# Patient Record
Sex: Female | Born: 1990 | Race: White | Hispanic: No | Marital: Single | Smoking: Never smoker
Health system: Southern US, Community
[De-identification: ages and names within clinical notes are randomized; demographics above are authoritative.]

## PROBLEM LIST (undated history)

## (undated) ENCOUNTER — Inpatient Hospital Stay: Payer: Self-pay

## (undated) DIAGNOSIS — F319 Bipolar disorder, unspecified: Secondary | ICD-10-CM

## (undated) DIAGNOSIS — F419 Anxiety disorder, unspecified: Secondary | ICD-10-CM

## (undated) DIAGNOSIS — O09299 Supervision of pregnancy with other poor reproductive or obstetric history, unspecified trimester: Secondary | ICD-10-CM

## (undated) DIAGNOSIS — R87612 Low grade squamous intraepithelial lesion on cytologic smear of cervix (LGSIL): Secondary | ICD-10-CM

## (undated) DIAGNOSIS — M1990 Unspecified osteoarthritis, unspecified site: Secondary | ICD-10-CM

## (undated) DIAGNOSIS — F32A Depression, unspecified: Secondary | ICD-10-CM

## (undated) DIAGNOSIS — G709 Myoneural disorder, unspecified: Secondary | ICD-10-CM

## (undated) DIAGNOSIS — M419 Scoliosis, unspecified: Secondary | ICD-10-CM

## (undated) DIAGNOSIS — K219 Gastro-esophageal reflux disease without esophagitis: Secondary | ICD-10-CM

## (undated) DIAGNOSIS — O24419 Gestational diabetes mellitus in pregnancy, unspecified control: Secondary | ICD-10-CM

## (undated) HISTORY — DX: Unspecified osteoarthritis, unspecified site: M19.90

## (undated) HISTORY — DX: Supervision of pregnancy with other poor reproductive or obstetric history, unspecified trimester: O09.299

## (undated) HISTORY — DX: Myoneural disorder, unspecified: G70.9

## (undated) HISTORY — DX: Gestational diabetes mellitus in pregnancy, unspecified control: O24.419

## (undated) HISTORY — DX: Depression, unspecified: F32.A

## (undated) HISTORY — PX: ADENOIDECTOMY: SUR15

## (undated) HISTORY — DX: Bipolar disorder, unspecified: F31.9

## (undated) HISTORY — PX: TONSILLECTOMY: SUR1361

## (undated) HISTORY — DX: Anxiety disorder, unspecified: F41.9

## (undated) HISTORY — PX: WISDOM TOOTH EXTRACTION: SHX21

## (undated) HISTORY — PX: SPINE SURGERY: SHX786

---
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
## 2011-05-10 ENCOUNTER — Emergency Department: Payer: Self-pay | Admitting: Internal Medicine

## 2011-05-21 ENCOUNTER — Emergency Department: Payer: Self-pay | Admitting: Internal Medicine

## 2011-06-10 ENCOUNTER — Emergency Department: Payer: Self-pay | Admitting: Unknown Physician Specialty

## 2011-11-16 ENCOUNTER — Observation Stay: Payer: Self-pay | Admitting: Obstetrics and Gynecology

## 2011-11-16 LAB — PIH PROFILE
Anion Gap: 10 (ref 7–16)
Calcium, Total: 8.3 mg/dL — ABNORMAL LOW (ref 9.0–10.7)
Co2: 23 mmol/L (ref 21–32)
EGFR (African American): >60
Glucose: 110 mg/dL — ABNORMAL HIGH (ref 65–99)
HCT: 31.1 % — ABNORMAL LOW (ref 35.0–47.0)
HGB: 11 g/dL — ABNORMAL LOW (ref 12.0–16.0)
MCHC: 35.3 g/dL (ref 32.0–36.0)
Platelet: 184 10*3/uL (ref 150–440)
Potassium: 3.6 mmol/L (ref 3.5–5.1)
RBC: 3.54 10*6/uL — ABNORMAL LOW (ref 3.80–5.20)
SGOT(AST): 19 U/L (ref 0–26)
Sodium: 143 mmol/L (ref 136–145)

## 2011-11-16 LAB — TSH: Thyroid Stimulating Horm: 0.801 u[IU]/mL

## 2011-11-16 LAB — PROTEIN / CREATININE RATIO, URINE
Protein, Random Urine: 7 mg/dL (ref 0–12)
Protein/Creat. Ratio: 402 mg/gCREAT — ABNORMAL HIGH (ref 0–200)

## 2011-11-16 LAB — T4, FREE: Free Thyroxine: 0.84 ng/dL (ref 0.76–1.46)

## 2011-12-24 ENCOUNTER — Observation Stay: Payer: Self-pay

## 2012-01-04 ENCOUNTER — Observation Stay: Payer: Self-pay

## 2012-01-04 LAB — PIH PROFILE
Anion Gap: 9 (ref 7–16)
BUN: 14 mg/dL (ref 7–18)
Chloride: 109 mmol/L — ABNORMAL HIGH (ref 98–107)
Co2: 23 mmol/L (ref 21–32)
MCH: 31.4 pg (ref 26.0–34.0)
MCHC: 35.3 g/dL (ref 32.0–36.0)
MCV: 89 fL (ref 80–100)
Osmolality: 283 (ref 275–301)
Platelet: 178 10*3/uL (ref 150–440)
RBC: 3.88 10*6/uL (ref 3.80–5.20)
SGOT(AST): 23 U/L (ref 0–26)
Uric Acid: 5.2 mg/dL (ref 3.0–5.8)

## 2012-01-04 LAB — PROTEIN / CREATININE RATIO, URINE
Creatinine, Urine: 84.2 mg/dL (ref 30.0–125.0)
Protein, Random Urine: 15 mg/dL — ABNORMAL HIGH (ref 0–12)

## 2012-01-08 ENCOUNTER — Inpatient Hospital Stay: Payer: Self-pay

## 2012-01-09 LAB — CBC WITH DIFFERENTIAL/PLATELET
Basophil %: 0.6 %
Eosinophil #: 0.2 10*3/uL (ref 0.0–0.7)
HCT: 35.5 % (ref 35.0–47.0)
HGB: 12.6 g/dL (ref 12.0–16.0)
Lymphocyte %: 15.2 %
MCH: 31.5 pg (ref 26.0–34.0)
MCHC: 35.6 g/dL (ref 32.0–36.0)
Monocyte #: 1.5 x10 3/mm — ABNORMAL HIGH (ref 0.2–0.9)
Neutrophil #: 10.6 10*3/uL — ABNORMAL HIGH (ref 1.4–6.5)
Platelet: 190 10*3/uL (ref 150–440)
RDW: 14.1 % (ref 11.5–14.5)
WBC: 14.6 10*3/uL — ABNORMAL HIGH (ref 3.6–11.0)

## 2012-02-13 ENCOUNTER — Ambulatory Visit: Payer: Self-pay

## 2012-02-13 LAB — RAPID STREP-A WITH REFLX: Micro Text Report: NEGATIVE

## 2012-02-15 LAB — BETA STREP CULTURE(ARMC)

## 2012-06-06 ENCOUNTER — Emergency Department: Payer: Self-pay | Admitting: Emergency Medicine

## 2012-06-06 LAB — CBC
HCT: 37.9 % (ref 35.0–47.0)
HGB: 13.3 g/dL (ref 12.0–16.0)
MCH: 29.6 pg (ref 26.0–34.0)
MCHC: 35 g/dL (ref 32.0–36.0)
MCV: 85 fL (ref 80–100)
Platelet: 248 10*3/uL (ref 150–440)
RDW: 13.9 % (ref 11.5–14.5)

## 2012-06-06 LAB — COMPREHENSIVE METABOLIC PANEL
Albumin: 3.9 g/dL (ref 3.4–5.0)
Alkaline Phosphatase: 114 U/L (ref 50–136)
Anion Gap: 7 (ref 7–16)
BUN: 14 mg/dL (ref 7–18)
Bilirubin,Total: 0.4 mg/dL (ref 0.2–1.0)
Co2: 25 mmol/L (ref 21–32)
Creatinine: 0.62 mg/dL (ref 0.60–1.30)
EGFR (Non-African Amer.): >60
Osmolality: 280 (ref 275–301)
Potassium: 3.8 mmol/L (ref 3.5–5.1)
Sodium: 140 mmol/L (ref 136–145)

## 2012-06-06 LAB — RAPID INFLUENZA A&B ANTIGENS

## 2012-06-06 LAB — URINALYSIS, COMPLETE
Nitrite: NEGATIVE
Ph: 6 (ref 4.5–8.0)
Protein: 30
Specific Gravity: 1.024 (ref 1.003–1.030)

## 2012-06-06 LAB — LIPASE, BLOOD: Lipase: 105 U/L (ref 73–393)

## 2012-07-04 ENCOUNTER — Emergency Department: Payer: Self-pay | Admitting: Emergency Medicine

## 2012-07-04 LAB — COMPREHENSIVE METABOLIC PANEL
Albumin: 4.2 g/dL (ref 3.4–5.0)
Alkaline Phosphatase: 131 U/L (ref 50–136)
Anion Gap: 8 (ref 7–16)
BUN: 12 mg/dL (ref 7–18)
Bilirubin,Total: 0.3 mg/dL (ref 0.2–1.0)
Co2: 24 mmol/L (ref 21–32)
Creatinine: 0.83 mg/dL (ref 0.60–1.30)
EGFR (African American): >60
EGFR (Non-African Amer.): >60
Osmolality: 281 (ref 275–301)
SGPT (ALT): 30 U/L (ref 12–78)

## 2012-07-04 LAB — CBC
HCT: 38.6 % (ref 35.0–47.0)
HGB: 13.1 g/dL (ref 12.0–16.0)
MCH: 29 pg (ref 26.0–34.0)
MCHC: 33.9 g/dL (ref 32.0–36.0)
Platelet: 216 10*3/uL (ref 150–440)
RBC: 4.51 10*6/uL (ref 3.80–5.20)
WBC: 8.8 10*3/uL (ref 3.6–11.0)

## 2012-12-22 ENCOUNTER — Emergency Department: Payer: Self-pay | Admitting: Emergency Medicine

## 2012-12-22 LAB — URINALYSIS, COMPLETE
Bilirubin,UR: NEGATIVE
Glucose,UR: NEGATIVE mg/dL (ref 0–75)
Ketone: NEGATIVE
Protein: 30
RBC,UR: 10 /HPF (ref 0–5)
Specific Gravity: 1.03 (ref 1.003–1.030)
Squamous Epithelial: 15
WBC UR: 3 /HPF (ref 0–5)

## 2012-12-22 LAB — CBC WITH DIFFERENTIAL/PLATELET
Basophil #: 0.1 10*3/uL (ref 0.0–0.1)
Basophil %: 1 %
Eosinophil #: 0.2 10*3/uL (ref 0.0–0.7)
Eosinophil %: 1.5 %
HCT: 36.1 % (ref 35.0–47.0)
HGB: 12.4 g/dL (ref 12.0–16.0)
Lymphocyte #: 2.3 10*3/uL (ref 1.0–3.6)
Lymphocyte %: 18.3 %
MCH: 29.3 pg (ref 26.0–34.0)
MCHC: 34.5 g/dL (ref 32.0–36.0)
MCV: 85 fL (ref 80–100)
Monocyte #: 1.9 x10 3/mm — ABNORMAL HIGH (ref 0.2–0.9)
Neutrophil #: 8.1 10*3/uL — ABNORMAL HIGH (ref 1.4–6.5)
Neutrophil %: 64.2 %
Platelet: 225 10*3/uL (ref 150–440)
RBC: 4.24 10*6/uL (ref 3.80–5.20)
RDW: 13.4 % (ref 11.5–14.5)

## 2012-12-22 LAB — GC/CHLAMYDIA PROBE AMP

## 2012-12-22 LAB — COMPREHENSIVE METABOLIC PANEL
BUN: 10 mg/dL (ref 7–18)
Calcium, Total: 8.8 mg/dL (ref 8.5–10.1)
Co2: 25 mmol/L (ref 21–32)
Creatinine: 0.77 mg/dL (ref 0.60–1.30)
Potassium: 3.5 mmol/L (ref 3.5–5.1)
SGOT(AST): 17 U/L (ref 15–37)
Sodium: 143 mmol/L (ref 136–145)

## 2012-12-22 LAB — CK TOTAL AND CKMB (NOT AT ARMC): CK-MB: <0.5 ng/mL — ABNORMAL LOW (ref 0.5–3.6)

## 2012-12-24 LAB — BETA STREP CULTURE(ARMC)

## 2013-02-21 DIAGNOSIS — Z87898 Personal history of other specified conditions: Secondary | ICD-10-CM | POA: Insufficient documentation

## 2013-05-01 ENCOUNTER — Ambulatory Visit: Payer: Self-pay | Admitting: Physician Assistant

## 2013-10-12 ENCOUNTER — Observation Stay: Payer: Self-pay | Admitting: Obstetrics & Gynecology

## 2013-10-17 ENCOUNTER — Observation Stay: Payer: Self-pay | Admitting: Obstetrics and Gynecology

## 2013-10-17 LAB — URINALYSIS, COMPLETE
BACTERIA: NONE SEEN
Bilirubin,UR: NEGATIVE
Blood: NEGATIVE
Glucose,UR: NEGATIVE mg/dL (ref 0–75)
Ketone: NEGATIVE
LEUKOCYTE ESTERASE: NEGATIVE
Nitrite: NEGATIVE
PH: 7 (ref 4.5–8.0)
Protein: NEGATIVE
RBC,UR: <1 /HPF (ref 0–5)
Specific Gravity: 1.008 (ref 1.003–1.030)
Squamous Epithelial: <1

## 2013-10-30 ENCOUNTER — Inpatient Hospital Stay: Payer: Self-pay

## 2013-10-30 LAB — CBC WITH DIFFERENTIAL/PLATELET
BASOS PCT: 0.1 %
Basophil #: 0 10*3/uL (ref 0.0–0.1)
EOS ABS: 0 10*3/uL (ref 0.0–0.7)
Eosinophil %: 0.2 %
HCT: 37 % (ref 35.0–47.0)
HGB: 12.4 g/dL (ref 12.0–16.0)
Lymphocyte #: 2.1 10*3/uL (ref 1.0–3.6)
Lymphocyte %: 8.9 %
MCH: 28.6 pg (ref 26.0–34.0)
MCHC: 33.7 g/dL (ref 32.0–36.0)
MCV: 85 fL (ref 80–100)
MONOS PCT: 7.5 %
Monocyte #: 1.8 x10 3/mm — ABNORMAL HIGH (ref 0.2–0.9)
Neutrophil #: 19.6 10*3/uL — ABNORMAL HIGH (ref 1.4–6.5)
Neutrophil %: 83.3 %
Platelet: 214 10*3/uL (ref 150–440)
RBC: 4.35 10*6/uL (ref 3.80–5.20)
RDW: 13.9 % (ref 11.5–14.5)
WBC: 23.6 10*3/uL — AB (ref 3.6–11.0)

## 2013-10-30 LAB — URINALYSIS, COMPLETE
Bacteria: NONE SEEN
Bilirubin,UR: NEGATIVE
GLUCOSE, UR: NEGATIVE mg/dL (ref 0–75)
LEUKOCYTE ESTERASE: NEGATIVE
Nitrite: NEGATIVE
Ph: 5 (ref 4.5–8.0)
RBC,UR: 40 /HPF (ref 0–5)
SPECIFIC GRAVITY: 1.024 (ref 1.003–1.030)
Squamous Epithelial: 3

## 2013-10-30 LAB — GC/CHLAMYDIA PROBE AMP

## 2013-10-31 LAB — URINALYSIS, COMPLETE
Bilirubin,UR: NEGATIVE
Glucose,UR: NEGATIVE mg/dL (ref 0–75)
Leukocyte Esterase: NEGATIVE
Nitrite: NEGATIVE
PH: 6 (ref 4.5–8.0)
Protein: 30
RBC,UR: 19 /HPF (ref 0–5)
SPECIFIC GRAVITY: 1.017 (ref 1.003–1.030)

## 2013-10-31 LAB — CBC WITH DIFFERENTIAL/PLATELET
Basophil #: 0 10*3/uL (ref 0.0–0.1)
Basophil %: 0.1 %
Eosinophil #: 0 10*3/uL (ref 0.0–0.7)
Eosinophil %: 0 %
HCT: 31.2 % — ABNORMAL LOW (ref 35.0–47.0)
HGB: 10.9 g/dL — ABNORMAL LOW (ref 12.0–16.0)
LYMPHS ABS: 2 10*3/uL (ref 1.0–3.6)
Lymphocyte %: 7.5 %
MCH: 29.7 pg (ref 26.0–34.0)
MCHC: 34.8 g/dL (ref 32.0–36.0)
MCV: 86 fL (ref 80–100)
MONO ABS: 2 x10 3/mm — AB (ref 0.2–0.9)
MONOS PCT: 7.4 %
Neutrophil #: 22.8 10*3/uL — ABNORMAL HIGH (ref 1.4–6.5)
Neutrophil %: 85 %
PLATELETS: 191 10*3/uL (ref 150–440)
RBC: 3.65 10*6/uL — AB (ref 3.80–5.20)
RDW: 13.9 % (ref 11.5–14.5)
WBC: 26.9 10*3/uL — ABNORMAL HIGH (ref 3.6–11.0)

## 2013-11-01 LAB — URINE CULTURE

## 2013-11-06 ENCOUNTER — Emergency Department: Payer: Self-pay | Admitting: Emergency Medicine

## 2014-06-27 ENCOUNTER — Ambulatory Visit: Payer: Self-pay | Admitting: Physician Assistant

## 2014-06-27 LAB — RAPID INFLUENZA A&B ANTIGENS

## 2014-07-06 ENCOUNTER — Ambulatory Visit: Payer: Self-pay | Admitting: Physician Assistant

## 2014-07-06 LAB — MONONUCLEOSIS SCREEN: MONO TEST: POSITIVE

## 2014-10-30 NOTE — H&P (Signed)
L&D Evaluation:  History:  HPI 24 year old G2 P1001 with EDC=11/15/2013 by a 6wk 6 day ultrasound presents at 637 5/7 weeks with c/o onset abdominal pain/ctxs/pelvic pain at 2300 last night, followed shortly after that with some lite bleeding and spotting. Her cx on arrival was 1.5 cm and fern test x 2 was negative. Bleeding has subsided and appears to be more of a brown old blood on her pad. Baby active. [facility] for unsure dates as she was/is still breastfeeding her toddler. She received Rhogam (blood type A neg) for an early spotting episode and again at 27 weeks. Treated for sinusits during pregnancy and fro monilia x 2. Was given a RX for Azithromycin last week for a possibleskin infection after getting cut by a broken plate-but did not take. Possibly has a shard under her skin. TDAP 4/13. A neg/RI/VI/GBS negative.   Presents with contractions, vaginal bleeding   Patient's Medical History No Chronic Illness   Patient's Surgical History T&A   Medications Pre Natal Vitamins  Tylenol (Acetaminophen)  folic acid 1 mgm, biotin 1000mcg tid, vitamin D 1000IU daily   Allergies Augmentin (tachycardia), Bactrim DS, cephalexin (rash), cipro, doxycycline (angioedema), PCN.   Social History none   Family History Non-Contributory   ROS:  ROS see HPI   Exam:  Vital Signs stable  123/63   Urine Protein not completed   General complains of pain, but texting   Mental Status clear   Chest clear   Heart normal sinus rhythm, no murmur/gallop/rubs   Abdomen gravid, tender with contractions   Estimated Fetal Weight Average for gestational age, 907 1/2 #-8#   Reflexes 1+   Pelvic no external lesions, 3/75%/-1 deviated to left with BBOW at os at 2:45 PM   Mebranes Intact, fern test neg x 2. AFI=11.9cm   FHT 125-130 with accels to 150, occ variable x 20 sec to 90   FHT Description reactive. Cat 1   Ucx initially q4-13 min,, now q4-5 minutes   Skin dry   Impression:   Impression IUP at 37 5/7 weeks in early labor. Bleeding probably from  cx dilation/ctxs.   Plan:  Plan EFM/NST, monitor contractions and for cervical change, Admit and expectant management for now.   Electronic Signatures: Trinna BalloonGutierrez, Lamont Glasscock L (CNM)  (Signed 11-May-15 18:10)  Authored: L&D Evaluation   Last Updated: 11-May-15 18:10 by Trinna BalloonGutierrez, Carlyn Mullenbach L (CNM)

## 2014-10-30 NOTE — H&P (Signed)
L&D Evaluation:  History:   HPI 24 year old G1 P0 with EDC=01/07/2012 by LMP=04/02/2011 [facility] to office with increased edema in hands/legs and headaches. [facility], spotting in the first trimester, a UTI, an abnormal pap (ASCUS with +HRHPV), A negative blood type (received Rhogam at 28 weeks)    Presents with edema and headaches    Patient's Medical History abnormal Pap, esophageal reflux    Patient's Surgical History T&A    Medications Pre Natal Vitamins  Zantac    Allergies PCN    Social History none    Family History Non-Contributory   ROS:   ROS positive for headaches and edema, negative for RUQ pain, regular contractions, VB, LOF, visual changes   Exam:   Vital Signs stable  116/72, 110/69, 103/55,  93/54, 98/59, 104/55    Urine Protein negative dipstick, pro/cr= 178    General no apparent distress    Mental Status clear    Fetal Position cephalic per Dr Tiburcio PeaHarris    Edema 2+  LE/pedal    Reflexes 2+    Pelvic 1/20/-3 per Dr Sandy SalaamHarris    Mebranes Intact    FHT 125-130 with accels to 150s-160    FHT Description moderate variability    Ucx occasional, mild    Skin dry    Other PIH labs: H&H=12.2/34.5, plt=178K, uric acid=5.2, SGOT=23, BUN=14, cr=0.73,   Impression:   Impression IUP at 39 4/7 weeks with no evidence of preeclampsia. Gestational edema. Reactive NST   Plan:   Plan discharge, home with labor and preeclampsia precautions. FU appt on 7/18 for NST and BP check.   Electronic Signatures: Trinna BalloonGutierrez, Taim Wurm L (CNM)  (Signed 15-Jul-13 15:25)  Authored: L&D Evaluation   Last Updated: 15-Jul-13 15:25 by Trinna BalloonGutierrez, Tanina Barb L (CNM)

## 2014-10-30 NOTE — H&P (Signed)
L&D Evaluation:  History Expanded:   HPI 24 yo G1 whose EDC = 7/15.  [facility].  [facility] of 2 out of 8.    Blood Type (Maternal) A negative    Group B Strep Results Maternal (Result >5wks must be treated as unknown) negative    Maternal HIV Negative    Maternal Syphilis Ab Nonreactive    Maternal Varicella Immune    Rubella Results (Maternal) immune    Presents with low BPP    Patient's Medical History acid reflux    Patient's Surgical History none    Medications Pre Natal Vitamins  ac id refulx meds    Allergies Amox SOB, Tachy, Cipro SOB, rach    Social History none    Family History Non-Contributory   Exam:   General no apparent distress    Chest clear    Heart normal sinus rhythm    Abdomen gravid, non-tender    Estimated Fetal Weight Average for gestational age    Pelvic Cx [osterior, 1 cm, 50%, Vtx   Impression:   Impression Induciton for decreased BPP   Plan:   Comments will observe for now, Cervidil tonight and inductgion tomorrow.  Pt has been fully informed of the pros and cons, risk/benefits continued close observation versus the risks of induction. She understands that there are uncommon risks to induction, which include but are not limited to:  frequent and/or prolonged uterine contractions, fetal distress, uterine rupture and lack of success of induction.  She also has been informed that if the induction is not successful a Cesarean Section may be necessary.  All questions have been answered and she is in agreement with induction.   Electronic Signatures: Towana Badgerosenow, Philip J (MD)  (Signed 19-Jul-13 14:27)  Authored: L&D Evaluation   Last Updated: 19-Jul-13 14:27 by Towana Badgerosenow, Philip J (MD)

## 2014-12-29 ENCOUNTER — Ambulatory Visit: Admission: EM | Admit: 2014-12-29 | Discharge: 2014-12-29 | Discharge status: Absent without leave | Payer: Self-pay

## 2014-12-31 ENCOUNTER — Ambulatory Visit
Admission: EM | Admit: 2014-12-31 | Discharge: 2014-12-31 | Disposition: A | Discharge status: Home / Self Care | Payer: Medicaid Other | Attending: Internal Medicine | Admitting: Internal Medicine

## 2014-12-31 ENCOUNTER — Encounter: Payer: Self-pay | Admitting: Emergency Medicine

## 2014-12-31 DIAGNOSIS — W57XXXA Bitten or stung by nonvenomous insect and other nonvenomous arthropods, initial encounter: Secondary | ICD-10-CM | POA: Insufficient documentation

## 2014-12-31 DIAGNOSIS — M549 Dorsalgia, unspecified: Secondary | ICD-10-CM | POA: Diagnosis present

## 2014-12-31 DIAGNOSIS — M5430 Sciatica, unspecified side: Secondary | ICD-10-CM | POA: Diagnosis not present

## 2014-12-31 DIAGNOSIS — T148 Other injury of unspecified body region: Secondary | ICD-10-CM | POA: Diagnosis present

## 2014-12-31 DIAGNOSIS — R51 Headache: Secondary | ICD-10-CM | POA: Insufficient documentation

## 2014-12-31 DIAGNOSIS — M545 Low back pain: Secondary | ICD-10-CM | POA: Diagnosis not present

## 2014-12-31 DIAGNOSIS — S30861A Insect bite (nonvenomous) of abdominal wall, initial encounter: Secondary | ICD-10-CM | POA: Diagnosis not present

## 2014-12-31 DIAGNOSIS — R519 Headache, unspecified: Secondary | ICD-10-CM

## 2014-12-31 LAB — PREGNANCY, URINE: Preg Test, Ur: NEGATIVE

## 2014-12-31 LAB — URINALYSIS COMPLETE WITH MICROSCOPIC (ARMC ONLY)
Bilirubin Urine: NEGATIVE
GLUCOSE, UA: NEGATIVE mg/dL
KETONES UR: NEGATIVE mg/dL
NITRITE: NEGATIVE
PH: 6 (ref 5.0–8.0)
Protein, ur: NEGATIVE mg/dL
SPECIFIC GRAVITY, URINE: 1.015 (ref 1.005–1.030)

## 2014-12-31 MED ORDER — PROPRANOLOL HCL ER 60 MG PO CP24: 60.0000 mg | ORAL_CAPSULE | Freq: Every day | ORAL | Status: DC

## 2014-12-31 MED ORDER — DICLOFENAC SODIUM 1 % TD GEL: 2.0000 g | Freq: Four times a day (QID) | TRANSDERMAL | Status: AC

## 2014-12-31 MED ORDER — HYDROCORTISONE VALERATE 0.2 % EX OINT: 1.0000 "application " | TOPICAL_OINTMENT | Freq: Two times a day (BID) | CUTANEOUS | Status: DC

## 2014-12-31 NOTE — Discharge Instructions (Signed)
Prescriptions for hydrocortisone ointment (to help with insect bite reaction on R abdomen), propranolol (to help with headaches) and diclofenac gel (for back pain) were sent to the [facility]. Followup with PCP/[facility] in a couple weeks to discuss further evaluation/symptom management.

## 2014-12-31 NOTE — ED Notes (Signed)
Patient c/o insect bite to abdomen since Thursday.  Patient also c/o lower back pain for 2 weeks.

## 2014-12-31 NOTE — ED Provider Notes (Signed)
CSN: [id number]     Arrival date & time 12/31/14  1018 History   First MD Initiated Contact with Patient 12/31/14 1114     Chief Complaint  Patient presents with  . Insect Bite  . Back Pain  HPI  Patient is a 24 year old lady with 2 small children. She presents today with an itchy and somewhat sore insect bite on her right abdomen for the last 4 days. She also reports a couple weeks history of bilateral lower lumbar back discomfort, and daily headaches for the last 6 or 7 months. She is getting minimal relief of the headaches and back pain from Tylenol. No weakness or clumsiness in the legs, or arms. Change in bowel or bladder function. No reports of urinary frequency or dysuria. Afebrile. Her PCP will be [facility].  History reviewed. No pertinent past medical history. Past Surgical History  Procedure Laterality Date  . Tonsillectomy    . Adenoidectomy     History reviewed. No pertinent family history. History  Substance Use Topics  . Smoking status: Never Smoker   . Smokeless tobacco: Never Used  . Alcohol Use: No    Review of Systems  All other systems reviewed and are negative.   Allergies  Sulfa antibiotics and Penicillins  Home Medications  Prenatal vitamin with iron; zyrtec; prn tylenol    BP 121/78 mmHg  Pulse 93  Temp(Src) 97.8 F (36.6 C) (Tympanic)  Resp 16  Ht 5\' 3"  (1.6 m)  Wt 150 lb (68.04 kg)  BMI 26.58 kg/m2  SpO2 100% Physical Exam  Constitutional: She is oriented to person, place, and time. No distress.  Alert, nicely groomed Very active in the exam room, corraling her small children. Her children appear to be about ages 1 and 4; she lifts the 24-year-old frequently, easily.  HENT:  Head: Atraumatic.  Eyes:  Conjugate gaze, no eye redness/drainage  Neck: Neck supple.  Cardiovascular: Normal rate.   Pulmonary/Chest: No respiratory distress.  Abdominal: She exhibits no distension.  Musculoskeletal: Normal range of motion.       Arms: No leg  swelling Strength in the bilateral lower extremities, proximal and distal, is 5/5 and symmetric. She lifts the infant at least 3 times from the floor level to her hip and from the floor level to chest level in front of her, using both arms and without favoring either one. She has mild discomfort to palpation in the left paralumbar muscles, and the bilateral paralumbar muscles have mild spasm palpable. No rash, no focal erythema/swelling, no bruise.  Neurological: She is alert and oriented to person, place, and time.  Gait is steady, no staggering, no balance issues. Face is symmetric, speech is clear and coherent.  Skin: Skin is warm and dry.  No cyanosis There is a smooth minimally indurated red/blanchable patch in the right abdomen, 1.5 inches across, within eccentric punctum. Not tender. Not fluctuant.  Patient felt the bite when it occurred, but did not witness what bit her. No history of tick bite.  Nursing note and vitals reviewed.   ED Course  Procedures  Results for orders placed or performed during the hospital encounter of 12/31/14  Urinalysis complete, with microscopic  Result Value Ref Range   Color, Urine YELLOW YELLOW   APPearance HAZY (A) CLEAR   Glucose, UA NEGATIVE NEGATIVE mg/dL   Bilirubin Urine NEGATIVE NEGATIVE   Ketones, ur NEGATIVE NEGATIVE mg/dL   Specific Gravity, Urine 1.015 1.005 - 1.030   Hgb urine dipstick 2+ (A) NEGATIVE  pH 6.0 5.0 - 8.0   Protein, ur NEGATIVE NEGATIVE mg/dL   Nitrite NEGATIVE NEGATIVE   Leukocytes, UA 1+ (A) NEGATIVE   RBC / HPF 0-5 <3 RBC/hpf   WBC, UA 0-5 <3 WBC/hpf   Bacteria, UA RARE RARE   Squamous Epithelial / LPF 6-30 (A) RARE   Budding Yeast PRESENT   Pregnancy, urine  Result Value Ref Range   Preg Test, Ur NEGATIVE NEGATIVE   Urinalysis at the urgent care demonstrates some yeast and some squamous cells; does not appear to be an explanation for the back pain.  MDM   1. Chronic daily headache   2. Insect bite of  abdomen with local reaction, initial encounter   3. Bilateral low back pain, with sciatica presence unspecified    Prescription for propranolol 60 mg daily to try for chronic daily headache; further evaluation/med titration pending an appointment with her PCP [facility].  Prescription for topical Westcort ointment for the insect bite local reaction on the abdomen. Prescription for diclofenac gel topically for the back pain, which I believe to be musculoskeletal. Recheck as needed    Eustace Moore, MD 12/31/14 1200

## 2015-03-30 ENCOUNTER — Emergency Department
Admission: EM | Admit: 2015-03-30 | Discharge: 2015-03-30 | Disposition: A | Discharge status: Home / Self Care | Payer: Medicaid Other | Attending: Emergency Medicine | Admitting: Emergency Medicine

## 2015-03-30 ENCOUNTER — Encounter: Payer: Self-pay | Admitting: Emergency Medicine

## 2015-03-30 ENCOUNTER — Emergency Department: Payer: Medicaid Other

## 2015-03-30 DIAGNOSIS — Z7952 Long term (current) use of systemic steroids: Secondary | ICD-10-CM | POA: Insufficient documentation

## 2015-03-30 DIAGNOSIS — Z79899 Other long term (current) drug therapy: Secondary | ICD-10-CM | POA: Diagnosis not present

## 2015-03-30 DIAGNOSIS — Z3202 Encounter for pregnancy test, result negative: Secondary | ICD-10-CM | POA: Insufficient documentation

## 2015-03-30 DIAGNOSIS — R1011 Right upper quadrant pain: Secondary | ICD-10-CM | POA: Diagnosis not present

## 2015-03-30 DIAGNOSIS — M545 Low back pain: Secondary | ICD-10-CM | POA: Diagnosis not present

## 2015-03-30 DIAGNOSIS — Z880 Allergy status to penicillin: Secondary | ICD-10-CM | POA: Diagnosis not present

## 2015-03-30 DIAGNOSIS — G8929 Other chronic pain: Secondary | ICD-10-CM | POA: Insufficient documentation

## 2015-03-30 DIAGNOSIS — R109 Unspecified abdominal pain: Secondary | ICD-10-CM

## 2015-03-30 LAB — URINALYSIS COMPLETE WITH MICROSCOPIC (ARMC ONLY)
Bilirubin Urine: NEGATIVE
Glucose, UA: NEGATIVE mg/dL
KETONES UR: NEGATIVE mg/dL
Nitrite: NEGATIVE
PH: 6 (ref 5.0–8.0)
Protein, ur: NEGATIVE mg/dL
Specific Gravity, Urine: 1.025 (ref 1.005–1.030)

## 2015-03-30 LAB — CBC
HCT: 39.3 % (ref 35.0–47.0)
Hemoglobin: 13.7 g/dL (ref 12.0–16.0)
MCH: 29.6 pg (ref 26.0–34.0)
MCHC: 34.8 g/dL (ref 32.0–36.0)
MCV: 85.1 fL (ref 80.0–100.0)
Platelets: 287 10*3/uL (ref 150–440)
RBC: 4.61 MIL/uL (ref 3.80–5.20)
RDW: 13.2 % (ref 11.5–14.5)
WBC: 7.6 10*3/uL (ref 3.6–11.0)

## 2015-03-30 LAB — COMPREHENSIVE METABOLIC PANEL
ALT: 19 U/L (ref 14–54)
AST: 24 U/L (ref 15–41)
Albumin: 4.4 g/dL (ref 3.5–5.0)
Alkaline Phosphatase: 63 U/L (ref 38–126)
Anion gap: 6 (ref 5–15)
BILIRUBIN TOTAL: 0.8 mg/dL (ref 0.3–1.2)
BUN: 17 mg/dL (ref 6–20)
CALCIUM: 9 mg/dL (ref 8.9–10.3)
CO2: 26 mmol/L (ref 22–32)
CREATININE: 0.74 mg/dL (ref 0.44–1.00)
Chloride: 108 mmol/L (ref 101–111)
Glucose, Bld: 102 mg/dL — ABNORMAL HIGH (ref 65–99)
Potassium: 4.3 mmol/L (ref 3.5–5.1)
SODIUM: 140 mmol/L (ref 135–145)
Total Protein: 7.3 g/dL (ref 6.5–8.1)

## 2015-03-30 LAB — POCT PREGNANCY, URINE: Preg Test, Ur: NEGATIVE

## 2015-03-30 LAB — LIPASE, BLOOD: Lipase: 29 U/L (ref 22–51)

## 2015-03-30 NOTE — Discharge Instructions (Signed)
Please continue antibiotic as prescribed. Follow-up with your doctor early this week. Return to the emergency room right away if you develop severe pain, a fever, vomiting, any blood in stool, feel dehydrated, or other new concerns arise.  Abdominal Pain, Adult Many things can cause abdominal pain. Usually, abdominal pain is not caused by a disease and will improve without treatment. It can often be observed and treated at home. Your health care provider will do a physical exam and possibly order blood tests and X-rays to help determine the seriousness of your pain. However, in many cases, more time must pass before a clear cause of the pain can be found. Before that point, your health care provider may not know if you need more testing or further treatment. HOME CARE INSTRUCTIONS Monitor your abdominal pain for any changes. The following actions may help to alleviate any discomfort you are experiencing:  Only take over-the-counter or prescription medicines as directed by your health care provider.  Do not take laxatives unless directed to do so by your health care provider.  Try a clear liquid diet (broth, tea, or water) as directed by your health care provider. Slowly move to a bland diet as tolerated. SEEK MEDICAL CARE IF:  You have unexplained abdominal pain.  You have abdominal pain associated with nausea or diarrhea.  You have pain when you urinate or have a bowel movement.  You experience abdominal pain that wakes you in the night.  You have abdominal pain that is worsened or improved by eating food.  You have abdominal pain that is worsened with eating fatty foods.  You have a fever. SEEK IMMEDIATE MEDICAL CARE IF:  Your pain does not go away within 2 hours.  You keep throwing up (vomiting).  Your pain is felt only in portions of the abdomen, such as the right side or the left lower portion of the abdomen.  You pass bloody or black tarry stools. MAKE SURE  YOU:  Understand these instructions.  Will watch your condition.  Will get help right away if you are not doing well or get worse.   This information is not intended to replace advice given to you by your health care provider. Make sure you discuss any questions you have with your health care provider.   Document Released: 03/18/2005 Document Revised: 02/27/2015 Document Reviewed: 02/15/2013 Elsevier Interactive Patient Education [employer]

## 2015-03-30 NOTE — ED Provider Notes (Addendum)
[facility] Emergency Department Provider Note REMINDER - THIS NOTE IS NOT A FINAL MEDICAL RECORD UNTIL IT IS SIGNED. UNTIL THEN, THE CONTENT BELOW MAY REFLECT INFORMATION FROM A DOCUMENTATION TEMPLATE, NOT THE ACTUAL PATIENT VISIT. ____________________________________________  Time seen: Approximately 12:18 PM  I have reviewed the triage vital signs and the nursing notes.   HISTORY  Chief Complaint Abdominal Pain    HPI Alyssa Kemp is a 24 y.o. female ports no significant medical history aside from 2 previous births. Also history gastric reflux.  The patient states for about the last 2 weeks she has noticed discomfort in the right upper abdomen, noticed more when she is out on her typical daily walks. Not associated with any chest pain or trouble breathing. No shortness of breath.  She reports a aching discomfort that is worse with walking primarily in the right upper abdomen, and occasionally moves to the right flank. She saw her primary care doctor who advised obtaining ultrasound, but did not wish for her to wait until Wednesday to have this done to evaluate for gallbladder.  No lower abdominal pain, no right lower quadrant pain, no pelvic pain, no vaginal bleeding or discharge.  Pain is relieved with Tylenol or ibuprofen at times. No fevers or chills.  Primary care doctor was suspicious for possible urinary tract infection and placed her on Cipro which she started yesterday for 3 day course. Patient denies urinary symptoms, though she was told there is a slight amount of blood in her urine at her doctor's office.  History reviewed. No pertinent past medical history.  There are no active problems to display for this patient.   Past Surgical History  Procedure Laterality Date  . Tonsillectomy    . Adenoidectomy    . Wisdom tooth extraction      Current Outpatient Rx  Name  Route  Sig  Dispense  Refill  . cetirizine (ZYRTEC) 10 MG tablet  Oral   Take 10 mg by mouth daily.         . hydrocortisone valerate ointment (WESTCORT) 0.2 %   Topical   Apply 1 application topically 2 (two) times daily.   45 g   0   . Prenatal Vit-Fe Fumarate-FA (PRENATAL VITAMIN PO)   Oral   Take 1 tablet by mouth daily.         . propranolol ER (INDERAL LA) 60 MG 24 hr capsule   Oral   Take 1 capsule (60 mg total) by mouth at bedtime. To decrease frequency/severity of headaches.   30 capsule   0     Allergies Sulfa antibiotics and Penicillins  History reviewed. No pertinent family history.  Social History Social History  Substance Use Topics  . Smoking status: Never Smoker   . Smokeless tobacco: Never Used  . Alcohol Use: No    Review of Systems Constitutional: No fever/chills Eyes: No visual changes. ENT: No sore throat. Cardiovascular: Denies chest pain. Respiratory: Denies shortness of breath. Gastrointestinal: No nausea, no vomiting.  No diarrhea.  No constipation. Genitourinary: Negative for dysuria. Musculoskeletal: Negative for back pain, except for chronic lower back stiffness which she reports having had since an epidural in the past about 2 years ago. Skin: Negative for rash. Neurological: Negative for headaches, focal weakness or numbness.  10-point ROS otherwise negative.  No chest pain, no leg swelling, no recent surgeries or hospitalizations. Nonsmoker.   ____________________________________________   PHYSICAL EXAM:  VITAL SIGNS: ED Triage Vitals  Enc Vitals Group  BP 03/30/15 0934 110/61 mmHg     Pulse Rate 03/30/15 0934 73     Resp 03/30/15 0934 16     Temp 03/30/15 0934 98 F (36.7 C)     Temp Source 03/30/15 0934 Oral     SpO2 03/30/15 0934 98 %     Weight 03/30/15 0934 160 lb (72.576 kg)     Height 03/30/15 0934  (1.6 m)     Head Cir --      Peak Flow --      Pain Score 03/30/15 0940 6     Pain Loc --      Pain Edu? --      Excl. in GC? --    Constitutional: Alert and  oriented. Well appearing and in no acute distress. Patient is very amicable. She is speaking on her cell phone on entry to the room. Eyes: Conjunctivae are normal. PERRL. EOMI. Head: Atraumatic. Nose: No congestion/rhinnorhea. Mouth/Throat: Mucous membranes are moist.  Oropharynx non-erythematous. Neck: No stridor.   Cardiovascular: Normal rate, regular rhythm. Grossly normal heart sounds.  Good peripheral circulation. Respiratory: Normal respiratory effort.  No retractions. Lungs CTAB. Gastrointestinal: Soft and nontender to for some mild discomfort to palpation in the deep right flank and right upper quadrant. She does express some slight increase in pain with Eulah Pont. No pain at McBurney's point. No rosving. No rebound or guarding. No distention. No abdominal bruits. No CVA tenderness. Musculoskeletal: No lower extremity tenderness nor edema.  No joint effusions. No lower extremity edema. Neurologic:  Normal speech and language. No gross focal neurologic deficits are appreciated. Skin:  Skin is warm, dry and intact. No rash noted. Psychiatric: Mood and affect are normal. Speech and behavior are normal.  ____________________________________________   LABS (all labs ordered are listed, but only abnormal results are displayed)  Labs Reviewed  COMPREHENSIVE METABOLIC PANEL - Abnormal; Notable for the following:    Glucose, Bld 102 (*)    All other components within normal limits  URINALYSIS COMPLETEWITH MICROSCOPIC ([facility] ONLY) - Abnormal; Notable for the following:    Color, Urine YELLOW (*)    APPearance CLEAR (*)    Hgb urine dipstick 2+ (*)    Leukocytes, UA TRACE (*)    Bacteria, UA RARE (*)    Squamous Epithelial / LPF 0-5 (*)    All other components within normal limits  LIPASE, BLOOD  CBC  POC URINE PREG, ED  POCT PREGNANCY, URINE   ____________________________________________  EKG   ____________________________________________  RADIOLOGY  US Abdomen Limited RUQ  (Final result) Result time: 03/30/15 13:54:10   Final result by Rad Results In Interface (03/30/15 13:54:10)   Narrative:   CLINICAL DATA: Right upper quadrant abdominal pain for 2 weeks.  EXAM: US ABDOMEN LIMITED - RIGHT UPPER QUADRANT  COMPARISON: None.  FINDINGS: Gallbladder:  No gallstones or wall thickening visualized. No sonographic Murphy sign noted.  Common bile duct:  Diameter: 2.4 mm which is within normal limits.  Liver:  No focal lesion identified. Within normal limits in parenchymal echogenicity.  IMPRESSION: No abnormality seen in the right upper quadrant of the abdomen.   Electronically Signed By: Lupita Raider, M.D. On: 03/30/2015 13:54          US Renal (Final result) Result time: 03/30/15 13:50:51   Final result by Rad Results In Interface (03/30/15 13:50:51)   Narrative:   CLINICAL DATA: Right flank pain and discomfort, 2 weeks duration.  EXAM: RENAL / URINARY TRACT ULTRASOUND COMPLETE  COMPARISON: 03/25/2010  FINDINGS: Right Kidney:  Length: 11.7 cm. Echogenicity within normal limits. No mass or hydronephrosis visualized.  Left Kidney:  Length: 11.2 cm. Echogenicity within normal limits. No mass or hydronephrosis visualized.  Bladder:  Appears normal for degree of bladder distention.  IMPRESSION: Normal examination.   Electronically Signed By: Paulina Fusi M.D. On: 03/30/2015 13:50    ____________________________________________   PROCEDURES  Procedure(s) performed: None  Critical Care performed: No  ____________________________________________   INITIAL IMPRESSION / ASSESSMENT AND PLAN / ED COURSE  Pertinent labs & imaging results that were available during my care of the patient were reviewed by me and considered in my medical decision making (see chart for details).  Patient presents with 2 weeks of discomfort in the right upper abdomen. She does have some mild focality of tenderness  located on the right flank.   Patient is a some slight amount of blood in her urine, and based on her right flank discomfort I will obtain ultrasound right upper quadrant and kidneys. She has no evidence of acute abdomen. I do not believe that CT imaging is warranted at this time.  ----------------------------------------- 2:07 PM on 03/30/2015 -----------------------------------------  The patient reports that she feels quite well right now, she states that she is just very hungry. She has not eaten since about 2 in the morning as she was waiting to have an ultrasound and was told she did wait 8 hours before having it. I will discharge her home and reviewed careful return precautions. She has a primary care doctor whom she can follow up closely with. ____________________________________________   FINAL CLINICAL IMPRESSION(S) / ED DIAGNOSES  Final diagnoses:  Right flank discomfort      Sharyn Creamer, MD 03/30/15 1407  Sharyn Creamer, MD 03/30/15 1410

## 2015-03-30 NOTE — ED Notes (Signed)
Pt C/O tenderness and soreness to the RUQ of her abdomen. Pt states she was seen and had bloodwork and urine done on Wednesday and they told her she needed an [place] of her gallbladder. Pt presents in NAD. Denies N/V/D. Pain recreated with palpation. Pt states pain with exercise at this time.

## 2015-04-10 ENCOUNTER — Ambulatory Visit: Payer: Medicaid Other | Attending: Nurse Practitioner | Admitting: Physical Therapy

## 2015-04-10 DIAGNOSIS — G8929 Other chronic pain: Secondary | ICD-10-CM

## 2015-04-10 DIAGNOSIS — M5441 Lumbago with sciatica, right side: Secondary | ICD-10-CM | POA: Diagnosis not present

## 2015-04-10 DIAGNOSIS — R262 Difficulty in walking, not elsewhere classified: Secondary | ICD-10-CM

## 2015-04-11 ENCOUNTER — Ambulatory Visit: Payer: Medicaid Other | Admitting: Physical Therapy

## 2015-04-11 ENCOUNTER — Encounter: Payer: Self-pay | Admitting: Physical Therapy

## 2015-04-11 NOTE — Therapy (Signed)
[facility] [address] [place], [place], [address] Phone: [phone]   Fax:  [phone]  Physical Therapy Evaluation  Patient Details  Name: Alyssa Kemp MRN: [id number] Date of Birth: 1990/07/07 Referring Provider: Imelda Pillow, PA-C  Encounter Date: 04/10/2015      PT End of Session - 04/11/15 0719    Visit Number 1   Number of Visits 1   Authorization - Visit Number 1   Authorization - Number of Visits 1   PT Start Time 1112   PT Stop Time 1145   PT Time Calculation (min) 33 min   Activity Tolerance Patient tolerated treatment well   Behavior During Therapy [facility] for tasks assessed/performed      No past medical history on file.  Past Surgical History  Procedure Laterality Date  . Tonsillectomy    . Adenoidectomy    . Wisdom tooth extraction      There were no vitals filed for this visit.  Visit Diagnosis:  Chronic right-sided low back pain with right-sided sciatica  Difficulty walking      Subjective Assessment - 04/11/15 0711    Subjective Pt reports low back pain with R sided radiating numbness. Pt states this has been ongoing since her epidural with her first child (2013). Pt reports being told she had scolosis when she received her epidural. Pt has tried ice and heat with no relief but reports her pain is better with movement. Pt reports pain at rest at a 5/10. Pt stays active by walking 3/3.5 miles a day.    Limitations Sitting   How long can you sit comfortably? < 15 mins   How long can you stand comfortably? >30 min   How long can you walk comfortably? > 30 mins   Patient Stated Goals walk without pain/play with her kids without pain   Currently in Pain? Yes   Pain Score 5    Pain Location Back   Pain Orientation Lower   Pain Descriptors / Indicators Aching   Pain Type Chronic pain   Pain Onset More than a month ago   Pain Frequency Constant            [facility] PT Assessment - 04/11/15  0001    Assessment   Medical Diagnosis lumbago with sciatic nerve flare up   Referring Provider Imelda Pillow, PA-C   Onset Date/Surgical Date 06/23/11       OBJECTIVE: ODI: 18%, self-perceived minimal disability Lumbar AROM in standing: flexion 39 deg, extension 24 deg both pain limited There ex: performed beginning of TrA core stability packet (pt demonstrates good technique and is able to perform straight leg raise and bicycle, increased pain with marching). Prone press ups x 10. Extension in standing x 10. Pt educated on proper stretching techniques for R piriformis to decrease numbness and tingling. Pt educated on proper lifting mechanics to ensure safety and decrease risk of injury while picking her children up from the ground.         PT Education - 04/11/15 0717    Education provided Yes   Education Details Pt given stretching for piriformis and ITB in order to increase flexibility where sciatic nerve is. Pt given neural glides and core stability program to progress and preent a flare up in the future.    Person(s) Educated Patient   Methods Explanation;Demonstration;Handout   Comprehension Verbalized understanding;Need further instruction           Plan - 04/11/15  [id number]    Clinical Impression Statement Pt is a 24 y.o F who is referred for lumbago and R sided sciatia. Pt reports pain at rest at a 5/10 that is aggrevated with sitting and increases to a 8/10. Pt reports relief with walking and currently walks 3 miles a day. Pt is unable to be MMT due to guarding against movement and fearful of pain. Pt lumbar AROM is limited by pain, flexion is 39 deg in standing and extension is 24 deg in standing.  Pt is hypersenstive to palpation in her R piriformis and R paraspinals. Pt has R ITB tightness. Pt is limited overall by guarding and fearful of pain with movement.    Pt will benefit from skilled therapeutic intervention in order to improve on the following deficits Decreased  activity tolerance;Improper body mechanics;Postural dysfunction;Pain;Impaired perceived functional ability;Difficulty walking   Rehab Potential Good   PT Frequency 1x / week   PT Treatment/Interventions Therapeutic exercise;Manual techniques;Functional mobility training;Therapeutic activities;Stair training;Gait training;ADLs/Self Care Home Management;Patient/family education   PT Home Exercise Plan see handout. pt encouraged to stay active. given core stability to keep pt active   Recommended Other Services continue current walking program.   Consulted and Agree with Plan of Care Patient         Problem List There are no active problems to display for this patient.   Alyssa Kemp, SPT 04/11/2015, 9:25 AM  [facility] [address] [place], [place], [address] Phone: [phone]   Fax:  [phone]  Name: Alyssa Kemp MRN: [id number] Date of Birth: 08/06/1990

## 2015-09-12 ENCOUNTER — Emergency Department
Admission: EM | Admit: 2015-09-12 | Discharge: 2015-09-12 | Disposition: A | Discharge status: Home / Self Care | Payer: Medicaid Other | Attending: Emergency Medicine | Admitting: Emergency Medicine

## 2015-09-12 ENCOUNTER — Encounter: Payer: Self-pay | Admitting: Emergency Medicine

## 2015-09-12 DIAGNOSIS — Z79899 Other long term (current) drug therapy: Secondary | ICD-10-CM | POA: Diagnosis not present

## 2015-09-12 DIAGNOSIS — Z880 Allergy status to penicillin: Secondary | ICD-10-CM | POA: Insufficient documentation

## 2015-09-12 DIAGNOSIS — M5416 Radiculopathy, lumbar region: Secondary | ICD-10-CM

## 2015-09-12 DIAGNOSIS — G8929 Other chronic pain: Secondary | ICD-10-CM | POA: Insufficient documentation

## 2015-09-12 DIAGNOSIS — M545 Low back pain: Secondary | ICD-10-CM | POA: Diagnosis present

## 2015-09-12 HISTORY — DX: Scoliosis, unspecified: M41.9

## 2015-09-12 MED ORDER — OXYCODONE-ACETAMINOPHEN 7.5-325 MG PO TABS: 1.0000 | ORAL_TABLET | Freq: Four times a day (QID) | ORAL | Status: DC | PRN

## 2015-09-12 MED ORDER — KETOROLAC TROMETHAMINE 60 MG/2ML IM SOLN
30.0000 mg | Freq: Once | INTRAMUSCULAR | Status: AC
  Administered 2015-09-12: 30 mg via INTRAMUSCULAR
  Filled 2015-09-12: qty 2

## 2015-09-12 NOTE — ED Notes (Signed)
Pt to ed with c/o right leg pain and lower back pain x several years.  Pt states worse recently.  Denies new injury.

## 2015-09-12 NOTE — ED Notes (Signed)
See triage  Hx of back pain for several years..states increased pain in back   Pt is pacing and tearful  Denies any recent injury

## 2015-09-12 NOTE — ED Provider Notes (Signed)
[facility] Emergency Department Provider Note  ____________________________________________  Time seen: Approximately 11:31 AM  I have reviewed the triage vital signs and the nursing notes.   HISTORY  Chief Complaint Back Pain    HPI Alyssa Kemp is a 25 y.o. female  is complaining of chronic back pain status post epidural she had the first pregnancy. Patient states she is now scheduled for MRI on April 28. Patient states her treating [facility] in [facility] lidocaine patches and when she went to pick up she was told she needed preauthorization. Patient went back to her family doctor and was told preauthorization would not be authorized because the lidocaine patches were a narcotic medication. Patient stated after talked to the pharmacy she will be changing family doctors. Patient stated this radicular component to this back pain to the right leg. Patient denies any bladder or bowel dysfunction. Patient is currently taking Flexeril. Patient rates her pain as a 9/10. Past Medical History  Diagnosis Date  . Scoliosis     There are no active problems to display for this patient.   Past Surgical History  Procedure Laterality Date  . Tonsillectomy    . Adenoidectomy    . Wisdom tooth extraction      Current Outpatient Rx  Name  Route  Sig  Dispense  Refill  . Biotin (PA BIOTIN) 1000 MCG tablet   Oral   Take 1,000 mcg by mouth daily.         . hydrocortisone valerate ointment (WESTCORT) 0.2 %   Topical   Apply 1 application topically 2 (two) times daily.   45 g   0   . magnesium oxide (MAG-OX) 400 MG tablet   Oral   Take 400 mg by mouth daily.         Marland Kitchen. oxyCODONE-acetaminophen (PERCOCET) 7.5-325 MG tablet   Oral   Take 1 tablet by mouth every 6 (six) hours as needed for severe pain.   12 tablet   0   . Prenatal Vit-Fe Fumarate-FA (PRENATAL VITAMIN PO)   Oral   Take 1 tablet by mouth daily.         . propranolol  ER (INDERAL LA) 60 MG 24 hr capsule   Oral   Take 1 capsule (60 mg total) by mouth at bedtime. To decrease frequency/severity of headaches.   30 capsule   0     Allergies Sulfa antibiotics and Penicillins  History reviewed. No pertinent family history.  Social History Social History  Substance Use Topics  . Smoking status: Never Smoker   . Smokeless tobacco: Never Used  . Alcohol Use: No    Review of Systems Constitutional: No fever/chills Eyes: No visual changes. ENT: No sore throat. Cardiovascular: Denies chest pain. Respiratory: Denies shortness of breath. Gastrointestinal: No abdominal pain.  No nausea, no vomiting.  No diarrhea.  No constipation. Genitourinary: Negative for dysuria. Musculoskeletal: Chronic back pain  Skin: Negative for rash. Neurological: Negative for headaches, focal weakness or numbness.    ____________________________________________   PHYSICAL EXAM:  VITAL SIGNS: ED Triage Vitals  Enc Vitals Group     BP 09/12/15 1035 128/76 mmHg     Pulse Rate 09/12/15 1035 105     Resp 09/12/15 1035 18     Temp 09/12/15 1035 97.9 F (36.6 C)     Temp Source 09/12/15 1035 Oral     SpO2 09/12/15 1035 100 %     Weight 09/12/15 1035  160 lb (72.576 kg)     Height --      Head Cir --      Peak Flow --      Pain Score 09/12/15 1036 9     Pain Loc --      Pain Edu? --      Excl. in GC? --     Constitutional: Alert and oriented. Well appearing and in no acute distress. Eyes: Conjunctivae are normal. PERRL. EOMI. Head: Atraumatic. Nose: No congestion/rhinnorhea. Mouth/Throat: Mucous membranes are moist.  Oropharynx non-erythematous. Neck: No stridor.  No cervical spine tenderness to palpation. Hematological/Lymphatic/Immunilogical: No cervical lymphadenopathy. Cardiovascular: Normal rate, regular rhythm. Grossly normal heart sounds.  Good peripheral circulation. Respiratory: Normal respiratory effort.  No retractions. Lungs  CTAB. Gastrointestinal: Soft and nontender. No distention. No abdominal bruits. No CVA tenderness. Musculoskeletal: No lower extremity tenderness nor edema.  No joint effusions. Neurologic:  Normal speech and language. No gross focal neurologic deficits are appreciated. No gait instability. Skin:  Skin is warm, dry and intact. No rash noted. Psychiatric: Mood and affect are normal. Speech and behavior are normal.  ____________________________________________   LABS (all labs ordered are listed, but only abnormal results are displayed)  Labs Reviewed - No data to display ____________________________________________  EKG   ____________________________________________  RADIOLOGY   ____________________________________________   PROCEDURES  Procedure(s) performed: None  Critical Care performed: No  ____________________________________________   INITIAL IMPRESSION / ASSESSMENT AND PLAN / ED COURSE  Pertinent labs & imaging results that were available during my care of the patient were reviewed by me and considered in my medical decision making (see chart for details).  Chronic radicular back pain. Patient given discharge care instructions. Patient given 30 mg of Toradol IM in the ER. Given a prescription for Percocet and advised to discuss with her family doctor authorization for lidocaine chills. ____________________________________________   FINAL CLINICAL IMPRESSION(S) / ED DIAGNOSES  Final diagnoses:  Acute radicular low back pain      Joni Reining, PA-C 09/12/15 1155

## 2015-10-04 ENCOUNTER — Emergency Department
Admission: EM | Admit: 2015-10-04 | Discharge: 2015-10-04 | Disposition: A | Discharge status: Home / Self Care | Payer: Medicaid Other | Attending: Emergency Medicine | Admitting: Emergency Medicine

## 2015-10-04 ENCOUNTER — Encounter: Payer: Self-pay | Admitting: Emergency Medicine

## 2015-10-04 DIAGNOSIS — M5431 Sciatica, right side: Secondary | ICD-10-CM | POA: Insufficient documentation

## 2015-10-04 DIAGNOSIS — N939 Abnormal uterine and vaginal bleeding, unspecified: Secondary | ICD-10-CM | POA: Diagnosis not present

## 2015-10-04 LAB — COMPREHENSIVE METABOLIC PANEL
ALBUMIN: 4.5 g/dL (ref 3.5–5.0)
ALT: 21 U/L (ref 14–54)
ANION GAP: 3 — AB (ref 5–15)
AST: 24 U/L (ref 15–41)
Alkaline Phosphatase: 45 U/L (ref 38–126)
BILIRUBIN TOTAL: 0.9 mg/dL (ref 0.3–1.2)
BUN: 22 mg/dL — AB (ref 6–20)
CHLORIDE: 108 mmol/L (ref 101–111)
CO2: 25 mmol/L (ref 22–32)
Calcium: 9.1 mg/dL (ref 8.9–10.3)
Creatinine, Ser: 0.64 mg/dL (ref 0.44–1.00)
GFR calc Af Amer: >60 mL/min (ref >60)
GFR calc non Af Amer: >60 mL/min (ref >60)
GLUCOSE: 89 mg/dL (ref 65–99)
POTASSIUM: 3.8 mmol/L (ref 3.5–5.1)
SODIUM: 136 mmol/L (ref 135–145)
TOTAL PROTEIN: 7.1 g/dL (ref 6.5–8.1)

## 2015-10-04 LAB — CBC
HCT: 38.9 % (ref 35.0–47.0)
Hemoglobin: 13.8 g/dL (ref 12.0–16.0)
MCH: 30.8 pg (ref 26.0–34.0)
MCHC: 35.4 g/dL (ref 32.0–36.0)
MCV: 86.9 fL (ref 80.0–100.0)
Platelets: 258 10*3/uL (ref 150–440)
RBC: 4.48 MIL/uL (ref 3.80–5.20)
RDW: 14.5 % (ref 11.5–14.5)
WBC: 8.3 10*3/uL (ref 3.6–11.0)

## 2015-10-04 LAB — URINALYSIS COMPLETE WITH MICROSCOPIC (ARMC ONLY)
Bilirubin Urine: NEGATIVE
Glucose, UA: NEGATIVE mg/dL
Ketones, ur: NEGATIVE mg/dL
LEUKOCYTES UA: NEGATIVE
Nitrite: NEGATIVE
PH: 7 (ref 5.0–8.0)
PROTEIN: NEGATIVE mg/dL
SPECIFIC GRAVITY, URINE: 1.014 (ref 1.005–1.030)

## 2015-10-04 LAB — POCT PREGNANCY, URINE: PREG TEST UR: NEGATIVE

## 2015-10-04 MED ORDER — KETOROLAC TROMETHAMINE 30 MG/ML IJ SOLN
30.0000 mg | Freq: Once | INTRAMUSCULAR | Status: AC
  Administered 2015-10-04: 30 mg via INTRAMUSCULAR
  Filled 2015-10-04: qty 1

## 2015-10-04 NOTE — ED Notes (Addendum)
Pt reports abnormal vaginal bleeding; reports LMP was 3/17; took plan B on 3/26. Pt reports she started bleeding again on 4/2, tested positive for chlamydia on 4/3 and bled until 4/5. Pt reports she started bleeding again on 4/10 and is still bleeding. Pt also asking for "shot" for scoliosis and pinched nerve in back.

## 2015-10-04 NOTE — ED Provider Notes (Signed)
----------------------------------------- 12:01 PM on 10/04/2015 -----------------------------------------   [facility] Emergency Department Provider Note  ____________________________________________  Time seen: Approximately 12:01 PM  I have reviewed the triage vital signs and the nursing notes.   HISTORY  Chief Complaint Vaginal Bleeding    HPI Alyssa Kemp is a 25 y.o. female and presents for evaluation of vaginal bleeding. The patient tells me that she's had intermittent vaginal bleeding, 2-3 pads per day off and on for about the last 2 months. Also over the last 2 months she's had used Plan B twice due to unprotected intercourse.  She also was diagnosed with chlamydia recently, she got treated for that. She has not had any fevers, abdominal or pelvic pain, white or pussy discharge, and denies pregnancy. She reports she is having bleeding consistent with about a. 2-3 pads per day for the last 4-5 days, and previous to that she had similar bleeding a few weeks prior.  She also reports that she has chronic pain in her right lower back which seemed by MRI in a week. She continues to have this pain that shoots down the back of her right leg and spine diagnosis possible sciatica due to her scoliosis. She is requesting a shot of Toradol for this as this did provide her some relief. She has prescriptions for lidocaine, was recently treated with Percocet for the same. No new weakness numbness or tingling. No bowel or bladder problems. Denies any abdominal pain.     Past Medical History  Diagnosis Date  . Scoliosis     There are no active problems to display for this patient.   Past Surgical History  Procedure Laterality Date  . Tonsillectomy    . Adenoidectomy    . Wisdom tooth extraction      Current Outpatient Rx  Name  Route  Sig  Dispense  Refill  . Biotin (PA BIOTIN) 1000 MCG tablet   Oral   Take 1,000 mcg by mouth daily.         .  hydrocortisone valerate ointment (WESTCORT) 0.2 %   Topical   Apply 1 application topically 2 (two) times daily.   45 g   0   . magnesium oxide (MAG-OX) 400 MG tablet   Oral   Take 400 mg by mouth daily.         Marland Kitchen oxyCODONE-acetaminophen (PERCOCET) 7.5-325 MG tablet   Oral   Take 1 tablet by mouth every 6 (six) hours as needed for severe pain.   12 tablet   0   . Prenatal Vit-Fe Fumarate-FA (PRENATAL VITAMIN PO)   Oral   Take 1 tablet by mouth daily.         . propranolol ER (INDERAL LA) 60 MG 24 hr capsule   Oral   Take 1 capsule (60 mg total) by mouth at bedtime. To decrease frequency/severity of headaches.   30 capsule   0     Allergies Sulfa antibiotics and Penicillins  No family history on file.  Social History Social History  Substance Use Topics  . Smoking status: Never Smoker   . Smokeless tobacco: Never Used  . Alcohol Use: No    Review of Systems Constitutional: No fever/chills Eyes: No visual changes. ENT: No sore throat. Cardiovascular: Denies chest pain. Respiratory: Denies shortness of breath. Gastrointestinal: No abdominal pain.  No nausea, no vomiting.  No diarrhea.  No constipation. Genitourinary: Negative for dysuria. Musculoskeletal: See history of present illness, Skin: Negative for rash. Neurological: Negative  for headaches, focal weakness or numbness.  10-point ROS otherwise negative.  ____________________________________________   PHYSICAL EXAM:  VITAL SIGNS: ED Triage Vitals  Enc Vitals Group     BP 10/04/15 0936 137/69 mmHg     Pulse Rate 10/04/15 0936 83     Resp 10/04/15 0936 16     Temp 10/04/15 0936 97.9 F (36.6 C)     Temp Source 10/04/15 0936 Oral     SpO2 10/04/15 0936 98 %     Weight 10/04/15 0936 153 lb (69.4 kg)     Height 10/04/15 0936  (1.626 m)     Head Cir --      Peak Flow --      Pain Score 10/04/15 0937 8     Pain Loc --      Pain Edu? --      Excl. in GC? --    Constitutional: Alert and  oriented. Well appearing and in no acute distress. Eyes: Conjunctivae are normal. PERRL. EOMI. Head: Atraumatic. Nose: No congestion/rhinnorhea. Mouth/Throat: Mucous membranes are moist.  Oropharynx non-erythematous. Neck: No stridor.   Cardiovascular: Normal rate, regular rhythm. Grossly normal heart sounds.  Good peripheral circulation. Respiratory: Normal respiratory effort.  No retractions. Lungs CTAB. Gastrointestinal: Soft and nontender. No distention. No tenderness to deep palpation in the pelvis. No abdominal bruits. No CVA tenderness. Musculoskeletal: No lower extremity tenderness nor edema. Patient does have tenderness over the sciatic notch on the right and also some paravertebral tenderness over the right lower back. No lesion.   Lower Extremities  No edema. Normal DP/PT pulses bilateral with good cap refill.  Normal neuro-motor function lower extremities bilateral.  RIGHT Right lower extremity demonstrates normal strength, good use of all muscles. No edema bruising or contusions of the right hip, right knee, right ankle. Full range of motion of the right lower extremity without pain. No pain on axial loading. No evidence of trauma.  LEFT Left lower extremity demonstrates normal strength, good use of all muscles. No edema bruising or contusions of the hip,  knee, ankle. Full range of motion of the left lower extremity without pain. No pain on axial loading. No evidence of trauma.   Neurologic:  Normal speech and language. No gross focal neurologic deficits are appreciated. No gait instability. Normal dorsalis pedis pulses bilateral. Skin:  Skin is warm, dry and intact. No rash noted. Psychiatric: Mood and affect are normal. Speech and behavior are normal.  ____________________________________________   LABS (all labs ordered are listed, but only abnormal results are displayed)  Labs Reviewed  URINALYSIS COMPLETEWITH MICROSCOPIC ([facility] ONLY) - Abnormal; Notable for the  following:    Color, Urine YELLOW (*)    APPearance CLEAR (*)    Hgb urine dipstick 2+ (*)    Bacteria, UA RARE (*)    Squamous Epithelial / LPF 0-5 (*)    All other components within normal limits  COMPREHENSIVE METABOLIC PANEL - Abnormal; Notable for the following:    BUN 22 (*)    Anion gap 3 (*)    All other components within normal limits  CBC  POC URINE PREG, ED  POCT PREGNANCY, URINE   ____________________________________________  EKG   ____________________________________________  RADIOLOGY   ____________________________________________   PROCEDURES  Procedure(s) performed: None  Critical Care performed: No  ____________________________________________   INITIAL IMPRESSION / ASSESSMENT AND PLAN / ED COURSE  Pertinent labs & imaging results that were available during my care of the patient were reviewed by me and  considered in my medical decision making (see chart for details).  Patient presents for evaluation of intermittent vaginal bleeding. She does tell me that she cannot take estrogens because they cause her some side effects, she has been using Plan B a couple times the last months. She is not pregnant today, her CBC is normal with normal hemoglobin. She does not complain of any severe acute bleeding. She denies any abdominal or pelvic pain but does have her chronic right lower back pain. No evidence or symptoms suggest cauda equina, or acute need for emergent back imaging. No back pain and red flags. No fever, no IV drug abuse, no history of cancer. She has an MRI scheduled for about a week and a half from now for same.  Discussed pain control options with the patient, we will give her shot Toradol here which is helped in the past. She reports she is taken steroid tapers twice recently and not really received any relief. She did report the Percocet helped her and she is run out, however I discussed her pain policy with her I cannot provide her Percocet  prescription at this time. I did refer her and recommend she go to gynecology for further follow-up or vaginal discharge. No signs or symptoms of infection. Did offer evaluation with ultrasound to evaluate for cause of bleeding, but she reports she's had this before mental she has endometriosis. After discussing benefits including evaluation for ovarian cyst, etc., I highly doubt any acute intra-abdominal process, she has no pain, no evidence of torsion, the symptoms of the intermittent bleeding for months.  Hip feel she is stable and appropriate for outpatient follow-up with gynecology. Patient agreeable.  Return precautions and treatment recommendations and follow-up discussed with the patient who is agreeable with the plan.  ____________________________________________   FINAL CLINICAL IMPRESSION(S) / ED DIAGNOSES  Final diagnoses:  Vagina bleeding  Sciatica, right side      Sharyn CreamerMark Avaleigh Decuir, MD 10/04/15 1210

## 2015-10-04 NOTE — ED Notes (Addendum)
Patient states that she has had some abnormal vaginal bleeding. She has taken the Plan B pill twice in the past month and has had bleeding 3 times in the last month. Patient states that she feels weak.  Patient is also c/o LBP with radiation into the right leg. Patient states that she was seen here in our ED recently and given a shot for this and would like to have a shot of the same medication today.   Patient recently diagnosed with Chlamydia and treated with azithromycin

## 2015-10-04 NOTE — Discharge Instructions (Signed)
You have been seen in the Emergency Department (ED)  today for back pain.  Your workup and exam have not shown any acute abnormalities and you are likely suffering from muscle strain or possible problems with your discs. Please take Motrin (ibuprofen) as needed for your pain according to the instructions written on the box.  Alternatively, for the next five days you can take 600mg  three times daily with meals (it may upset your stomach).  Please follow up with your doctor as soon as possible regarding today's ED visit and your back pain.  Return to the ED for worsening back pain, fever, weakness or numbness of either leg, or if you develop either (1) an inability to urinate or have bowel movements, or (2) loss of your ability to control your bathroom functions (if you start having "accidents"), or if you develop other new symptoms that concern you.   Dysfunctional Uterine Bleeding Dysfunctional uterine bleeding is abnormal bleeding from the uterus. Dysfunctional uterine bleeding includes:  A period that comes earlier or later than usual.  A period that is lighter, heavier, or has blood clots.  Bleeding between periods.  Skipping one or more periods.  Bleeding after sexual intercourse.  Bleeding after menopause. HOME CARE INSTRUCTIONS  Pay attention to any changes in your symptoms. Follow these instructions to help with your condition: Eating  Eat well-balanced meals. Include foods that are high in iron, such as liver, meat, shellfish, green leafy vegetables, and eggs.  If you become constipated:  Drink plenty of water.  Eat fruits and vegetables that are high in water and fiber, such as spinach, carrots, raspberries, apples, and mango. Medicines  Take over-the-counter and prescription medicines only as told by your health care provider.  Do not change medicines without talking with your health care provider.  Aspirin or medicines that contain aspirin may make the bleeding worse.  Do not take those medicines:  During the week before your period.  During your period.  If you were prescribed iron pills, take them as told by your health care provider. Iron pills help to replace iron that your body loses because of this condition. Activity  If you need to change your sanitary pad or tampon more than one time every 2 hours:  Lie in bed with your feet raised (elevated).  Place a cold pack on your lower abdomen.  Rest as much as possible until the bleeding stops or slows down.  Do not try to lose weight until the bleeding has stopped and your blood iron level is back to normal. Other Instructions  For two months, write down:  When your period starts.  When your period ends.  When any abnormal bleeding occurs.  What problems you notice.  Keep all follow up visits as told by your health care provider. This is important. SEEK MEDICAL CARE IF:  You get light-headed or weak.  You have nausea and vomiting.  You cannot eat or drink without vomiting.  You feel dizzy or have diarrhea while you are taking medicines.  You are taking birth control pills or hormones, and you want to change them or stop taking them. SEEK IMMEDIATE MEDICAL CARE IF:  You develop a fever or chills.  You need to change your sanitary pad or tampon more than one time per hour.  Your bleeding becomes heavier, or your flow contains clots more often.  You develop pain in your abdomen.  You lose consciousness.  You develop a rash.   This information is  not intended to replace advice given to you by your health care provider. Make sure you discuss any questions you have with your health care provider.   Document Released: 06/05/2000 Document Revised: 02/27/2015 Document Reviewed: 09/03/2014 Elsevier Interactive Patient Education 2016 [employer]  Sciatica Sciatica is pain, weakness, numbness, or tingling along the path of the sciatic nerve. The nerve starts in the lower back  and runs down the back of each leg. The nerve controls the muscles in the lower leg and in the back of the knee, while also providing sensation to the back of the thigh, lower leg, and the sole of your foot. Sciatica is a symptom of another medical condition. For instance, nerve damage or certain conditions, such as a herniated disk or bone spur on the spine, pinch or put pressure on the sciatic nerve. This causes the pain, weakness, or other sensations normally associated with sciatica. Generally, sciatica only affects one side of the body. CAUSES   Herniated or slipped disc.  Degenerative disk disease.  A pain disorder involving the narrow muscle in the buttocks (piriformis syndrome).  Pelvic injury or fracture.  Pregnancy.  Tumor (rare). SYMPTOMS  Symptoms can vary from mild to very severe. The symptoms usually travel from the low back to the buttocks and down the back of the leg. Symptoms can include:  Mild tingling or dull aches in the lower back, leg, or hip.  Numbness in the back of the calf or sole of the foot.  Burning sensations in the lower back, leg, or hip.  Sharp pains in the lower back, leg, or hip.  Leg weakness.  Severe back pain inhibiting movement. These symptoms may get worse with coughing, sneezing, laughing, or prolonged sitting or standing. Also, being overweight may worsen symptoms. DIAGNOSIS  Your caregiver will perform a physical exam to look for common symptoms of sciatica. He or she may ask you to do certain movements or activities that would trigger sciatic nerve pain. Other tests may be performed to find the cause of the sciatica. These may include:  Blood tests.  X-rays.  Imaging tests, such as an MRI or CT scan. TREATMENT  Treatment is directed at the cause of the sciatic pain. Sometimes, treatment is not necessary and the pain and discomfort goes away on its own. If treatment is needed, your caregiver may suggest:  Over-the-counter medicines to  relieve pain.  Prescription medicines, such as anti-inflammatory medicine, muscle relaxants, or narcotics.  Applying heat or ice to the painful area.  Steroid injections to lessen pain, irritation, and inflammation around the nerve.  Reducing activity during periods of pain.  Exercising and stretching to strengthen your abdomen and improve flexibility of your spine. Your caregiver may suggest losing weight if the extra weight makes the back pain worse.  Physical therapy.  Surgery to eliminate what is pressing or pinching the nerve, such as a bone spur or part of a herniated disk. HOME CARE INSTRUCTIONS   Only take over-the-counter or prescription medicines for pain or discomfort as directed by your caregiver.  Apply ice to the affected area for 20 minutes, 3-4 times a day for the first 48-72 hours. Then try heat in the same way.  Exercise, stretch, or perform your usual activities if these do not aggravate your pain.  Attend physical therapy sessions as directed by your caregiver.  Keep all follow-up appointments as directed by your caregiver.  Do not wear high heels or shoes that do not provide proper support.  Check your mattress to see if it is too soft. A firm mattress may lessen your pain and discomfort. SEEK IMMEDIATE MEDICAL CARE IF:   You lose control of your bowel or bladder (incontinence).  You have increasing weakness in the lower back, pelvis, buttocks, or legs.  You have redness or swelling of your back.  You have a burning sensation when you urinate.  You have pain that gets worse when you lie down or awakens you at night.  Your pain is worse than you have experienced in the past.  Your pain is lasting longer than 4 weeks.  You are suddenly losing weight without reason. MAKE SURE YOU:  Understand these instructions.  Will watch your condition.  Will get help right away if you are not doing well or get worse.   This information is not intended to  replace advice given to you by your health care provider. Make sure you discuss any questions you have with your health care provider.   Document Released: 06/02/2001 Document Revised: 02/27/2015 Document Reviewed: 10/18/2011 Elsevier Interactive Patient Education [employer]

## 2015-10-17 ENCOUNTER — Encounter: Payer: Self-pay | Admitting: Emergency Medicine

## 2015-10-17 ENCOUNTER — Emergency Department
Admission: EM | Admit: 2015-10-17 | Discharge: 2015-10-17 | Disposition: A | Discharge status: Home / Self Care | Payer: Medicaid Other | Attending: Emergency Medicine | Admitting: Emergency Medicine

## 2015-10-17 DIAGNOSIS — M419 Scoliosis, unspecified: Secondary | ICD-10-CM | POA: Diagnosis not present

## 2015-10-17 DIAGNOSIS — J020 Streptococcal pharyngitis: Secondary | ICD-10-CM | POA: Diagnosis not present

## 2015-10-17 DIAGNOSIS — Z79899 Other long term (current) drug therapy: Secondary | ICD-10-CM | POA: Diagnosis not present

## 2015-10-17 DIAGNOSIS — J029 Acute pharyngitis, unspecified: Secondary | ICD-10-CM | POA: Diagnosis present

## 2015-10-17 DIAGNOSIS — J069 Acute upper respiratory infection, unspecified: Secondary | ICD-10-CM

## 2015-10-17 LAB — POCT RAPID STREP A: Streptococcus, Group A Screen (Direct): POSITIVE — AB

## 2015-10-17 MED ORDER — AZITHROMYCIN 250 MG PO TABS: ORAL_TABLET | ORAL | Status: DC

## 2015-10-17 MED ORDER — LORATADINE 10 MG PO TABS: 10.0000 mg | ORAL_TABLET | Freq: Every day | ORAL | Status: DC

## 2015-10-17 MED ORDER — MAGIC MOUTHWASH W/LIDOCAINE: 5.0000 mL | Freq: Four times a day (QID) | ORAL | Status: DC | PRN

## 2015-10-17 MED ORDER — PROMETHAZINE-DM 6.25-15 MG/5ML PO SYRP: 5.0000 mL | ORAL_SOLUTION | Freq: Four times a day (QID) | ORAL | Status: DC | PRN

## 2015-10-17 NOTE — Discharge Instructions (Signed)
Cool Mist Vaporizers Vaporizers may help relieve the symptoms of a cough and cold. They add moisture to the air, which helps mucus to become thinner and less sticky. This makes it easier to breathe and cough up secretions. Cool mist vaporizers do not cause serious burns like hot mist vaporizers, which may also be called steamers or humidifiers. Vaporizers have not been proven to help with colds. You should not use a vaporizer if you are allergic to mold. HOME CARE INSTRUCTIONS  Follow the package instructions for the vaporizer.  Do not use anything other than distilled water in the vaporizer.  Do not run the vaporizer all of the time. This can cause mold or bacteria to grow in the vaporizer.  Clean the vaporizer after each time it is used.  Clean and dry the vaporizer well before storing it.  Stop using the vaporizer if worsening respiratory symptoms develop.   This information is not intended to replace advice given to you by your health care provider. Make sure you discuss any questions you have with your health care provider.   Document Released: 03/05/2004 Document Revised: 06/13/2013 Document Reviewed: 10/26/2012 Elsevier Interactive Patient Education 2016 Else[employer] Inc.   Viral Infections A virus is a type of germ. Viruses can cause:  Minor sore throats.  Aches and pains.  Headaches.  Runny nose.  Rashes.  Watery eyes.  Tiredness.  Coughs.  Loss of appetite.  Feeling sick to your stomach (nausea).  Throwing up (vomiting).  Watery poop (diarrhea). HOME CARE   Only take medicines as told by your doctor.  Drink enough water and fluids to keep your pee (urine) clear or pale yellow. Sports drinks are a good choice.  Get plenty of rest and eat healthy. Soups and broths with crackers or rice are fine. GET HELP RIGHT AWAY IF:   You have a very bad headache.  You have shortness of breath.  You have chest pain or neck pain.  You have an unusual rash.  You  cannot stop throwing up.  You have watery poop that does not stop.  You cannot keep fluids down.  You or your child has a temperature by mouth above 102 F (38.9 C), not controlled by medicine.  Your baby is older than 3 months with a rectal temperature of 102 F (38.9 C) or higher.  Your baby is 62 months old or younger with a rectal temperature of 100.4 F (38 C) or higher. MAKE SURE YOU:   Understand these instructions.  Will watch this condition.  Will get help right away if you are not doing well or get worse.   This information is not intended to replace advice given to you by your health care provider. Make sure you discuss any questions you have with your health care provider.   Document Released: 05/21/2008 Document Revised: 08/31/2011 Document Reviewed: 11/14/2014 Elsevier Interactive Patient Education 2016 Elsevier Inc.  Strep Throat Strep throat is a bacterial infection of the throat. Your health care provider may call the infection tonsillitis or pharyngitis, depending on whether there is swelling in the tonsils or at the back of the throat. Strep throat is most common during the cold months of the year in children who are 101-63 years of age, but it can happen during any season in people of any age. This infection is spread from person to person (contagious) through coughing, sneezing, or close contact. CAUSES Strep throat is caused by the bacteria called Streptococcus pyogenes. RISK FACTORS This condition is  more likely to develop in:  People who spend time in crowded places where the infection can spread easily.  People who have close contact with someone who has strep throat. SYMPTOMS Symptoms of this condition include:  Fever or chills.   Redness, swelling, or pain in the tonsils or throat.  Pain or difficulty when swallowing.  White or yellow spots on the tonsils or throat.  Swollen, tender glands in the neck or under the jaw.  Red rash all over the  body (rare). DIAGNOSIS This condition is diagnosed by performing a rapid strep test or by taking a swab of your throat (throat culture test). Results from a rapid strep test are usually ready in a few minutes, but throat culture test results are available after one or two days. TREATMENT This condition is treated with antibiotic medicine. HOME CARE INSTRUCTIONS Medicines  Take over-the-counter and prescription medicines only as told by your health care provider.  Take your antibiotic as told by your health care provider. Do not stop taking the antibiotic even if you start to feel better.  Have family members who also have a sore throat or fever tested for strep throat. They may need antibiotics if they have the strep infection. Eating and Drinking  Do not share food, drinking cups, or personal items that could cause the infection to spread to other people.  If swallowing is difficult, try eating soft foods until your sore throat feels better.  Drink enough fluid to keep your urine clear or pale yellow. General Instructions  Gargle with a salt-water mixture 3-4 times per day or as needed. To make a salt-water mixture, completely dissolve -1 tsp of salt in 1 cup of warm water.  Make sure that all household members wash their hands well.  Get plenty of rest.  Stay home from school or work until you have been taking antibiotics for 24 hours.  Keep all follow-up visits as told by your health care provider. This is important. SEEK MEDICAL CARE IF:  The glands in your neck continue to get bigger.  You develop a rash, cough, or earache.  You cough up a thick liquid that is green, yellow-brown, or bloody.  You have pain or discomfort that does not get better with medicine.  Your problems seem to be getting worse rather than better.  You have a fever. SEEK IMMEDIATE MEDICAL CARE IF:  You have new symptoms, such as vomiting, severe headache, stiff or painful neck, chest pain, or  shortness of breath.  You have severe throat pain, drooling, or changes in your voice.  You have swelling of the neck, or the skin on the neck becomes red and tender.  You have signs of dehydration, such as fatigue, dry mouth, and decreased urination.  You become increasingly sleepy, or you cannot wake up completely.  Your joints become red or painful.   This information is not intended to replace advice given to you by your health care provider. Make sure you discuss any questions you have with your health care provider.   Document Released: 06/05/2000 Document Revised: 02/27/2015 Document Reviewed: 10/01/2014 Elsevier Interactive Patient Education [employer] Inc.

## 2015-10-17 NOTE — ED Provider Notes (Signed)
[facility] Emergency Department Provider Note  ____________________________________________  Time seen: Approximately 5:14 PM  I have reviewed the triage vital signs and the nursing notes.   HISTORY  Chief Complaint URI    HPI Alyssa Kemp is a 25 y.o. female , NAD, presents to the emergency department with four-day history of cold symptoms. States she has had nasal congestion, runny nose, ear pressure, sore throat for 4 days. Has not taken anything over-the-counter for current symptoms. Denies any fever, chills, body aches. Has not any abdominal pain, nausea, vomiting. Denies headache or neck pain. No known sick contacts. States she did look in her throat today and saw some redness.   Past Medical History  Diagnosis Date  . Scoliosis     There are no active problems to display for this patient.   Past Surgical History  Procedure Laterality Date  . Tonsillectomy    . Adenoidectomy    . Wisdom tooth extraction      Current Outpatient Rx  Name  Route  Sig  Dispense  Refill  . FLUoxetine (PROZAC) 20 MG capsule   Oral   Take 20 mg by mouth daily.         Marland Kitchen. azithromycin (ZITHROMAX Z-PAK) 250 MG tablet      Take 2 tablets (500 mg) on  Day 1,  followed by 1 tablet (250 mg) once daily on Days 2 through 5.   6 each   0   . Biotin (PA BIOTIN) 1000 MCG tablet   Oral   Take 1,000 mcg by mouth daily.         Marland Kitchen. loratadine (CLARITIN) 10 MG tablet   Oral   Take 1 tablet (10 mg total) by mouth daily.   30 tablet   0   . magic mouthwash w/lidocaine SOLN   Oral   Take 5 mLs by mouth 4 (four) times daily as needed for mouth pain.   240 mL   0     Please mix 80mL diphenhydramine, 80mL nystatin, 80 ...   . magnesium oxide (MAG-OX) 400 MG tablet   Oral   Take 400 mg by mouth daily.         . promethazine-dextromethorphan (PROMETHAZINE-DM) 6.25-15 MG/5ML syrup   Oral   Take 5 mLs by mouth 4 (four) times daily as needed for cough.   118  mL   0     Allergies Sulfa antibiotics and Penicillins  No family history on file.  Social History Social History  Substance Use Topics  . Smoking status: Never Smoker   . Smokeless tobacco: Never Used  . Alcohol Use: No     Review of Systems  Constitutional: No fever/chills, fatigue Eyes: No visual changes. No discharge, redness, swelling ENT: Positive nasal congestion, runny nose, ear pressure, sore throat. Cardiovascular: No chest pain. Respiratory: No cough, chest congestion. No shortness of breath. No wheezing.  Gastrointestinal: No abdominal pain.  No nausea, vomiting.   Musculoskeletal: Negative for neck pain.  Skin: Negative for rash. Neurological: Negative for headaches, focal weakness or numbness. 10-point ROS otherwise negative.  ____________________________________________   PHYSICAL EXAM:  VITAL SIGNS: ED Triage Vitals  Enc Vitals Group     BP 10/17/15 1709 111/65 mmHg     Pulse Rate 10/17/15 1709 94     Resp 10/17/15 1709 18     Temp 10/17/15 1709 98.1 F (36.7 C)     Temp Source 10/17/15 1709 Oral     SpO2 10/17/15  1709 99 %     Weight 10/17/15 1709 155 lb (70.308 kg)     Height 10/17/15 1709  (1.6 m)     Head Cir --      Peak Flow --      Pain Score --      Pain Loc --      Pain Edu? --      Excl. in GC? --      Constitutional: Alert and oriented. Well appearing and in no acute distress. Eyes: Conjunctivae are normal. PERRL. EOMI without pain.  Head: Atraumatic. ENT:      Ears: TMs visualized bilaterally with mild serous effusion but no bulging, perforation, erythema.      Nose: Moderate congestion with trace clear rhinnorhea.      Mouth/Throat: Mucous membranes are moist. Pharynx with mild injection but no overt erythema. No swelling or exudate about the pharynx. Uvula is midline. Neck: No stridor. Supple with full range of motion. No meningismus. Hematological/Lymphatic/Immunilogical: No cervical lymphadenopathy. Cardiovascular:  Normal rate, regular rhythm. Normal S1 and S2.   Respiratory: Normal respiratory effort without tachypnea or retractions. Lungs CTAB with breath sounds noted in all lung fields. Neurologic:  Normal speech and language. No gross focal neurologic deficits are appreciated.  Skin:  Skin is warm, dry and intact. No rash noted. Psychiatric: Mood and affect are normal. Speech and behavior are normal. Patient exhibits appropriate insight and judgement.   ____________________________________________   LABS (all labs ordered are listed, but only abnormal results are displayed)  Labs Reviewed  POCT RAPID STREP A - Abnormal; Notable for the following:    Streptococcus, Group A Screen (Direct) POSITIVE (*)    All other components within normal limits   ____________________________________________  EKG  None ____________________________________________  RADIOLOGY  None ____________________________________________    PROCEDURES  Procedure(s) performed: None    Medications - No data to display   ____________________________________________   INITIAL IMPRESSION / ASSESSMENT AND PLAN / ED COURSE  Pertinent lab results that were available during my care of the patient were reviewed by me and considered in my medical decision making (see chart for details).  Patient's diagnosis is consistent with strep pharyngitis and URI. Patient will be discharged home with prescriptions for azithromycin, Magic mouthwash, loratadine, promethazine DM syrup to take as directed. Patient is to follow up with [facility] before the [facility] community clinic if symptoms persist past this treatment course. Patient is given ED precautions to return to the ED for any worsening or new symptoms.      ____________________________________________  FINAL CLINICAL IMPRESSION(S) / ED DIAGNOSES  Final diagnoses:  Strep pharyngitis  Upper respiratory infection      NEW MEDICATIONS STARTED  DURING THIS VISIT:  New Prescriptions   AZITHROMYCIN (ZITHROMAX Z-PAK) 250 MG TABLET    Take 2 tablets (500 mg) on  Day 1,  followed by 1 tablet (250 mg) once daily on Days 2 through 5.   LORATADINE (CLARITIN) 10 MG TABLET    Take 1 tablet (10 mg total) by mouth daily.   MAGIC MOUTHWASH W/LIDOCAINE SOLN    Take 5 mLs by mouth 4 (four) times daily as needed for mouth pain.   PROMETHAZINE-DEXTROMETHORPHAN (PROMETHAZINE-DM) 6.25-15 MG/5ML SYRUP    Take 5 mLs by mouth 4 (four) times daily as needed for cough.         Hope Pigeon, PA-C 10/17/15 1738  Jeanmarie Plant, MD 10/17/15 2007

## 2015-10-17 NOTE — ED Notes (Signed)
Sore throat for 1-2 days   Increased pain with swallowing

## 2015-11-05 DIAGNOSIS — M5441 Lumbago with sciatica, right side: Secondary | ICD-10-CM

## 2015-11-05 DIAGNOSIS — G8929 Other chronic pain: Secondary | ICD-10-CM | POA: Insufficient documentation

## 2015-11-05 DIAGNOSIS — Z8619 Personal history of other infectious and parasitic diseases: Secondary | ICD-10-CM | POA: Insufficient documentation

## 2015-11-05 DIAGNOSIS — F411 Generalized anxiety disorder: Secondary | ICD-10-CM | POA: Insufficient documentation

## 2015-11-18 ENCOUNTER — Ambulatory Visit
Admission: EM | Admit: 2015-11-18 | Discharge: 2015-11-18 | Disposition: A | Discharge status: Home / Self Care | Payer: Medicaid Other | Attending: Family Medicine | Admitting: Family Medicine

## 2015-11-18 ENCOUNTER — Encounter: Payer: Self-pay | Admitting: Emergency Medicine

## 2015-11-18 DIAGNOSIS — N390 Urinary tract infection, site not specified: Secondary | ICD-10-CM | POA: Diagnosis not present

## 2015-11-18 DIAGNOSIS — R350 Frequency of micturition: Secondary | ICD-10-CM | POA: Diagnosis present

## 2015-11-18 LAB — URINALYSIS COMPLETE WITH MICROSCOPIC (ARMC ONLY)
BILIRUBIN URINE: NEGATIVE
Glucose, UA: NEGATIVE mg/dL
Ketones, ur: NEGATIVE mg/dL
NITRITE: NEGATIVE
PROTEIN: NEGATIVE mg/dL
Specific Gravity, Urine: 1.015 (ref 1.005–1.030)
pH: 5.5 (ref 5.0–8.0)

## 2015-11-18 LAB — PREGNANCY, URINE: PREG TEST UR: NEGATIVE

## 2015-11-18 MED ORDER — NITROFURANTOIN MONOHYD MACRO 100 MG PO CAPS: 100.0000 mg | ORAL_CAPSULE | Freq: Two times a day (BID) | ORAL | Status: DC

## 2015-11-18 MED ORDER — PHENAZOPYRIDINE HCL 100 MG PO TABS: 200.0000 mg | ORAL_TABLET | Freq: Three times a day (TID) | ORAL | Status: DC | PRN

## 2015-11-18 NOTE — ED Notes (Signed)
Pt reports she took a Plan B pill about 2 months ago and unknown date of last period.

## 2015-11-18 NOTE — ED Notes (Signed)
Pt asked how much longer , informed lab results are up and doctor should be in soon. Pt states she needs to go pick up kids soon.

## 2015-11-18 NOTE — ED Notes (Signed)
Pt reports since Friday, frequent urinary, pain, abdominal cramps. Pt denies fever or chills. Reports chronic back pain bilateral lower back.  Last had yeast and bacterial vaginosis and treated for both.

## 2015-11-18 NOTE — ED Provider Notes (Signed)
[facility]  ____________________________________________  Time seen: Approximately 1:37 PM  I have reviewed the triage vital signs and the nursing notes.   HISTORY  Chief Complaint Urinary Frequency   HPI Alyssa Kemp is a 25 y.o. female presents for the complaints of urinary frequency, urinary urgency, some lower back pain for a few days. Also, reports some burning with urination. States concerned she may have a urinary tract infection. Denies known triggers. Reports continues to eat and drink well. Denies nausea, vomiting, diarrhea or fevers. Reports occasionally has some lower abdominal cramping but denies abdominal pain and denies current cramping. Denies vaginal complaints, vaginal discharge, vaginal odor or concerns of STDs. States has some chronic low back pain similar to what she is currently experiencing, denies fall or trauma.   Denies recent sickness. Denies chest pain, shortness of breath, abdominal pain, dizziness, extremity pain, extremity swelling, rash or recent sickness. Reports has remained active. Reports last menstrual was about 3 months ago, states unsure last exact date of menstrual but states her menstruals are often irregular, and states she did take a Plan B pill about 2 months ago. States she is not concerned about pregnancy.     Past Medical History  Diagnosis Date  . Scoliosis     There are no active problems to display for this patient.   Past Surgical History  Procedure Laterality Date  . Tonsillectomy    . Adenoidectomy    . Wisdom tooth extraction      Current Outpatient Rx  Name  Route  Sig  Dispense  Refill  . cetirizine (ZYRTEC) 10 MG tablet   Oral   Take 10 mg by mouth daily.         .            . sertraline (ZOLOFT) 50 MG tablet   Oral   Take 50 mg by mouth daily.         .            . Biotin (PA BIOTIN) 1000 MCG tablet   Oral   Take 1,000 mcg by mouth daily.         .            . FLUoxetine (PROZAC)  20 MG capsule   Oral   Take 20 mg by mouth daily.         Marland Kitchen loratadine (CLARITIN) 10 MG tablet   Oral   Take 1 tablet (10 mg total) by mouth daily.   30 tablet   0   . =            Please mix 80mL diphenhydramine, 80mL nystatin, 80 ...   . magnesium oxide (MAG-OX) 400 MG tablet   Oral   Take 400 mg by mouth daily.         .           .           .             Allergies Sulfa antibiotics and Penicillins  History reviewed. No pertinent family history.  Social History Social History  Substance Use Topics  . Smoking status: Never Smoker   . Smokeless tobacco: Never Used  . Alcohol Use: No    Review of Systems Constitutional: No fever/chills Eyes: No visual changes. ENT: No sore throat. Cardiovascular: Denies chest pain. Respiratory: Denies shortness of breath. Gastrointestinal: No abdominal pain.  No nausea, no vomiting.  No diarrhea.  No constipation. Genitourinary: Positive for dysuria. Musculoskeletal: Negative for back pain. Skin: Negative for rash. Neurological: Negative for headaches, focal weakness or numbness.  10-point ROS otherwise negative.  ____________________________________________   PHYSICAL EXAM:  VITAL SIGNS: ED Triage Vitals  Enc Vitals Group     BP 11/18/15 1234 116/72 mmHg     Pulse Rate 11/18/15 1234 81     Resp 11/18/15 1234 18     Temp 11/18/15 1234 98 F (36.7 C)     Temp Source 11/18/15 1234 Oral     SpO2 11/18/15 1234 100 %     Weight 11/18/15 1234 163 lb (73.936 kg)     Height 11/18/15 1234 5\' 4"  (1.626 m)     Head Cir --      Peak Flow --      Pain Score 11/18/15 1242 7     Pain Loc --      Pain Edu? --      Excl. in GC? --     Constitutional: Alert and oriented. Well appearing and in no acute distress. Eyes: Conjunctivae are normal. PERRL. EOMI. Head: Atraumatic.  Ears: normal external appearance bilaterally.   Nose: No congestion/rhinnorhea.  Mouth/Throat: Mucous membranes are moist.  Oropharynx  non-erythematous. Neck: No stridor.  No cervical spine tenderness to palpation. Cardiovascular: Normal rate, regular rhythm. Grossly normal heart sounds.  Good peripheral circulation. Respiratory: Normal respiratory effort.  No retractions. Lungs CTAB. No wheezes, rales or rhonchi.  Gastrointestinal: Soft and nontender. No distention. Normal Bowel sounds. No CVA tenderness. Musculoskeletal: No lower or upper extremity tenderness nor edema. No midline cervical, thoracic or lumbar tenderness to palpation.   Neurologic:  Normal speech and language. No gross focal neurologic deficits are appreciated. No gait instability. Skin:  Skin is warm, dry and intact. No rash noted. Psychiatric: Mood and affect are normal. Speech and behavior are normal.  ____________________________________________   LABS (all labs ordered are listed, but only abnormal results are displayed)  Labs Reviewed               All other components within normal limits  URINALYSIS COMPLETEWITH MICROSCOPIC ([facility] ONLY) - Abnormal; Notable for the following:    Hgb urine dipstick 2+ (*)    Leukocytes, UA 1+ (*)    Bacteria, UA FEW (*)    Squamous Epithelial / LPF 0-5 (*)    All other components within normal limits  PREGNANCY, URINE    RADIOLOGY  No results found.  INITIAL IMPRESSION / ASSESSMENT AND PLAN / ED COURSE  Pertinent labs & imaging results that were available during my care of the patient were reviewed by me and considered in my medical decision making (see chart for details).  Very well appearing patient.  No acute distress. Presents with dysuria x few days and states similar to past when she had a urinary tract infection. Denies vaginal complaints. Denies pregnancy. Urinalysis positive for few bacteria, 1+ leukocyte, and 2+ hemoglobin, and 6-10 wbcs. Urine pregnancy negative. Suspect uti. Will treat with oral macrobid and prn pyridium. Encourage rest, fluids, and PCP follow up in one week. Discussed  indication, risks and benefits of medications with patient.  Discussed follow up with Primary care physician this week. Discussed follow up and return parameters including no resolution or any worsening concerns. Patient verbalized understanding and agreed to plan.   ____________________________________________   FINAL CLINICAL IMPRESSION(S) / ED DIAGNOSES  Final diagnoses:  UTI (lower urinary tract infection)     Discharge Medication List as  of 11/18/2015  1:37 PM    START taking these medications   Details  nitrofurantoin, macrocrystal-monohydrate, (MACROBID) 100 MG capsule Take 1 capsule (100 mg total) by mouth 2 (two) times daily., Starting 11/18/2015, Until Discontinued, Normal    phenazopyridine (PYRIDIUM) 100 MG tablet Take 2 tablets (200 mg total) by mouth 3 (three) times daily as needed for pain., Starting 11/18/2015, Until Discontinued, Normal        Note: This dictation was prepared with Dragon dictation along with smaller phrase technology. Any transcriptional errors that result from this process are unintentional.       Renford DillsLindsey Garland Smouse, NP 12/09/15 [phone]

## 2015-11-18 NOTE — Discharge Instructions (Signed)
Take medication as prescribed. Rest. Drink plenty of fluids.   Follow up with your primary care physician this week. Return to Urgent care or ER for new or worsening concerns.    Urinary Tract Infection Urinary tract infections (UTIs) can develop anywhere along your urinary tract. Your urinary tract is your body's drainage system for removing wastes and extra water. Your urinary tract includes two kidneys, two ureters, a bladder, and a urethra. Your kidneys are a pair of bean-shaped organs. Each kidney is about the size of your fist. They are located below your ribs, one on each side of your spine. CAUSES Infections are caused by microbes, which are microscopic organisms, including fungi, viruses, and bacteria. These organisms are so small that they can only be seen through a microscope. Bacteria are the microbes that most commonly cause UTIs. SYMPTOMS  Symptoms of UTIs may vary by age and gender of the patient and by the location of the infection. Symptoms in young women typically include a frequent and intense urge to urinate and a painful, burning feeling in the bladder or urethra during urination. Older women and men are more likely to be tired, shaky, and weak and have muscle aches and abdominal pain. A fever may mean the infection is in your kidneys. Other symptoms of a kidney infection include pain in your back or sides below the ribs, nausea, and vomiting. DIAGNOSIS To diagnose a UTI, your caregiver will ask you about your symptoms. Your caregiver will also ask you to provide a urine sample. The urine sample will be tested for bacteria and white blood cells. White blood cells are made by your body to help fight infection. TREATMENT  Typically, UTIs can be treated with medication. Because most UTIs are caused by a bacterial infection, they usually can be treated with the use of antibiotics. The choice of antibiotic and length of treatment depend on your symptoms and the type of bacteria causing  your infection. HOME CARE INSTRUCTIONS  If you were prescribed antibiotics, take them exactly as your caregiver instructs you. Finish the medication even if you feel better after you have only taken some of the medication.  Drink enough water and fluids to keep your urine clear or pale yellow.  Avoid caffeine, tea, and carbonated beverages. They tend to irritate your bladder.  Empty your bladder often. Avoid holding urine for long periods of time.  Empty your bladder before and after sexual intercourse.  After a bowel movement, women should cleanse from front to back. Use each tissue only once. SEEK MEDICAL CARE IF:   You have back pain.  You develop a fever.  Your symptoms do not begin to resolve within 3 days. SEEK IMMEDIATE MEDICAL CARE IF:   You have severe back pain or lower abdominal pain.  You develop chills.  You have nausea or vomiting.  You have continued burning or discomfort with urination. MAKE SURE YOU:   Understand these instructions.  Will watch your condition.  Will get help right away if you are not doing well or get worse.   This information is not intended to replace advice given to you by your health care provider. Make sure you discuss any questions you have with your health care provider.   Document Released: 03/18/2005 Document Revised: 02/27/2015 Document Reviewed: 07/17/2011 Elsevier Interactive Patient Education [employer]2016 [employer]vier Inc.

## 2015-11-21 LAB — URINE CULTURE: Culture: 50000 — AB

## 2015-11-22 ENCOUNTER — Telehealth: Payer: Self-pay

## 2015-11-22 MED ORDER — CIPROFLOXACIN HCL 500 MG PO TABS: 500.0000 mg | ORAL_TABLET | Freq: Two times a day (BID) | ORAL | Status: DC

## 2015-11-22 NOTE — Telephone Encounter (Signed)
-----   Message from Hassan RowanEugene Wade, MD sent at 11/21/2015  7:43 PM EDT ----- Please notify patient that she did go out a bacteria that may be resistant to the antibiotic that she was placed on Macrobid. Because of that I recommend Cipro 500 mg #14 one tablet twice a day for week. Please call this into the pharmacy of her choice as well. And have her stop the Macrobid

## 2015-11-22 NOTE — ED Notes (Signed)
Spoke with pt. Pt. Advised that she is on the incorrect antibiotic. Patient advised [facility] [place]

## 2015-12-16 DIAGNOSIS — R87612 Low grade squamous intraepithelial lesion on cytologic smear of cervix (LGSIL): Secondary | ICD-10-CM

## 2015-12-16 HISTORY — DX: Low grade squamous intraepithelial lesion on cytologic smear of cervix (LGSIL): R87.612

## 2015-12-16 LAB — HM PAP SMEAR

## 2015-12-18 ENCOUNTER — Encounter (HOSPITAL_COMMUNITY): Payer: Self-pay

## 2015-12-18 ENCOUNTER — Emergency Department (HOSPITAL_COMMUNITY)
Admission: EM | Admit: 2015-12-18 | Discharge: 2015-12-19 | Disposition: A | Discharge status: Home / Self Care | Payer: Medicaid Other | Attending: Emergency Medicine | Admitting: Emergency Medicine

## 2015-12-18 ENCOUNTER — Emergency Department (HOSPITAL_COMMUNITY): Payer: Medicaid Other

## 2015-12-18 DIAGNOSIS — S060X1A Concussion with loss of consciousness of 30 minutes or less, initial encounter: Secondary | ICD-10-CM | POA: Diagnosis not present

## 2015-12-18 DIAGNOSIS — Y939 Activity, unspecified: Secondary | ICD-10-CM | POA: Diagnosis not present

## 2015-12-18 DIAGNOSIS — Y92411 Interstate highway as the place of occurrence of the external cause: Secondary | ICD-10-CM | POA: Insufficient documentation

## 2015-12-18 DIAGNOSIS — Y999 Unspecified external cause status: Secondary | ICD-10-CM | POA: Diagnosis not present

## 2015-12-18 DIAGNOSIS — S0990XA Unspecified injury of head, initial encounter: Secondary | ICD-10-CM | POA: Diagnosis present

## 2015-12-18 DIAGNOSIS — S161XXA Strain of muscle, fascia and tendon at neck level, initial encounter: Secondary | ICD-10-CM | POA: Diagnosis not present

## 2015-12-18 DIAGNOSIS — S0191XA Laceration without foreign body of unspecified part of head, initial encounter: Secondary | ICD-10-CM | POA: Insufficient documentation

## 2015-12-18 DIAGNOSIS — S0003XA Contusion of scalp, initial encounter: Secondary | ICD-10-CM

## 2015-12-18 MED ORDER — LIDOCAINE-EPINEPHRINE (PF) 2 %-1:200000 IJ SOLN
20.0000 mL | Freq: Once | INTRAMUSCULAR | Status: AC
  Administered 2015-12-18: 20 mL
  Filled 2015-12-18: qty 20

## 2015-12-18 MED ORDER — SODIUM CHLORIDE 0.9 % IV BOLUS (SEPSIS)
1000.0000 mL | Freq: Once | INTRAVENOUS | Status: AC
  Administered 2015-12-18: 1000 mL via INTRAVENOUS

## 2015-12-18 MED ORDER — CYCLOBENZAPRINE HCL 10 MG PO TABS: 10.0000 mg | ORAL_TABLET | Freq: Two times a day (BID) | ORAL | Status: DC | PRN

## 2015-12-18 MED ORDER — HYDROMORPHONE HCL 1 MG/ML IJ SOLN
1.0000 mg | Freq: Once | INTRAMUSCULAR | Status: AC
  Administered 2015-12-18: 1 mg via INTRAVENOUS
  Filled 2015-12-18: qty 1

## 2015-12-18 MED ORDER — OXYCODONE-ACETAMINOPHEN 5-325 MG PO TABS: 1.0000 | ORAL_TABLET | Freq: Four times a day (QID) | ORAL | Status: DC | PRN

## 2015-12-18 MED ORDER — HYDROMORPHONE HCL 1 MG/ML IJ SOLN
1.0000 mg | Freq: Once | INTRAMUSCULAR | Status: AC
  Administered 2015-12-18: 1 mg via INTRAVENOUS

## 2015-12-18 MED ORDER — HYDROMORPHONE HCL 1 MG/ML IJ SOLN
2.0000 mg | Freq: Once | INTRAMUSCULAR | Status: DC
  Filled 2015-12-18: qty 2

## 2015-12-18 MED ORDER — TETANUS-DIPHTH-ACELL PERTUSSIS 5-2.5-18.5 LF-MCG/0.5 IM SUSP
0.5000 mL | Freq: Once | INTRAMUSCULAR | Status: AC
  Administered 2015-12-18: 0.5 mL via INTRAMUSCULAR
  Filled 2015-12-18: qty 0.5
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
## 2015-12-18 NOTE — Discharge Instructions (Signed)
Cervical Sprain A cervical sprain is when the tissues (ligaments) that hold the neck bones in place stretch or tear. HOME CARE   Put ice on the injured area.  Put ice in a plastic bag.  Place a towel between your skin and the bag.  Leave the ice on for 15-20 minutes, 3-4 times a day.  You may have been given a collar to wear. This collar keeps your neck from moving while you heal.  Do not take the collar off unless told by your doctor.  If you have long hair, keep it outside of the collar.  Ask your doctor before changing the position of your collar. You may need to change its position over time to make it more comfortable.  If you are allowed to take off the collar for cleaning or bathing, follow your doctor's instructions on how to do it safely.  Keep your collar clean by wiping it with mild soap and water. Dry it completely. If the collar has removable pads, remove them every 1-2 days to hand wash them with soap and water. Allow them to air dry. They should be dry before you wear them in the collar.  Do not drive while wearing the collar.  Only take medicine as told by your doctor.  Keep all doctor visits as told.  Keep all physical therapy visits as told.  Adjust your work station so that you have good posture while you work.  Avoid positions and activities that make your problems worse.  Warm up and stretch before being active. GET HELP IF:  Your pain is not controlled with medicine.  You cannot take less pain medicine over time as planned.  Your activity level does not improve as expected. GET HELP RIGHT AWAY IF:   You are bleeding.  Your stomach is upset.  You have an allergic reaction to your medicine.  You develop new problems that you cannot explain.  You lose feeling (become numb) or you cannot move any part of your body (paralysis).  You have tingling or weakness in any part of your body.  Your symptoms get worse. Symptoms include:  Pain,  soreness, stiffness, puffiness (swelling), or a burning feeling in your neck.  Pain when your neck is touched.  Shoulder or upper back pain.  Limited ability to move your neck.  Headache.  Dizziness.  Your hands or arms feel week, lose feeling, or tingle.  Muscle spasms.  Difficulty swallowing or chewing. MAKE SURE YOU:   Understand these instructions.  Will watch your condition.  Will get help right away if you are not doing well or get worse.   This information is not intended to replace advice given to you by your health care provider. Make sure you discuss any questions you have with your health care provider.   Document Released: 11/25/2007 Document Revised: 02/08/2013 Document Reviewed: 12/14/2012 Elsevier Interactive Patient Education 2016 [employer].  Concussion, Adult A concussion is a brain injury. It is caused by:  A hit to the head.  A quick and sudden movement (jolt) of the head or neck. A concussion is usually not life threatening. Even so, it can cause serious problems. If you had a concussion before, you may have concussion-like problems after a hit to your head. HOME CARE General Instructions  Follow your doctor's directions carefully.  Take medicines only as told by your doctor.  Only take medicines your doctor says are safe.  Do not drink alcohol until your doctor says it  is okay. Alcohol and some drugs can slow down healing. They can also put you at risk for further injury.  If you are having trouble remembering things, write them down.  Try to do one thing at a time if you get distracted easily. For example, do not watch TV while making dinner.  Talk to your family members or close friends when making important decisions.  Follow up with your doctor as told.  Watch your symptoms. Tell others to do the same. Serious problems can sometimes happen after a concussion. Older adults are more likely to have these problems.  Tell your  teachers, school nurse, school counselor, coach, Event organiserathletic trainer, or work Production designer, theatre/television/filmmanager about your concussion. Tell them about what you can or cannot do. They should watch to see if:  It gets even harder for you to pay attention or concentrate.  It gets even harder for you to remember things or learn new things.  You need more time than normal to finish things.  You become annoyed (irritable) more than before.  You are not able to deal with stress as well.  You have more problems than before.  Rest. Make sure you:  Get plenty of sleep at night.  Go to sleep early.  Go to bed at the same time every day. Try to wake up at the same time.  Rest during the day.  Take naps when you feel tired.  Limit activities where you have to think a lot or concentrate. These include:  Doing homework.  Doing work related to a job.  Watching TV.  Using the computer. Returning To Your Regular Activities Return to your normal activities slowly, not all at once. You must give your body and brain enough time to heal.   Do not play sports or do other athletic activities until your doctor says it is okay.  Ask your doctor when you can drive, ride a bicycle, or work other vehicles or machines. Never do these things if you feel dizzy.  Ask your doctor about when you can return to work or school. Preventing Another Concussion It is very important to avoid another brain injury, especially before you have healed. In rare cases, another injury can lead to permanent brain damage, brain swelling, or death. The risk of this is greatest during the first 7-10 days after your injury. Avoid injuries by:   Wearing a seat belt when riding in a car.  Not drinking too much alcohol.  Avoiding activities that could lead to a second concussion (such as contact sports).  Wearing a helmet when doing activities like:  Biking.  Skiing.  Skateboarding.  Skating.  Making your home safer by:  Removing things  from the floor or stairways that could make you trip.  Using grab bars in bathrooms and handrails by stairs.  Placing non-slip mats on floors and in bathtubs.  Improve lighting in dark areas. GET HELP IF:  It gets even harder for you to pay attention or concentrate.  It gets even harder for you to remember things or learn new things.  You need more time than normal to finish things.  You become annoyed (irritable) more than before.  You are not able to deal with stress as well.  You have more problems than before.  You have problems keeping your balance.  You are not able to react quickly when you should. Get help if you have any of these problems for more than 2 weeks:   Lasting (chronic) headaches.  Dizziness or trouble balancing.  Feeling sick to your stomach (nausea).  Seeing (vision) problems.  Being affected by noises or light more than normal.  Feeling sad, low, down in the dumps, blue, gloomy, or empty (depressed).  Mood changes (mood swings).  Feeling of fear or nervousness about what may happen (anxiety).  Feeling annoyed.  Memory problems.  Problems concentrating or paying attention.  Sleep problems.  Feeling tired all the time. GET HELP RIGHT AWAY IF:   You have bad headaches or your headaches get worse.  You have weakness (even if it is in one hand, leg, or part of the face).  You have loss of feeling (numbness).  You feel off balance.  You keep throwing up (vomiting).  You feel tired.  One black center of your eye (pupil) is larger than the other.  You twitch or shake violently (convulse).  Your speech is not clear (slurred).  You are more confused, easily angered (agitated), or annoyed than before.  You have more trouble resting than before.  You are unable to recognize people or places.  You have neck pain.  It is difficult to wake you up.  You have unusual behavior changes.  You pass out (lose consciousness). MAKE  SURE YOU:   Understand these instructions.  Will watch your condition.  Will get help right away if you are not doing well or get worse.   This information is not intended to replace advice given to you by your health care provider. Make sure you discuss any questions you have with your health care provider.   Document Released: 05/27/2009 Document Revised: 06/29/2014 Document Reviewed: 12/29/2012 Elsevier Interactive Patient Education [employer]2016 Elsevier Inc.

## 2016-01-15 HISTORY — PX: COLPOSCOPY: SHX161

## 2016-03-15 ENCOUNTER — Emergency Department
Admission: EM | Admit: 2016-03-15 | Discharge: 2016-03-15 | Disposition: A | Discharge status: Home / Self Care | Payer: Medicaid Other | Attending: Emergency Medicine | Admitting: Emergency Medicine

## 2016-03-15 DIAGNOSIS — R358 Other polyuria: Secondary | ICD-10-CM | POA: Diagnosis not present

## 2016-03-15 DIAGNOSIS — Z792 Long term (current) use of antibiotics: Secondary | ICD-10-CM | POA: Diagnosis not present

## 2016-03-15 DIAGNOSIS — R300 Dysuria: Secondary | ICD-10-CM

## 2016-03-15 DIAGNOSIS — Z791 Long term (current) use of non-steroidal anti-inflammatories (NSAID): Secondary | ICD-10-CM | POA: Insufficient documentation

## 2016-03-15 DIAGNOSIS — Z79899 Other long term (current) drug therapy: Secondary | ICD-10-CM | POA: Diagnosis not present

## 2016-03-15 DIAGNOSIS — Z3A12 12 weeks gestation of pregnancy: Secondary | ICD-10-CM | POA: Insufficient documentation

## 2016-03-15 DIAGNOSIS — O26891 Other specified pregnancy related conditions, first trimester: Secondary | ICD-10-CM | POA: Diagnosis not present

## 2016-03-15 LAB — URINALYSIS COMPLETE WITH MICROSCOPIC (ARMC ONLY)
Bacteria, UA: NONE SEEN
Bilirubin Urine: NEGATIVE
Glucose, UA: NEGATIVE mg/dL
KETONES UR: NEGATIVE mg/dL
NITRITE: NEGATIVE
PROTEIN: 30 mg/dL — AB
Specific Gravity, Urine: 1.027 (ref 1.005–1.030)
pH: 6 (ref 5.0–8.0)

## 2016-03-15 LAB — POCT PREGNANCY, URINE: PREG TEST UR: POSITIVE — AB

## 2016-03-15 MED ORDER — NITROFURANTOIN MACROCRYSTAL 100 MG PO CAPS: 100.0000 mg | ORAL_CAPSULE | Freq: Four times a day (QID) | ORAL | 0 refills | Status: DC

## 2016-03-15 NOTE — ED Notes (Signed)
Pt reports she has had a cold for the last week and continues to have headache/body aches/nasal congestion/sore throat and then last night she started with urinary frequency but only able to void a small amount at a time - reports pain and burning with urination - pt is [redacted] weeks pregnant

## 2016-03-15 NOTE — ED Provider Notes (Signed)
[facility] Emergency Department Provider Note  ____________________________________________  Time seen: Approximately 3:05 PM  I have reviewed the triage vital signs and the nursing notes.   HISTORY  Chief Complaint Urinary Frequency    HPI Alyssa Kemp is a 25 y.o. female who presents emergency department complaining of dysuria and polyuria 1 day. Patient states that she has had a history of UTIs in the past and states that symptoms feel the same as previous UTIs. Patient is [redacted] weeks pregnant but denies any abdominal pain, vaginal discharge or bleeding. Patient denies any flank pain. She denies any hematuria. Patient does report having "a cold" earlier on in the week that has resolved. Patient states that just overall "she feels bad." Patient is able to maintain good oral intake of solids and liquids. Patient reports decreased urinary output per time. Patient states that she has urinated 10-15 times today.   Past Medical History:  Diagnosis Date  . Scoliosis     There are no active problems to display for this patient.   Past Surgical History:  Procedure Laterality Date  . ADENOIDECTOMY    . TONSILLECTOMY    . WISDOM TOOTH EXTRACTION      Prior to Admission medications   Medication Sig Start Date End Date Taking? Authorizing Provider  azithromycin (ZITHROMAX Z-PAK) 250 MG tablet Take 2 tablets (500 mg) on  Day 1,  followed by 1 tablet (250 mg) once daily on Days 2 through 5. 10/17/15   Jami L Hagler, PA-C  Biotin (PA BIOTIN) 1000 MCG tablet Take 1,000 mcg by mouth daily.    Historical Provider, MD  ciprofloxacin (CIPRO) 500 MG tablet Take 1 tablet (500 mg total) by mouth 2 (two) times daily. 11/22/15   Hassan Rowan, MD  cyclobenzaprine (FLEXERIL) 10 MG tablet Take 1 tablet (10 mg total) by mouth 2 (two) times daily as needed for muscle spasms. 12/18/15   Gwyneth Sprout, MD  loratadine (CLARITIN) 10 MG tablet Take 1 tablet (10 mg total) by mouth  daily. 10/17/15   Jami L Hagler, PA-C  magic mouthwash w/lidocaine SOLN Take 5 mLs by mouth 4 (four) times daily as needed for mouth pain. 10/17/15   Jami L Hagler, PA-C  meloxicam (MOBIC) 15 MG tablet Take 15 mg by mouth daily.    Historical Provider, MD  nitrofurantoin, macrocrystal-monohydrate, (MACROBID) 100 MG capsule Take 1 capsule (100 mg total) by mouth 2 (two) times daily. 11/18/15   Renford Dills, NP  oxyCODONE-acetaminophen (PERCOCET/ROXICET) 5-325 MG tablet Take 1-2 tablets by mouth every 6 (six) hours as needed for severe pain. 12/18/15   Gwyneth Sprout, MD  phenazopyridine (PYRIDIUM) 100 MG tablet Take 2 tablets (200 mg total) by mouth 3 (three) times daily as needed for pain. 11/18/15   Renford Dills, NP  Prenatal Vit-Fe Fumarate-FA (PREPLUS) 27-1 MG TABS Take 1 tablet by mouth daily. 11/27/15   Historical Provider, MD  promethazine-dextromethorphan (PROMETHAZINE-DM) 6.25-15 MG/5ML syrup Take 5 mLs by mouth 4 (four) times daily as needed for cough. 10/17/15   Jami L Hagler, PA-C  ranitidine (ZANTAC) 150 MG tablet Take 150 mg by mouth daily. 10/17/15   Historical Provider, MD  sertraline (ZOLOFT) 50 MG tablet Take 50 mg by mouth daily.    Historical Provider, MD    Allergies Sulfa antibiotics; Sulfacetamide sodium; Doxycycline; Amoxicillin-pot clavulanate; and Penicillins  No family history on file.  Social History Social History  Substance Use Topics  . Smoking status: Never Smoker  . Smokeless tobacco: Never  Used  . Alcohol use No     Review of Systems  Constitutional: No fever/chills Cardiovascular: no chest pain. Respiratory: no cough. No SOB. Gastrointestinal: No abdominal pain.  No nausea, no vomiting.  No diarrhea.  No constipation. Genitourinary: Positive for dysuria. Positive for polyuria. No hematuria. No vaginal bleeding. No vaginal discharge. Musculoskeletal: Negative for musculoskeletal pain. Skin: Negative for rash, abrasions, lacerations,  ecchymosis. Neurological: Negative for headaches, focal weakness or numbness. 10-point ROS otherwise negative.  ____________________________________________   PHYSICAL EXAM:  VITAL SIGNS: ED Triage Vitals  Enc Vitals Group     BP 03/15/16 1434 118/60     Pulse Rate 03/15/16 1434 72     Resp 03/15/16 1434 20     Temp 03/15/16 1434 98 F (36.7 C)     Temp Source 03/15/16 1434 Oral     SpO2 03/15/16 1434 99 %     Weight 03/15/16 1434 171 lb (77.6 kg)     Height 03/15/16 1434 5\' 4"  (1.626 m)     Head Circumference --      Peak Flow --      Pain Score 03/15/16 1436 8     Pain Loc --      Pain Edu? --      Excl. in GC? --      Constitutional: Alert and oriented. Well appearing and in no acute distress. Eyes: Conjunctivae are normal. PERRL. EOMI. Head: Atraumatic. Cardiovascular: Normal rate, regular rhythm. Normal S1 and S2.  Good peripheral circulation. Respiratory: Normal respiratory effort without tachypnea or retractions. Lungs CTAB. Good air entry to the bases with no decreased or absent breath sounds. Gastrointestinal: Bowel sounds 4 quadrants. Soft and nontender to palpation. No guarding or rigidity. No palpable masses. Fundus is not palpable. No distention. No CVA tenderness. Musculoskeletal: Full range of motion to all extremities. No gross deformities appreciated. Neurologic:  Normal speech and language. No gross focal neurologic deficits are appreciated.  Skin:  Skin is warm, dry and intact. No rash noted. Psychiatric: Mood and affect are normal. Speech and behavior are normal. Patient exhibits appropriate insight and judgement.   ____________________________________________   LABS (all labs ordered are listed, but only abnormal results are displayed)  Labs Reviewed  URINALYSIS COMPLETEWITH MICROSCOPIC ([facility] ONLY) - Abnormal; Notable for the following:       Result Value   Color, Urine YELLOW (*)    APPearance HAZY (*)    Hgb urine dipstick 2+ (*)     Protein, ur 30 (*)    Leukocytes, UA 1+ (*)    Squamous Epithelial / LPF 0-5 (*)    All other components within normal limits  POCT PREGNANCY, URINE - Abnormal; Notable for the following:    Preg Test, Ur POSITIVE (*)    All other components within normal limits  POC URINE PREG, ED   ____________________________________________  EKG   ____________________________________________  RADIOLOGY   No results found.  ____________________________________________    PROCEDURES  Procedure(s) performed:    Procedures    Medications - No data to display   ____________________________________________   INITIAL IMPRESSION / ASSESSMENT AND PLAN / ED COURSE  Pertinent labs & imaging results that were available during my care of the patient were reviewed by me and considered in my medical decision making (see chart for details).  Review of the [place] CSRS was performed in accordance of the [facility] prior to dispensing any controlled drugs.  Clinical Course    Patient's diagnosis is consistent with Dysuria. Patient's  urinalysis does show mild leukocytosis but no nitrates or bacteria is present. Patient is [redacted] weeks pregnant but denies any abdominal pain, vaginal bleeding or discharge. At this time, no further workup as deemed necessary. Due to patient's complaint, similarity with previous UTIs, mild leukocytosis, patient will be placed on antibiotics due to pregnancy status. Patient is advised to follow-up with primary care or OB/GYN.. Patient is given ED precautions to return to the ED for any worsening or new symptoms.     ____________________________________________  FINAL CLINICAL IMPRESSION(S) / ED DIAGNOSES  Final diagnoses:  None      NEW MEDICATIONS STARTED DURING THIS VISIT:  New Prescriptions   No medications on file        This chart was dictated using voice recognition software/Dragon. Despite best efforts to proofread, errors can occur which can change the  meaning. Any change was purely unintentional.    Racheal Patches, PA-C 03/15/16 1525    Phineas Semen, MD 03/15/16 2020

## 2016-03-15 NOTE — ED Triage Notes (Signed)
Pt reports she has had a cold for the last week and then last night she started with urinary frequency but only able to void a small amount at a time - reports pain and burning with urination - pt is [redacted] weeks pregnant

## 2016-04-23 ENCOUNTER — Emergency Department: Payer: Medicaid Other

## 2016-04-23 ENCOUNTER — Emergency Department
Admission: EM | Admit: 2016-04-23 | Discharge: 2016-04-23 | Disposition: A | Discharge status: Home / Self Care | Payer: Medicaid Other | Attending: Emergency Medicine | Admitting: Emergency Medicine

## 2016-04-23 ENCOUNTER — Encounter: Payer: Self-pay | Admitting: Emergency Medicine

## 2016-04-23 DIAGNOSIS — K5900 Constipation, unspecified: Secondary | ICD-10-CM | POA: Diagnosis not present

## 2016-04-23 DIAGNOSIS — R079 Chest pain, unspecified: Secondary | ICD-10-CM | POA: Insufficient documentation

## 2016-04-23 DIAGNOSIS — Z79899 Other long term (current) drug therapy: Secondary | ICD-10-CM | POA: Insufficient documentation

## 2016-04-23 DIAGNOSIS — R1013 Epigastric pain: Secondary | ICD-10-CM | POA: Insufficient documentation

## 2016-04-23 DIAGNOSIS — R109 Unspecified abdominal pain: Secondary | ICD-10-CM

## 2016-04-23 DIAGNOSIS — O26892 Other specified pregnancy related conditions, second trimester: Secondary | ICD-10-CM | POA: Insufficient documentation

## 2016-04-23 DIAGNOSIS — Z3A18 18 weeks gestation of pregnancy: Secondary | ICD-10-CM | POA: Insufficient documentation

## 2016-04-23 LAB — CBC
HEMATOCRIT: 34.6 % — AB (ref 35.0–47.0)
Hemoglobin: 12.7 g/dL (ref 12.0–16.0)
MCH: 30.8 pg (ref 26.0–34.0)
MCHC: 36.8 g/dL — ABNORMAL HIGH (ref 32.0–36.0)
MCV: 83.7 fL (ref 80.0–100.0)
Platelets: 159 10*3/uL (ref 150–440)
RBC: 4.13 MIL/uL (ref 3.80–5.20)
RDW: 13.4 % (ref 11.5–14.5)
WBC: 9.1 10*3/uL (ref 3.6–11.0)

## 2016-04-23 LAB — URINALYSIS COMPLETE WITH MICROSCOPIC (ARMC ONLY)
Bilirubin Urine: NEGATIVE
Glucose, UA: NEGATIVE mg/dL
Ketones, ur: NEGATIVE mg/dL
Nitrite: NEGATIVE
PROTEIN: NEGATIVE mg/dL
Specific Gravity, Urine: 1.006 (ref 1.005–1.030)
pH: 6 (ref 5.0–8.0)

## 2016-04-23 LAB — PROTEIN / CREATININE RATIO, URINE
CREATININE, URINE: 69 mg/dL
PROTEIN CREATININE RATIO: 0.09 mg/mg{creat} (ref 0.00–0.15)
Total Protein, Urine: 6 mg/dL

## 2016-04-23 LAB — OB RESULTS CONSOLE RUBELLA ANTIBODY, IGM: Rubella: IMMUNE

## 2016-04-23 LAB — COMPREHENSIVE METABOLIC PANEL
ALBUMIN: 3.2 g/dL — AB (ref 3.5–5.0)
ALT: 92 U/L — ABNORMAL HIGH (ref 14–54)
AST: 78 U/L — AB (ref 15–41)
Alkaline Phosphatase: 62 U/L (ref 38–126)
Anion gap: 6 (ref 5–15)
BILIRUBIN TOTAL: 0.6 mg/dL (ref 0.3–1.2)
BUN: 10 mg/dL (ref 6–20)
CO2: 25 mmol/L (ref 22–32)
Calcium: 8.7 mg/dL — ABNORMAL LOW (ref 8.9–10.3)
Chloride: 102 mmol/L (ref 101–111)
Creatinine, Ser: 0.63 mg/dL (ref 0.44–1.00)
GFR calc Af Amer: >60 mL/min (ref >60)
GFR calc non Af Amer: >60 mL/min (ref >60)
Glucose, Bld: 110 mg/dL — ABNORMAL HIGH (ref 65–99)
POTASSIUM: 3.7 mmol/L (ref 3.5–5.1)
Sodium: 133 mmol/L — ABNORMAL LOW (ref 135–145)
TOTAL PROTEIN: 6.7 g/dL (ref 6.5–8.1)

## 2016-04-23 LAB — LIPASE, BLOOD: Lipase: 20 U/L (ref 11–51)

## 2016-04-23 LAB — OB RESULTS CONSOLE VARICELLA ZOSTER ANTIBODY, IGG: VARICELLA IGG: NON-IMMUNE/NOT IMMUNE

## 2016-04-23 LAB — TROPONIN I: Troponin I: <0.03 ng/mL (ref <0.03)

## 2016-04-23 LAB — ACETAMINOPHEN LEVEL

## 2016-04-23 MED ORDER — CEPHALEXIN 500 MG PO CAPS
500.0000 mg | ORAL_CAPSULE | Freq: Once | ORAL | Status: DC
  Filled 2016-04-23: qty 1

## 2016-04-23 MED ORDER — ACETAMINOPHEN 500 MG PO TABS: 1000.0000 mg | ORAL_TABLET | Freq: Once | ORAL | Status: DC

## 2016-04-23 MED ORDER — CEPHALEXIN 500 MG PO CAPS: 500.0000 mg | ORAL_CAPSULE | Freq: Four times a day (QID) | ORAL | 0 refills | Status: AC

## 2016-04-23 MED ORDER — ACETAMINOPHEN 500 MG PO TABS
1000.0000 mg | ORAL_TABLET | Freq: Once | ORAL | Status: AC
  Administered 2016-04-23: 1000 mg via ORAL
  Filled 2016-04-23: qty 2

## 2016-04-23 MED ORDER — ACETAMINOPHEN 500 MG PO TABS
ORAL_TABLET | ORAL | Status: AC
  Filled 2016-04-23: qty 2

## 2016-04-23 NOTE — ED Provider Notes (Signed)
[facility] Emergency Department Provider Note  ____________________________________________  Time seen: Approximately 3:19 PM  I have reviewed the triage vital signs and the nursing notes.   HISTORY  Chief Complaint Abdominal Pain and Chest Pain   HPI Alyssa Kemp is a 25 y.o. female G3P2 at 6518 GA who presents for evaluation of abdominal pain. Patient reports that she's been having intermittent episodes of lower abdominal pain over the course of the last 3 days. She reports that these episodes can happen at any time and she describes them as a sharp cramping, like a contraction pain that starts in bilateral lower abdomen and radiates up to her chest and is associated with shortness of breath. These episodes last a few minutes and resolved without intervention. She reports close to 20 episodes in the last 3 days. She denies any abdominal pain at this time. Her next appointment with her OB/GYN isn't for another 19 days. Patient denies shortness of breath, nausea, vomiting, diarrhea, fever, cough, dysuria, hematuria, vaginal bleeding or discharge. She does endorse constipation and says she is only gone to the bathroom twice in the last 10 days. She denies having any complications with prior pregnancies. She also reports that she sustained a concussion in January 2017 and since becoming pregnant she has been having more frequent HAs that she describes as sharp pain, frontal, non radiating and not associated with floaters, photophobia or phonophobia. She reports that she's been taking 4-6 extra strength Tylenol a day for these headaches. She currently endorses a mild headache at this time.  Past Medical History:  Diagnosis Date  . Scoliosis     There are no active problems to display for this patient.   Past Surgical History:  Procedure Laterality Date  . ADENOIDECTOMY    . TONSILLECTOMY    . WISDOM TOOTH EXTRACTION      Prior to Admission medications     Medication Sig Start Date End Date Taking? Authorizing Provider  Prenatal Vit-Fe Fumarate-FA (PREPLUS) 27-1 MG TABS Take 1 tablet by mouth daily. 11/27/15  Yes Historical Provider, MD  ranitidine (ZANTAC) 150 MG tablet Take 150 mg by mouth daily. 10/17/15  Yes Historical Provider, MD  sertraline (ZOLOFT) 50 MG tablet Take 50 mg by mouth daily.   Yes Historical Provider, MD  cephALEXin (KEFLEX) 500 MG capsule Take 1 capsule (500 mg total) by mouth 4 (four) times daily. 04/23/16 05/03/16  Nita Sicklearolina Angelette Ganus, MD    Allergies Sulfa antibiotics; Sulfacetamide sodium; Doxycycline; Amoxicillin-pot clavulanate; and Penicillins  No family history on file.  Social History Social History  Substance Use Topics  . Smoking status: Never Smoker  . Smokeless tobacco: Never Used  . Alcohol use No    Review of Systems  Constitutional: Negative for fever. Eyes: Negative for visual changes. ENT: Negative for sore throat. Cardiovascular: + chest pain. Respiratory: Negative for shortness of breath. Gastrointestinal: + b/l lower abdominal pain and constipation. No vomiting or diarrhea. Genitourinary: Negative for dysuria. Musculoskeletal: Negative for back pain. Skin: Negative for rash. Neurological: Negative for weakness or numbness. + HA  ____________________________________________   PHYSICAL EXAM:  VITAL SIGNS: ED Triage Vitals  Enc Vitals Group     BP 04/23/16 1408 115/71     Pulse Rate 04/23/16 1408 89     Resp 04/23/16 1408 18     Temp 04/23/16 1408 98.3 F (36.8 C)     Temp Source 04/23/16 1408 Oral     SpO2 04/23/16 1408 100 %  Weight 04/23/16 1405 170 lb (77.1 kg)     Height 04/23/16 1405 5\' 4"  (1.626 m)     Head Circumference --      Peak Flow --      Pain Score 04/23/16 1405 6     Pain Loc --      Pain Edu? --      Excl. in GC? --     Constitutional: Alert and oriented. Well appearing and in no apparent distress. HEENT:      Head: Normocephalic and atraumatic.          Eyes: Conjunctivae are normal. Sclera is non-icteric. EOMI. PERRL      Mouth/Throat: Mucous membranes are moist.       Neck: Supple with no signs of meningismus. Cardiovascular: Regular rate and rhythm. No murmurs, gallops, or rubs. 2+ symmetrical distal pulses are present in all extremities. No JVD. Respiratory: Normal respiratory effort. Lungs are clear to auscultation bilaterally. No wheezes, crackles, or rhonchi.  Gastrointestinal: Gravid with mild tenderness to palpation over the epigastric region positive bowel sounds. No rebound or guarding. Genitourinary: No CVA tenderness. Musculoskeletal: Nontender with normal range of motion in all extremities. No edema, cyanosis, or erythema of extremities. Neurologic: Normal speech and language. Face is symmetric. Moving all extremities. No gross focal neurologic deficits are appreciated. Skin: Skin is warm, dry and intact. No rash noted. Psychiatric: Mood and affect are normal. Speech and behavior are normal.  ____________________________________________   LABS (all labs ordered are listed, but only abnormal results are displayed)  Labs Reviewed  COMPREHENSIVE METABOLIC PANEL - Abnormal; Notable for the following:       Result Value   Sodium 133 (*)    Glucose, Bld 110 (*)    Calcium 8.7 (*)    Albumin 3.2 (*)    AST 78 (*)    ALT 92 (*)    All other components within normal limits  CBC - Abnormal; Notable for the following:    HCT 34.6 (*)    MCHC 36.8 (*)    All other components within normal limits  URINALYSIS COMPLETEWITH MICROSCOPIC ([facility] ONLY) - Abnormal; Notable for the following:    Color, Urine YELLOW (*)    APPearance CLOUDY (*)    Hgb urine dipstick 2+ (*)    Leukocytes, UA TRACE (*)    Bacteria, UA RARE (*)    Squamous Epithelial / LPF TOO NUMEROUS TO COUNT (*)    All other components within normal limits  ACETAMINOPHEN LEVEL - Abnormal; Notable for the following:    Acetaminophen (Tylenol), Serum <10 (*)    All  other components within normal limits  LIPASE, BLOOD  PROTEIN / CREATININE RATIO, URINE  TROPONIN I   ____________________________________________  EKG  ED ECG REPORT I, Nita Sicklearolina Dula Havlik, the attending physician, personally viewed and interpreted this ECG.  Normal sinus rhythm, rate of 85, normal intervals, normal axis, no ST elevations or depressions. ____________________________________________  RADIOLOGY  RUQ US: Negative ____________________________________________   PROCEDURES  Procedure(s) performed: None Procedures Critical Care performed:  None ____________________________________________   INITIAL IMPRESSION / ASSESSMENT AND PLAN / ED COURSE  25 y.o. female G3P2 at 1018 GA who presents for evaluation of intermittent bilateral lower abdominal pain radiating to the chest lasting a few minutes a time for the last 3 days. Patient reports no abdominal pain or chest pain at this time. She also has been having HAs since a concussion in January. Patient is neurologically intact, has normal vital signs and  no evidence of hypertension, she is tender to palpation on the epigastric region with no rebound or guarding and does have mildly elevated LFTs. Patient does endorse she's been taking a lot of Tylenol for the last few months for her headaches. We'll check a Tylenol level and get a right upper quadrant ultrasound to rule out gallbladder etiology of her pain. The pain almost sounds like round ligament versus Deberah Pelton with very short lived episodes of pain. Symptoms not consistent with appendicitis, diverticulitis, PE, ACS. No elevated BP, normal platelets and hemoglobin so therefore low suspicion for pre-eclampsia or HELLP syndrome. Will check UA and UPC ratio.  Clinical Course  Comment By Time  Fetal heart tones 130. RUQ US showing normal liver and gb. Tylenol level negative. UA showing bacteriuria, will treat with keflex. UPC ratio normal, no proteinuria. BP remains normal.    Nita Sickle, MD 11/02 [phone]  Spoke with Dr. Bonney Aid about presentation, lab and imaging findings, plan and abx for bacteriuria and he agrees with the plan. Recommended patient call the clinic in the am for a close follow up tomorrow. Patient will be discharged home at this time.  Nita Sickle, MD 11/02 1657    Pertinent labs & imaging results that were available during my care of the patient were reviewed by me and considered in my medical decision making (see chart for details).    ____________________________________________   FINAL CLINICAL IMPRESSION(S) / ED DIAGNOSES  Final diagnoses:  Epigastric pain  Abdominal pain, unspecified abdominal location      NEW MEDICATIONS STARTED DURING THIS VISIT:  New Prescriptions   CEPHALEXIN (KEFLEX) 500 MG CAPSULE    Take 1 capsule (500 mg total) by mouth 4 (four) times daily.     Note:  This document was prepared using Dragon voice recognition software and may include unintentional dictation errors.    Nita Sickle, MD 04/23/16 1700

## 2016-04-23 NOTE — ED Triage Notes (Signed)
Pt reports bilateral lower abdominal pain that radiates up into chest. Pt reports some shortness of breath, reports she is 18w today.

## 2016-04-23 NOTE — Discharge Instructions (Signed)

## 2016-04-23 NOTE — ED Notes (Signed)
Pt to bathroom for urine sample and then to us. Fetal doppler at bedside

## 2016-05-18 ENCOUNTER — Observation Stay
Admission: EM | Admit: 2016-05-18 | Discharge: 2016-05-18 | Disposition: A | Discharge status: Home / Self Care | Payer: Medicaid Other | Attending: Certified Nurse Midwife | Admitting: Certified Nurse Midwife

## 2016-05-18 DIAGNOSIS — O23592 Infection of other part of genital tract in pregnancy, second trimester: Secondary | ICD-10-CM | POA: Diagnosis present

## 2016-05-18 DIAGNOSIS — R102 Pelvic and perineal pain: Secondary | ICD-10-CM

## 2016-05-18 DIAGNOSIS — O26892 Other specified pregnancy related conditions, second trimester: Secondary | ICD-10-CM | POA: Diagnosis present

## 2016-05-18 DIAGNOSIS — Z3A21 21 weeks gestation of pregnancy: Secondary | ICD-10-CM | POA: Diagnosis not present

## 2016-05-18 DIAGNOSIS — M545 Low back pain: Secondary | ICD-10-CM | POA: Diagnosis not present

## 2016-05-18 DIAGNOSIS — N760 Acute vaginitis: Secondary | ICD-10-CM | POA: Insufficient documentation

## 2016-05-18 HISTORY — DX: Gastro-esophageal reflux disease without esophagitis: K21.9

## 2016-05-18 HISTORY — DX: Low grade squamous intraepithelial lesion on cytologic smear of cervix (LGSIL): R87.612

## 2016-05-18 LAB — URINALYSIS COMPLETE WITH MICROSCOPIC (ARMC ONLY)
Bacteria, UA: NONE SEEN
Bilirubin Urine: NEGATIVE
GLUCOSE, UA: NEGATIVE mg/dL
Hgb urine dipstick: NEGATIVE
KETONES UR: NEGATIVE mg/dL
Nitrite: NEGATIVE
PROTEIN: NEGATIVE mg/dL
Specific Gravity, Urine: 1.024 (ref 1.005–1.030)
pH: 7 (ref 5.0–8.0)

## 2016-05-18 MED ORDER — METRONIDAZOLE 500 MG PO TABS: 500.0000 mg | ORAL_TABLET | Freq: Two times a day (BID) | ORAL | 0 refills | Status: AC

## 2016-05-18 MED ORDER — CYCLOBENZAPRINE HCL 10 MG PO TABS: 5.0000 mg | ORAL_TABLET | Freq: Three times a day (TID) | ORAL | 0 refills | Status: DC | PRN

## 2016-05-18 NOTE — Final Progress Note (Signed)
Physician Final Progress Note  Patient ID: Genice RougeBrittney N Poole MRN: [id number] DOB/AGE: 23/10/1990 25 y.o.  Admit date: 05/18/2016 Admitting provider: Vena AustriaAndreas Staebler, MD/ Gasper Lloydolleen L. Sharen HonesGutierrez, CNM Discharge date: 05/18/2016   Admission Diagnoses: IUP at 21wk4d with vaginal pain and discharge Lower back pain  Discharge Diagnoses:  IUP at 21wk4d with bacterial vaginosis Lumbago Consults: none  Significant Findings/ Diagnostic Studies: 25 year old g3 P2002 with EDC=09/24/2016 by LMP=8 week ultrtasound presented at 21wk4d with complaints of vaginal tenderness and vaginal discharge for 3 days. Vagina feels sore. Discharge ranges from clear to yellow in color. Has also had worse lower back pain x 3 days. Has a history of lumbago and sciatica and was undergoing treatment for this prior to her pregnancy. Was about to have steroid injections when she found out she was pregnant. She states her back locks up and she has difficulty getting OOB in the AM due to the pain in her back and buttock and difficulty moving legs. Baby active. No vaginal bleeding or dysuria. [facility] has been remarkable for LGSIL PAP (had colpo, needs Pap at 28 weeks), being RH negative, and abdominal pain/ nausea and vomiting and elevated LFTs. Most recent LFTs 11/21 were WNL. Has GI appt mid January. Current medications: Diclegis, promethazine, Tylenol and prenatal vitamins Allergies include sulfa, Penicillin, augmentin and doxycycline Social history: nonsmoker Family history reviewed and positive for hypertension, heart disease. Mother has had ovarian and cervical cancer Exam: BP (!) 121/59 (BP Location: Left Arm)   Pulse 75   Temp 97.9 F (36.6 C) (Oral)   Resp 18   Ht 5\' 3"  (1.6 m)   Wt 73.5 kg (162 lb)   LMP 12/19/2015 (LMP Unknown)   BMI 28.70 kg/m   General: WF in NAD FHR: 145 with DT Abdomen: soft, gravid Vulva: non inflamed, mucoid discharge present  Vagina: small amt white  discharge Cervix: closed/ thick/ long Wet prep: positive clue cells, neg hyphae and Trich Back: tenderness in SI joints bilaterally Results for orders placed or performed during the hospital encounter of 05/18/16 (from the past 24 hour(s))  Urinalysis complete, with microscopic ([facility] only)     Status: Abnormal   Collection Time: 05/18/16  9:37 PM  Result Value Ref Range   Color, Urine YELLOW (A) YELLOW   APPearance HAZY (A) CLEAR   Glucose, UA NEGATIVE NEGATIVE mg/dL   Bilirubin Urine NEGATIVE NEGATIVE   Ketones, ur NEGATIVE NEGATIVE mg/dL   Specific Gravity, Urine 1.024 1.005 - 1.030   Hgb urine dipstick NEGATIVE NEGATIVE   pH 7.0 5.0 - 8.0   Protein, ur NEGATIVE NEGATIVE mg/dL   Nitrite NEGATIVE NEGATIVE   Leukocytes, UA TRACE (A) NEGATIVE   RBC / HPF 6-30 0 - 5 RBC/hpf   WBC, UA 0-5 0 - 5 WBC/hpf   Bacteria, UA NONE SEEN NONE SEEN   Squamous Epithelial / LPF 0-5 (A) NONE SEEN   Mucous PRESENT   A: Bacterial vaginosis Lower back pain due to MSK etiology  P: Flagyl 500 mgm BID x 7 days with food, no alcohol Flexeril 5-10 mgm q8 hours prn back pain OK to use Biofreeze on back, ice, heat [facility]  Procedures:none  Discharge Condition: stable  Disposition: 01-Home or Self Care  Diet: Regular diet  Discharge Activity: Activity as tolerated     Medication List    TAKE these medications   cyclobenzaprine 10 MG tablet Commonly known as:  FLEXERIL Take 0.5-1 tablets (  5-10 mg total) by mouth every 8 (eight) hours as needed for muscle spasms.   DICLEGIS 10-10 MG Tbec Generic drug:  Doxylamine-Pyridoxine Take 2 capsules by mouth at bedtime as needed. Can also take one capsule in Am and another in afternoon if needed   metroNIDAZOLE 500 MG tablet Commonly known as:  FLAGYL Take 1 tablet (500 mg total) by mouth 2 (two) times daily.   PREPLUS 27-1 MG Tabs Take 1 tablet by mouth daily.   promethazine 12.5 MG tablet Commonly known as:   PHENERGAN Take 12.5 mg by mouth every 6 (six) hours as needed for nausea or vomiting.   ranitidine 150 MG tablet Commonly known as:  ZANTAC Take 150 mg by mouth daily.      Follow-up Information    [facility] on 06/04/2016.   Why:  please keep scheduled OB appointment Contact information: [address] [place] [address] [phone]           Total time spent taking care of this patient: 20 minutes  Signed: Farrel ConnersGUTIERREZ, Shem Plemmons 05/18/2016, 10:55 PM

## 2016-05-18 NOTE — OB Triage Note (Signed)
Patient came in for observation for lower abdominal pain. Patient rates pain 5 out of 10. Patient states the pain started three days ago. Patient denies uterine contractions. Patient complains of clear leaking of fluid but denies vaginal bleeding and spotting. Vital signs stable and patient afebrile. Doppler 145. Family at bedside. Will continue to monitor.

## 2016-05-18 NOTE — Discharge Summary (Signed)
Patient discharged with instructions on new prescription, oral hydration, labor precautions, and when to seek medical attention. Patient ambulatory at discharge with steady gait. Patient discharged with significant other and daughter.

## 2016-06-22 NOTE — L&D Delivery Note (Signed)
Delivery Note Primary OB: [facility] Delivery Physician: Annamarie Major, MD Gestational Age: Full term Antepartum complications: none Intrapartum complications: None  A viable Female was delivered via vertex perentation.  Apgars:8 ,9  Weight:  pending .   Placenta status: spontaneous and Intact.  Cord: 3+ vessels;  with the following complications: nuchal.  Anesthesia:  epidural Episiotomy:  none Lacerations:  none Suture Repair: none Est. Blood Loss (mL):  less than 100 mL  Mom to postpartum.  Baby to Couplet care / Skin to Skin.  Annamarie Major, MD Dept of OB/GYN [phone]

## 2016-07-31 ENCOUNTER — Encounter: Payer: Self-pay | Admitting: *Deleted

## 2016-07-31 ENCOUNTER — Encounter: Payer: Medicaid Other | Attending: Obstetrics and Gynecology | Admitting: *Deleted

## 2016-07-31 VITALS — BP 110/60 | Ht 64.0 in | Wt 191.8 lb

## 2016-07-31 DIAGNOSIS — Z713 Dietary counseling and surveillance: Secondary | ICD-10-CM | POA: Insufficient documentation

## 2016-07-31 DIAGNOSIS — O24410 Gestational diabetes mellitus in pregnancy, diet controlled: Secondary | ICD-10-CM

## 2016-07-31 DIAGNOSIS — O24419 Gestational diabetes mellitus in pregnancy, unspecified control: Secondary | ICD-10-CM | POA: Insufficient documentation

## 2016-07-31 DIAGNOSIS — Z6832 Body mass index (BMI) 32.0-32.9, adult: Secondary | ICD-10-CM | POA: Insufficient documentation

## 2016-07-31 DIAGNOSIS — Z3A00 Weeks of gestation of pregnancy not specified: Secondary | ICD-10-CM | POA: Diagnosis not present

## 2016-07-31 NOTE — Patient Instructions (Signed)
Read booklet on Gestational Diabetes Follow Gestational Meal Planning Guidelines Limit fried foods and desserts/sweets Avoid fruit juice and sugar sweetened drinks Complete a 3 Day Food Record and bring to next appointment Check blood sugars 4 x day - before breakfast and 2 hrs after every meal and record  Bring blood sugar log to all appointments Call MD for prescription for meter strips and lancets Strips  Accu-Chek Guide Lancets   Accu-Chek Fastclix Purchase urine ketone strips if blood sugars not controlled and check urine ketones every am:  If + increase bedtime snack to 1 protein and 2 carbohydrate servings Walk 20-30 minutes at least 5 x week if permitted by MD

## 2016-07-31 NOTE — Progress Notes (Signed)
Diabetes Self-Management Education  Visit Type: First/Initial  Appt. Start Time: 1015 Appt. End Time: 1150  07/31/2016  [name], identified by name and date of birth, is a 26 y.o. female with a diagnosis of Diabetes: Gestational Diabetes.   ASSESSMENT  Blood pressure 110/60, height 5\' 4"  (1.626 m), weight 191 lb 12.8 oz (87 kg), last menstrual period 12/19/2015. Body mass index is 32.92 kg/m.      Diabetes Self-Management Education - 07/31/16 1314      Visit Information   Visit Type First/Initial     Initial Visit   Diabetes Type Gestational Diabetes   Are you currently following a meal plan? No   Are you taking your medications as prescribed? Yes   Date Diagnosed Jan 2018     Health Coping   How would you rate your overall health? Fair     Psychosocial Assessment   Patient Belief/Attitude about Diabetes Other (comment)  "not sure"   Self-care barriers None   Self-management support Doctor's office;Family   Patient Concerns Nutrition/Meal planning;Glycemic Control;Monitoring   Special Needs None   Preferred Learning Style Auditory   Learning Readiness Ready   How often do you need to have someone help you when you read instructions, pamphlets, or other written materials from your doctor or pharmacy? 1 - Never   What is the last grade level you completed in school? 8th grade     Pre-Education Assessment   Patient understands the diabetes disease and treatment process. Needs Instruction   Patient understands incorporating nutritional management into lifestyle. Needs Instruction   Patient undertands incorporating physical activity into lifestyle. Needs Instruction   Patient understands using medications safely. Needs Instruction   Patient understands monitoring blood glucose, interpreting and using results Needs Instruction   Patient understands prevention, detection, and treatment of acute complications. Needs Instruction   Patient understands prevention,  detection, and treatment of chronic complications. Needs Instruction   Patient understands how to develop strategies to address psychosocial issues. Needs Instruction   Patient understands how to develop strategies to promote health/change behavior. Needs Instruction     Complications   How often do you check your blood sugar? 0 times/day (not testing)  Provided Accu-Chek Guide meter and instructed on use. BG upon return demonstration was 115 mg/dL at 62:9511:35 am - 2 hrs pp.    Have you had a dilated eye exam in the past 12 months? No   Have you had a dental exam in the past 12 months? Yes   Are you checking your feet? No     Dietary Intake   Breakfast peanut butter or cheese crackers; bacon or sausage biscuit   Snack (morning) sweets/snack foods   Lunch fried chicken and fries; chicken quesadilla; chicken and couscous   Snack (afternoon) sweets/snack foods   Dinner same as lunch or microwave meal   Beverage(s) regular soda, fruit juice; sugar in coffee, milk     Exercise   Exercise Type ADL's     Patient Education   Previous Diabetes Education No   Disease state  Definition of diabetes, type 1 and 2, and the diagnosis of diabetes;Factors that contribute to the development of diabetes   Nutrition management  Role of diet in the treatment of diabetes and the relationship between the three main macronutrients and blood glucose level   Physical activity and exercise  Role of exercise on diabetes management, blood pressure control and cardiac health.   Monitoring Taught/evaluated SMBG meter.;Purpose and frequency of SMBG.;Taught/discussed  recording of test results and interpretation of SMBG.;Ketone testing, when, how.   Chronic complications Relationship between chronic complications and blood glucose control   Psychosocial adjustment Identified and addressed patients feelings and concerns about diabetes   Preconception care Pregnancy and GDM  Role of pre-pregnancy blood glucose control on the  development of the fetus;Reviewed with patient blood glucose goals with pregnancy;Role of family planning for patients with diabetes     Individualized Goals (developed by patient)   Reducing Risk Improve blood sugars Prevent diabetes complications     Outcomes   Expected Outcomes Demonstrated interest in learning. Expect positive outcomes      Individualized Plan for Diabetes Self-Management Training:   Learning Objective:  Patient will have a greater understanding of diabetes self-management. Patient education plan is to attend individual and/or group sessions per assessed needs and concerns.   Plan:   Patient Instructions  Read booklet on Gestational Diabetes Follow Gestational Meal Planning Guidelines Limit fried foods and desserts/sweets Avoid fruit juice and sugar sweetened drinks Complete a 3 Day Food Record and bring to next appointment Check blood sugars 4 x day - before breakfast and 2 hrs after every meal and record  Bring blood sugar log to all appointments Call MD for prescription for meter strips and lancets Strips  Accu-Chek Guide Lancets   Accu-Chek Fastclix Purchase urine ketone strips if blood sugars not controlled and check urine ketones every am:  If + increase bedtime snack to 1 protein and 2 carbohydrate servings Walk 20-30 minutes at least 5 x week if permitted by MD   Expected Outcomes:  Demonstrated interest in learning. Expect positive outcomes  Education material provided:  Gestational Booklet Gestational Meal Planning Guidelines Viewed Gestational Diabetes Video Meter = Accu-Chek Guide 3 Day Food Record Goals for a Healthy Pregnancy    If problems or questions, patient to contact team via:  Sharion Settler, RN, CCM, CDE [phone]  Future DSME appointment:  Thursday August 06, 2016 with dietitian

## 2016-08-06 ENCOUNTER — Ambulatory Visit: Payer: Medicaid Other | Admitting: Dietician

## 2016-08-07 ENCOUNTER — Ambulatory Visit: Payer: Medicaid Other | Admitting: Dietician

## 2016-08-12 ENCOUNTER — Encounter: Payer: Self-pay | Admitting: Dietician

## 2016-08-12 NOTE — Progress Notes (Signed)
Patient missed 2 consecutive appointments, on consecutive days; sent discharge letter to MD.

## 2016-08-28 ENCOUNTER — Ambulatory Visit (INDEPENDENT_AMBULATORY_CARE_PROVIDER_SITE_OTHER): Payer: Medicaid Other | Admitting: Advanced Practice Midwife

## 2016-08-28 VITALS — BP 118/74 | Wt 194.0 lb

## 2016-08-28 DIAGNOSIS — B977 Papillomavirus as the cause of diseases classified elsewhere: Secondary | ICD-10-CM | POA: Insufficient documentation

## 2016-08-28 DIAGNOSIS — K219 Gastro-esophageal reflux disease without esophagitis: Secondary | ICD-10-CM | POA: Insufficient documentation

## 2016-08-28 DIAGNOSIS — Z3A36 36 weeks gestation of pregnancy: Secondary | ICD-10-CM

## 2016-08-28 DIAGNOSIS — Z113 Encounter for screening for infections with a predominantly sexual mode of transmission: Secondary | ICD-10-CM

## 2016-08-28 DIAGNOSIS — Z3685 Encounter for antenatal screening for Streptococcus B: Secondary | ICD-10-CM

## 2016-08-28 LAB — OB RESULTS CONSOLE GBS: GBS: NEGATIVE

## 2016-08-28 NOTE — Progress Notes (Signed)
Gbs/aptima today 

## 2016-08-28 NOTE — Progress Notes (Signed)
Feeling better from upper resp symptoms. Occasional cough. Having increased cramping which she says is what she had with other labor. Reviewed labor precautions.

## 2016-09-01 ENCOUNTER — Encounter: Payer: Self-pay | Admitting: Advanced Practice Midwife

## 2016-09-01 LAB — STREP GP B CULTURE+RFLX: STREP GP B CULTURE+RFLX: NEGATIVE

## 2016-09-01 LAB — GC/CHLAMYDIA PROBE AMP
Chlamydia trachomatis, NAA: NEGATIVE
Neisseria gonorrhoeae by PCR: NEGATIVE

## 2016-09-04 ENCOUNTER — Ambulatory Visit (INDEPENDENT_AMBULATORY_CARE_PROVIDER_SITE_OTHER): Payer: Medicaid Other | Admitting: Certified Nurse Midwife

## 2016-09-04 VITALS — BP 104/64 | Wt 190.0 lb

## 2016-09-04 DIAGNOSIS — N898 Other specified noninflammatory disorders of vagina: Secondary | ICD-10-CM

## 2016-09-04 DIAGNOSIS — Z9119 Patient's noncompliance with other medical treatment and regimen: Secondary | ICD-10-CM

## 2016-09-04 DIAGNOSIS — F319 Bipolar disorder, unspecified: Secondary | ICD-10-CM

## 2016-09-04 DIAGNOSIS — B9689 Other specified bacterial agents as the cause of diseases classified elsewhere: Secondary | ICD-10-CM

## 2016-09-04 DIAGNOSIS — O24919 Unspecified diabetes mellitus in pregnancy, unspecified trimester: Secondary | ICD-10-CM

## 2016-09-04 DIAGNOSIS — Z91199 Patient's noncompliance with other medical treatment and regimen due to unspecified reason: Secondary | ICD-10-CM

## 2016-09-04 DIAGNOSIS — O0990 Supervision of high risk pregnancy, unspecified, unspecified trimester: Secondary | ICD-10-CM

## 2016-09-04 DIAGNOSIS — N760 Acute vaginitis: Secondary | ICD-10-CM

## 2016-09-04 DIAGNOSIS — O26893 Other specified pregnancy related conditions, third trimester: Secondary | ICD-10-CM

## 2016-09-04 DIAGNOSIS — O24419 Gestational diabetes mellitus in pregnancy, unspecified control: Secondary | ICD-10-CM

## 2016-09-04 NOTE — Progress Notes (Signed)
Pt c/o feeling crampy like last week. Desires cervical check.

## 2016-09-05 ENCOUNTER — Encounter: Payer: Self-pay | Admitting: Certified Nurse Midwife

## 2016-09-05 DIAGNOSIS — F319 Bipolar disorder, unspecified: Secondary | ICD-10-CM | POA: Insufficient documentation

## 2016-09-05 DIAGNOSIS — O24419 Gestational diabetes mellitus in pregnancy, unspecified control: Secondary | ICD-10-CM

## 2016-09-05 DIAGNOSIS — O0990 Supervision of high risk pregnancy, unspecified, unspecified trimester: Secondary | ICD-10-CM | POA: Insufficient documentation

## 2016-09-05 DIAGNOSIS — Z6791 Unspecified blood type, Rh negative: Secondary | ICD-10-CM | POA: Insufficient documentation

## 2016-09-05 DIAGNOSIS — O26899 Other specified pregnancy related conditions, unspecified trimester: Secondary | ICD-10-CM

## 2016-09-05 HISTORY — DX: Gestational diabetes mellitus in pregnancy, unspecified control: O24.419

## 2016-09-05 NOTE — Progress Notes (Signed)
S: Complains of cramping and increased discharge. Not sure if water is leaking. Having a little itching. Was treated for a monilial infection the end of Feb. Baby active No vaginal bleeding. DId not bring CBG log. States her fasting levels are 80s to 95. Has been "experimenting to see what happens when she drinks international coffees and soda and her 2hr blood sugars "have been a little more elevated: 108-125." Exam: see flow sheet SSE: negative pooling and Nitrazine. Small amt of white homogenous discharge Wet prep positive for clue cells and negative for Trich, hyphae Cervix: int os closed/ station -1 to -2 A: Bacterial vaginosis. No evidence of SROM P: Clindamycin 300 mgm BID x 7d ROB in 1 week Labor precautions. Bring blood sugar log with her to next appt Breast/ BTL Farrel Connersolleen Sueanne Maniaci, CNM

## 2016-09-05 NOTE — Patient Instructions (Signed)
Patient instructed in how to take Clindamycin. She was also encouraged to keep and bring her blood sugar log with her at her next visit in 1 week.

## 2016-09-11 ENCOUNTER — Telehealth: Payer: Self-pay

## 2016-09-11 ENCOUNTER — Other Ambulatory Visit: Payer: Self-pay | Admitting: Certified Nurse Midwife

## 2016-09-11 ENCOUNTER — Other Ambulatory Visit (INDEPENDENT_AMBULATORY_CARE_PROVIDER_SITE_OTHER): Payer: Medicaid Other

## 2016-09-11 ENCOUNTER — Inpatient Hospital Stay
Admission: EM | Admit: 2016-09-11 | Discharge: 2016-09-11 | Disposition: A | Discharge status: Home / Self Care | Payer: Medicaid Other | Attending: Advanced Practice Midwife | Admitting: Advanced Practice Midwife

## 2016-09-11 ENCOUNTER — Ambulatory Visit (INDEPENDENT_AMBULATORY_CARE_PROVIDER_SITE_OTHER): Payer: Medicaid Other | Admitting: Certified Nurse Midwife

## 2016-09-11 VITALS — BP 128/80 | Wt 195.0 lb

## 2016-09-11 DIAGNOSIS — N898 Other specified noninflammatory disorders of vagina: Secondary | ICD-10-CM | POA: Diagnosis present

## 2016-09-11 DIAGNOSIS — O26899 Other specified pregnancy related conditions, unspecified trimester: Secondary | ICD-10-CM

## 2016-09-11 DIAGNOSIS — O26893 Other specified pregnancy related conditions, third trimester: Secondary | ICD-10-CM

## 2016-09-11 DIAGNOSIS — Z880 Allergy status to penicillin: Secondary | ICD-10-CM | POA: Insufficient documentation

## 2016-09-11 DIAGNOSIS — Z3A38 38 weeks gestation of pregnancy: Secondary | ICD-10-CM | POA: Insufficient documentation

## 2016-09-11 DIAGNOSIS — Z79899 Other long term (current) drug therapy: Secondary | ICD-10-CM | POA: Insufficient documentation

## 2016-09-11 DIAGNOSIS — Z882 Allergy status to sulfonamides status: Secondary | ICD-10-CM | POA: Insufficient documentation

## 2016-09-11 DIAGNOSIS — K219 Gastro-esophageal reflux disease without esophagitis: Secondary | ICD-10-CM | POA: Insufficient documentation

## 2016-09-11 DIAGNOSIS — O24419 Gestational diabetes mellitus in pregnancy, unspecified control: Secondary | ICD-10-CM

## 2016-09-11 DIAGNOSIS — O99343 Other mental disorders complicating pregnancy, third trimester: Secondary | ICD-10-CM | POA: Insufficient documentation

## 2016-09-11 DIAGNOSIS — Z881 Allergy status to other antibiotic agents status: Secondary | ICD-10-CM | POA: Diagnosis not present

## 2016-09-11 DIAGNOSIS — O0993 Supervision of high risk pregnancy, unspecified, third trimester: Secondary | ICD-10-CM

## 2016-09-11 DIAGNOSIS — O99613 Diseases of the digestive system complicating pregnancy, third trimester: Secondary | ICD-10-CM | POA: Insufficient documentation

## 2016-09-11 DIAGNOSIS — F319 Bipolar disorder, unspecified: Secondary | ICD-10-CM | POA: Insufficient documentation

## 2016-09-11 DIAGNOSIS — O26843 Uterine size-date discrepancy, third trimester: Secondary | ICD-10-CM | POA: Diagnosis not present

## 2016-09-11 DIAGNOSIS — O0990 Supervision of high risk pregnancy, unspecified, unspecified trimester: Secondary | ICD-10-CM

## 2016-09-11 DIAGNOSIS — Z6791 Unspecified blood type, Rh negative: Secondary | ICD-10-CM

## 2016-09-11 NOTE — OB Triage Note (Signed)
Ms. Dutch Quintoole here with c/o "leaking and increased discharge for past 4-5 days" states "they told me to increase my fluid intake, but when I do, more leaks out" Denies bleeding, reports decreased fetal movement. Pt states she saw CLG, CNM in office today and had U/S. This RN asked pt if she told C. Sharen HonesGutierrez, CNM about leaking, received vague reply about being told to increase fluid intake. Denies having nitrazine test today.

## 2016-09-11 NOTE — Telephone Encounter (Signed)
Pt was seen today and her fluid was low.  The front desk was unable to give her an u/s appt until 4/3rd.  Her EDD is 4/5th.  She is wondering if she needs and IOL date scheduled if nothing happens between now and 4/3rd.  [phone]

## 2016-09-11 NOTE — Progress Notes (Signed)
S: "worried about baby's well being". "I have been on so many antibiotics, this pregnancy and this baby is a lot smaller." Brought log of CBGs. Has cut out coffe drinks and soda and blood sugars are WNL. FBSs 83-93, and 2hr pp range is 93-119 with one 124.  Irregular contractions. No VB. Completed course of clindamycin for BV. Has not really noticed a difference in discharge. Baby active O: FH at 32/ FHT 142/ baby vertex Ultrasound today fror fetal growth: EFW 7#2oz (48.2%), with AFI=7.68cm with one pocket 4.36cm Cervix: 1.5/70%/-1 A/P Reassured patient of normal growth and normal AFI. Encouraged more water intake Labor precautions ROB in 1 week

## 2016-09-11 NOTE — Discharge Summary (Signed)
Physician Final Progress Note  [name] MRN: [id number] DOB/AGE: 25/10/1990 25 y.o.  Admit date: 09/11/2016 Admitting provider: Tresea MallJane Leela Vanbrocklin, CNM Discharge date: 09/11/2016   Admission Diagnoses: fluid leaking  Discharge Diagnoses:  Active Problems:   * No active hospital problems. * IUP with reactive NST, membranes intact  History of Present Illness: The patient is a 26 y.o. female G3P2002 at 5247w1d who presents for c/o small amounts of fluid leaking for the past several days. She was seen in clinic today for ROB visit. She had a growth scan and AFI today. She is concerned that her amniotic fluid level is low and it is because she is leaking. AFI was 7.68cm today. She has been increasing her hydration as recommended and then she notices more fluid coming out.    Past Medical History:  Diagnosis Date  . Bipolar depression (HCC)   . GERD (gastroesophageal reflux disease)   . Gestational diabetes   . History of macrosomia in infant in prior pregnancy, currently pregnant   . 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 Scoliosis     Past Surgical History:  Procedure Laterality Date  . ADENOIDECTOMY    . 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 TONSILLECTOMY    . WISDOM TOOTH EXTRACTION      No current facility-administered medications on file prior to encounter.    Current Outpatient Prescriptions on File Prior to Encounter  Medication Sig Dispense Refill  . ACCU-CHEK FASTCLIX LANCETS MISC   0  . ACCU-CHEK GUIDE test strip   0  . lurasidone (LATUDA) 20 MG TABS tablet Take 20 mg by mouth at bedtime.    Burnis Medin. Prenat w/o A Vit-FeFum-FePo-FA (PROVIDA OB) 20-20-1.25 MG CAPS TK ONE C PO QD BETWEEN MEALS  11  . ranitidine (ZANTAC) 150 MG tablet Take 150 mg by mouth 2 (two) times daily.     . [DISCONTINUED] propranolol ER (INDERAL LA) 60 MG 24 hr capsule Take 1 capsule (60 mg total) by mouth at bedtime. To decrease frequency/severity of headaches. 30 capsule 0    Allergies   Allergen Reactions  . Sulfa Antibiotics Shortness Of Breath  . Sulfacetamide Sodium Shortness Of Breath  . Doxycycline Swelling  . Cantaloupe (Diagnostic) Swelling  . Amoxicillin-Pot Clavulanate Other (See Comments)  . Penicillins Other (See Comments)    Social History   Social History  . Marital status: Single    Spouse name: N/A  . Number of children: N/A  . Years of education: N/A   Occupational History  . Not on file.   Social History Main Topics  . Smoking status: Never Smoker  . Smokeless tobacco: Never Used  . Alcohol use No  . Drug use: No  . Sexual activity: Yes   Other Topics Concern  . Not on file   Social History Narrative  . No narrative on file    Physical Exam: Temp 98.2 F (36.8 C) (Oral)   Resp 20   LMP 12/19/2015 (LMP Unknown)   Gen: NAD CV: RRR Pulm: CTAB Pelvic: 1.5/70/-2 Toco: occasional contraction Fetal Well Being: 140 bpm, moderate variability, +accelerations, -decelerations Nitrazine negative Ext: no evidence of DVT  Consults: None  Significant Findings/ Diagnostic Studies: none  Procedures: NST  Discharge Condition: good  Disposition: 01-Home or Self Care  Diet: Regular diet  Discharge Activity: Activity as tolerated  Discharge Instructions    Discharge activity:  No Restrictions    Complete by:  As directed  Discharge diet:  No restrictions    Complete by:  As directed    Fetal Kick Count:  Lie on our left side for one hour after a meal, and count the number of times your baby kicks.  If it is less than 5 times, get up, move around and drink some juice.  Repeat the test 30 minutes later.  If it is still less than 5 kicks in an hour, notify your doctor.    Complete by:  As directed    LABOR:  When conractions begin, you should start to time them from the beginning of one contraction to the beginning  of the next.  When contractions are 5 - 10 minutes apart or less and have been regular for at least an hour, you should  call your health care provider.    Complete by:  As directed    No sexual activity restrictions    Complete by:  As directed    Notify physician for bleeding from the vagina    Complete by:  As directed    Notify physician for blurring of vision or spots before the eyes    Complete by:  As directed    Notify physician for chills or fever    Complete by:  As directed    Notify physician for fainting spells, "black outs" or loss of consciousness    Complete by:  As directed    Notify physician for increase in vaginal discharge    Complete by:  As directed    Notify physician for leaking of fluid    Complete by:  As directed    Notify physician for pain or burning when urinating    Complete by:  As directed    Notify physician for pelvic pressure (sudden increase)    Complete by:  As directed    Notify physician for severe or continued nausea or vomiting    Complete by:  As directed    Notify physician for sudden gushing of fluid from the vagina (with or without continued leaking)    Complete by:  As directed    Notify physician for sudden, constant, or occasional abdominal pain    Complete by:  As directed    Notify physician if baby moving less than usual    Complete by:  As directed      Allergies as of 09/11/2016      Reactions   Sulfa Antibiotics Shortness Of Breath   Sulfacetamide Sodium Shortness Of Breath   Doxycycline Swelling   Cantaloupe (diagnostic) Swelling   Amoxicillin-pot Clavulanate Other (See Comments)   Penicillins Other (See Comments)      Medication List    TAKE these medications   ACCU-CHEK FASTCLIX LANCETS Misc   ACCU-CHEK GUIDE test strip Generic drug:  glucose blood   LATUDA 20 MG Tabs tablet Generic drug:  lurasidone Take 20 mg by mouth at bedtime.   PROVIDA OB 20-20-1.25 MG Caps TK ONE C PO QD BETWEEN MEALS   ranitidine 150 MG tablet Commonly known as:  ZANTAC Take 150 mg by mouth 2 (two) times daily.      Follow-up Information     [facility] Follow up.   Why:  go to regular scheduled prenatal appointment Contact information: [address] [place] [address] [phone]        Reassurance given to patient that leaking fluid is not amniotic and AFI is normal.   Total time spent taking care of this patient:  15 minutes  Signed: Tresea Mall, CNM  09/11/2016, 6:32 PM

## 2016-09-14 ENCOUNTER — Observation Stay
Admission: EM | Admit: 2016-09-14 | Discharge: 2016-09-14 | Disposition: A | Discharge status: Home / Self Care | Payer: Medicaid Other | Attending: Obstetrics and Gynecology | Admitting: Obstetrics and Gynecology

## 2016-09-14 DIAGNOSIS — O99343 Other mental disorders complicating pregnancy, third trimester: Secondary | ICD-10-CM | POA: Insufficient documentation

## 2016-09-14 DIAGNOSIS — Z3A38 38 weeks gestation of pregnancy: Secondary | ICD-10-CM | POA: Diagnosis not present

## 2016-09-14 DIAGNOSIS — O26893 Other specified pregnancy related conditions, third trimester: Principal | ICD-10-CM | POA: Insufficient documentation

## 2016-09-14 DIAGNOSIS — K219 Gastro-esophageal reflux disease without esophagitis: Secondary | ICD-10-CM | POA: Diagnosis not present

## 2016-09-14 DIAGNOSIS — Z91018 Allergy to other foods: Secondary | ICD-10-CM | POA: Insufficient documentation

## 2016-09-14 DIAGNOSIS — F319 Bipolar disorder, unspecified: Secondary | ICD-10-CM | POA: Insufficient documentation

## 2016-09-14 DIAGNOSIS — Z880 Allergy status to penicillin: Secondary | ICD-10-CM | POA: Insufficient documentation

## 2016-09-14 DIAGNOSIS — M419 Scoliosis, unspecified: Secondary | ICD-10-CM | POA: Insufficient documentation

## 2016-09-14 DIAGNOSIS — Z881 Allergy status to other antibiotic agents status: Secondary | ICD-10-CM | POA: Insufficient documentation

## 2016-09-14 DIAGNOSIS — N898 Other specified noninflammatory disorders of vagina: Secondary | ICD-10-CM

## 2016-09-14 NOTE — OB Triage Note (Signed)
Alyssa Kemp here with c/o leaking fluid, reports it as "color of skim milk", small amt, nitrazine is negative, no visible fluid or discharge. Reports positive fetal movement, denies bleeding.

## 2016-09-14 NOTE — Discharge Summary (Signed)
Physician Final Progress Note  [name] MRN: [id number] DOB/AGE: 25/10/1990 26 y.o.  Admit date: 09/14/2016 Admitting provider: Tresea MallJane Termaine Roupp, CNM Discharge date: 09/14/2016   Admission Diagnoses: fluid leaking  Discharge Diagnoses:  Active Problems:   Labor and delivery indication for care or intervention IUP with reactive NST, membranes intact  History of Present Illness: The patient is a 26 y.o. female G3P2002 at 1426w1d who presents for c/o small amounts of fluid leaking this morning. She describes the fluid as the color of skim milk.   Past Medical History:  Diagnosis Date  . Bipolar depression (HCC)   . GERD (gastroesophageal reflux disease)   . Gestational diabetes   . History of macrosomia in infant in prior pregnancy, currently pregnant   . 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 Scoliosis     Past Surgical History:  Procedure Laterality Date  . ADENOIDECTOMY    . 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 TONSILLECTOMY    . WISDOM TOOTH EXTRACTION      No current facility-administered medications on file prior to encounter.    Current Outpatient Prescriptions on File Prior to Encounter  Medication Sig Dispense Refill  . ACCU-CHEK FASTCLIX LANCETS MISC   0  . ACCU-CHEK GUIDE test strip   0  . lurasidone (LATUDA) 20 MG TABS tablet Take 20 mg by mouth at bedtime.    Burnis Medin. Prenat w/o A Vit-FeFum-FePo-FA (PROVIDA OB) 20-20-1.25 MG CAPS TK ONE C PO QD BETWEEN MEALS  11  . ranitidine (ZANTAC) 150 MG tablet Take 150 mg by mouth 2 (two) times daily.     . [DISCONTINUED] propranolol ER (INDERAL LA) 60 MG 24 hr capsule Take 1 capsule (60 mg total) by mouth at bedtime. To decrease frequency/severity of headaches. 30 capsule 0    Allergies  Allergen Reactions  . Sulfa Antibiotics Shortness Of Breath  . Sulfacetamide Sodium Shortness Of Breath  . Doxycycline Swelling  . Cantaloupe (Diagnostic) Swelling  . Amoxicillin-Pot Clavulanate Other (See Comments)  .  Penicillins Other (See Comments)    Social History   Social History  . Marital status: Single    Spouse name: N/A  . Number of children: N/A  . Years of education: N/A   Occupational History  . Not on file.   Social History Main Topics  . Smoking status: Never Smoker  . Smokeless tobacco: Never Used  . Alcohol use No  . Drug use: No  . Sexual activity: Yes   Other Topics Concern  . Not on file   Social History Narrative  . No narrative on file    Physical Exam: BP 127/62 (BP Location: Right Arm)   Pulse 80   Temp 98 F (36.7 C) (Oral)   Resp 18   LMP 12/19/2015 (LMP Unknown)   Gen: NAD CV: RRR Pulm: CTAB Pelvic: deferred Toco: occasional contraction Fetal Well Being: 140 bpm, moderate variability, +accelerations, -decelerations Nitrazine negative x2 Ext: no evidence of DVT  Consults: None  Significant Findings/ Diagnostic Studies: none  Procedures: NST  Discharge Condition: good  Disposition: 01-Home or Self Care  Diet: Regular diet  Discharge Activity: Activity as tolerated  Continue taking prenatal vitamin and go to regular scheduled prenatal appointment  Reassurance given to patient that leaking fluid is not amniotic. Discussion of normal vaginal discharge and the possibility of urine leaking.   Total time spent taking care of this patient: 15 minutes  Signed: Tresea MallJane Birtha Hatler, CNM  09/14/2016, 1:49  PM

## 2016-09-14 NOTE — Telephone Encounter (Signed)
Pt aware. AFI scheduled 09/22/16 11:00 and ROB w/CLG 11:30

## 2016-09-14 NOTE — Progress Notes (Signed)
Called to pt room for additional nitrazine test, pt c/o leaking. Visible white discharge at introitus, nitrazine repeat negative

## 2016-09-14 NOTE — Telephone Encounter (Signed)
This appointment date is fine and induction date can be discussed at this visit.

## 2016-09-18 ENCOUNTER — Inpatient Hospital Stay: Payer: Medicaid Other | Admitting: Anesthesiology

## 2016-09-18 ENCOUNTER — Encounter: Payer: Self-pay | Admitting: *Deleted

## 2016-09-18 ENCOUNTER — Inpatient Hospital Stay
Admission: EM | Admit: 2016-09-18 | Discharge: 2016-09-20 | DRG: 767 | Disposition: A | Discharge status: Home / Self Care | Payer: Medicaid Other | Attending: Obstetrics & Gynecology | Admitting: Obstetrics & Gynecology

## 2016-09-18 DIAGNOSIS — F319 Bipolar disorder, unspecified: Secondary | ICD-10-CM | POA: Diagnosis present

## 2016-09-18 DIAGNOSIS — Z3493 Encounter for supervision of normal pregnancy, unspecified, third trimester: Secondary | ICD-10-CM | POA: Diagnosis present

## 2016-09-18 DIAGNOSIS — Z3A39 39 weeks gestation of pregnancy: Secondary | ICD-10-CM

## 2016-09-18 DIAGNOSIS — O99344 Other mental disorders complicating childbirth: Secondary | ICD-10-CM | POA: Diagnosis present

## 2016-09-18 DIAGNOSIS — O9081 Anemia of the puerperium: Secondary | ICD-10-CM | POA: Diagnosis not present

## 2016-09-18 DIAGNOSIS — O9962 Diseases of the digestive system complicating childbirth: Secondary | ICD-10-CM | POA: Diagnosis present

## 2016-09-18 DIAGNOSIS — O24429 Gestational diabetes mellitus in childbirth, unspecified control: Principal | ICD-10-CM | POA: Diagnosis present

## 2016-09-18 DIAGNOSIS — Z6791 Unspecified blood type, Rh negative: Secondary | ICD-10-CM

## 2016-09-18 DIAGNOSIS — K219 Gastro-esophageal reflux disease without esophagitis: Secondary | ICD-10-CM | POA: Diagnosis present

## 2016-09-18 DIAGNOSIS — O24419 Gestational diabetes mellitus in pregnancy, unspecified control: Secondary | ICD-10-CM

## 2016-09-18 DIAGNOSIS — M419 Scoliosis, unspecified: Secondary | ICD-10-CM | POA: Diagnosis present

## 2016-09-18 DIAGNOSIS — Z302 Encounter for sterilization: Secondary | ICD-10-CM | POA: Diagnosis not present

## 2016-09-18 DIAGNOSIS — O26899 Other specified pregnancy related conditions, unspecified trimester: Secondary | ICD-10-CM

## 2016-09-18 LAB — CBC
HCT: 36.2 % (ref 35.0–47.0)
Hemoglobin: 12.9 g/dL (ref 12.0–16.0)
MCH: 30.4 pg (ref 26.0–34.0)
MCHC: 35.6 g/dL (ref 32.0–36.0)
MCV: 85.6 fL (ref 80.0–100.0)
PLATELETS: 259 10*3/uL (ref 150–440)
RBC: 4.23 MIL/uL (ref 3.80–5.20)
RDW: 13.9 % (ref 11.5–14.5)
WBC: 23.6 10*3/uL — ABNORMAL HIGH (ref 3.6–11.0)

## 2016-09-18 MED ORDER — OXYTOCIN 10 UNIT/ML IJ SOLN
INTRAMUSCULAR | Status: AC
  Filled 2016-09-18: qty 2

## 2016-09-18 MED ORDER — OXYTOCIN 40 UNITS IN LACTATED RINGERS INFUSION - SIMPLE MED
2.5000 [IU]/h | INTRAVENOUS | Status: DC
  Filled 2016-09-18 (×2): qty 1000

## 2016-09-18 MED ORDER — FENTANYL 2.5 MCG/ML W/ROPIVACAINE 0.2% IN NS 100 ML EPIDURAL INFUSION (ARMC-ANES)
10.0000 mL/h | EPIDURAL | Status: DC
  Administered 2016-09-18: 10 mL/h via EPIDURAL

## 2016-09-18 MED ORDER — MISOPROSTOL 200 MCG PO TABS
ORAL_TABLET | ORAL | Status: AC
  Filled 2016-09-18: qty 4

## 2016-09-18 MED ORDER — LACTATED RINGERS IV SOLN
INTRAVENOUS | Status: DC
Start: 2016-09-18 — End: 2016-09-19
  Administered 2016-09-18: 20:00:00 via INTRAVENOUS

## 2016-09-18 MED ORDER — EPHEDRINE 5 MG/ML INJ
10.0000 mg | INTRAVENOUS | Status: DC | PRN
  Filled 2016-09-18: qty 2

## 2016-09-18 MED ORDER — BUPIVACAINE HCL (PF) 0.25 % IJ SOLN
INTRAMUSCULAR | Status: DC | PRN
  Administered 2016-09-18 (×2): 4 mL via EPIDURAL

## 2016-09-18 MED ORDER — LACTATED RINGERS IV SOLN: 500.0000 mL | INTRAVENOUS | Status: DC | PRN

## 2016-09-18 MED ORDER — LIDOCAINE-EPINEPHRINE (PF) 1.5 %-1:200000 IJ SOLN
INTRAMUSCULAR | Status: DC | PRN
  Administered 2016-09-18: 4 mL via EPIDURAL

## 2016-09-18 MED ORDER — PHENYLEPHRINE 40 MCG/ML (10ML) SYRINGE FOR IV PUSH (FOR BLOOD PRESSURE SUPPORT)
80.0000 ug | PREFILLED_SYRINGE | INTRAVENOUS | Status: DC | PRN
  Filled 2016-09-18: qty 5

## 2016-09-18 MED ORDER — BUTORPHANOL TARTRATE 1 MG/ML IJ SOLN
1.0000 mg | INTRAMUSCULAR | Status: DC | PRN
  Administered 2016-09-18: 1 mg via INTRAVENOUS
  Filled 2016-09-18: qty 1

## 2016-09-18 MED ORDER — OXYTOCIN BOLUS FROM INFUSION: 500.0000 mL | Freq: Once | INTRAVENOUS | Status: DC

## 2016-09-18 MED ORDER — LIDOCAINE HCL (PF) 1 % IJ SOLN
INTRAMUSCULAR | Status: DC | PRN
  Administered 2016-09-18: 4 mL

## 2016-09-18 MED ORDER — LURASIDONE HCL 20 MG PO TABS
20.0000 mg | ORAL_TABLET | Freq: Every day | ORAL | Status: DC
  Administered 2016-09-19: 20 mg via ORAL
  Filled 2016-09-18 (×3): qty 1

## 2016-09-18 MED ORDER — ONDANSETRON HCL 4 MG/2ML IJ SOLN: 4.0000 mg | Freq: Four times a day (QID) | INTRAMUSCULAR | Status: DC | PRN

## 2016-09-18 MED ORDER — FENTANYL 2.5 MCG/ML W/ROPIVACAINE 0.2% IN NS 100 ML EPIDURAL INFUSION (ARMC-ANES)
EPIDURAL | Status: AC
  Filled 2016-09-18: qty 100

## 2016-09-18 MED ORDER — ACETAMINOPHEN 325 MG PO TABS: 650.0000 mg | ORAL_TABLET | ORAL | Status: DC | PRN

## 2016-09-18 MED ORDER — AMMONIA AROMATIC IN INHA
RESPIRATORY_TRACT | Status: AC
  Filled 2016-09-18: qty 10

## 2016-09-18 MED ORDER — LACTATED RINGERS IV SOLN
500.0000 mL | Freq: Once | INTRAVENOUS | Status: AC
  Administered 2016-09-18: 500 mL via INTRAVENOUS

## 2016-09-18 MED ORDER — DIPHENHYDRAMINE HCL 50 MG/ML IJ SOLN: 12.5000 mg | INTRAMUSCULAR | Status: DC | PRN

## 2016-09-18 MED ORDER — LIDOCAINE HCL (PF) 1 % IJ SOLN
INTRAMUSCULAR | Status: AC
  Filled 2016-09-18: qty 30

## 2016-09-18 NOTE — H&P (Signed)
Obstetrics Admission History & Physical   CC: contractions and pain.   HPI:  26 y.o. [id number] @ [redacted]w[redacted]d (09/24/2016, by Last Menstrual Period). Admitted on 09/18/2016:   Patient Active Problem List   Diagnosis Date Noted  . Labor and delivery indication for care or intervention 09/14/2016  . Gestational diabetes mellitus (GDM) affecting pregnancy, antepartum 09/05/2016  . High risk pregnancy, antepartum 09/05/2016   Presents for bloody show and contraction pain worsening today; no ROM; good FM.   P[facility] care at: at [facility]. Pregnancy complicated by bipolar disorder.  Prior 2 NSVD..  ROS: A review of systems was performed and negative, except as stated in the above HPI. PMHx:  Past Medical History:  Diagnosis Date  . Bipolar depression (HCC)   . GERD (gastroesophageal reflux disease)   . Gestational diabetes   . History of macrosomia in infant in prior pregnancy, currently pregnant   . 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 Scoliosis    PSHx:  Past Surgical History:  Procedure Laterality Date  . ADENOIDECTOMY    . 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 TONSILLECTOMY    . WISDOM TOOTH EXTRACTION     Medications:  Prescriptions Prior to Admission  Medication Sig Dispense Refill Last Dose  . ACCU-CHEK FASTCLIX LANCETS MISC   0 09/17/2016  . ACCU-CHEK GUIDE test strip   0 09/17/2016  . lurasidone (LATUDA) 20 MG TABS tablet Take 20 mg by mouth at bedtime.   09/17/2016  . Prenat w/o A Vit-FeFum-FePo-FA (PROVIDA OB) 20-20-1.25 MG CAPS TK ONE C PO QD BETWEEN MEALS  11 09/17/2016  . ranitidine (ZANTAC) 150 MG tablet Take 150 mg by mouth 2 (two) times daily.    09/17/2016   Allergies: is allergic to sulfa antibiotics; sulfacetamide sodium; doxycycline; cantaloupe (diagnostic); amoxicillin-pot clavulanate; and penicillins. OBHx:  OB History  Gravida Para Term Preterm AB Living  SAB TAB Ectopic Multiple Live Births          2    # Outcome Date GA Lbr Len/2nd Weight Sex  Delivery Anes PTL Lv  3 Current           2 Term 10/31/13 [redacted]w[redacted]d  7 lb 14 oz (3.572 kg) M Vag-Spont   LIV  1 Term 01/10/12 [redacted]w[redacted]d  8 lb 10 oz (3.912 kg) F Vag-Spont  N LIV     Birth Comments: IOL for BPP 2/8, meconium stained AF     AVW:UJWJXBJY/[id number] except as detailed in HPI.Marland Kitchen  No family history of birth defects. Soc Hx: Alcohol: none and Recreational drug use: none  Objective:   Vitals:   09/18/16 1835 09/18/16 1923  BP: 128/75 129/76  Pulse: (!) 105 94  Resp: 18   Temp: 97.8 F (36.6 C)    Constitutional: Well nourished, well developed female in no acute distress.  HEENT: normal Skin: Warm and dry.  Cardiovascular:Regular rate and rhythm.   Extremity: trace to 1+ bilateral pedal edema Respiratory: Clear to auscultation bilateral. Normal respiratory effort Abdomen: moderate, without guarding Back: no CVAT Neuro: DTRs 2+, Cranial nerves grossly intact Psych: Alert and Oriented x3. No memory deficits. Normal mood and affect.  MS: normal gait, normal bilateral lower extremity ROM/strength/stability.  Pelvic exam: is not limited by body habitus EGBUS: within normal limits Vagina: within normal limits and with normal mucosa blood in the vault Cervix: 3 cm Uterus: Spontaneous uterine activity  Adnexa: not evaluated  EFM:FHR: 140 bpm,  variability: moderate,  accelerations:  Present,  decelerations:  Present variable decel x2; good recovery and further monitoring reassuring Toco: Frequency: Every 5-8 minutes   Perinatal info:  Blood type: A negative Rubella- Immune Varicella -Not immune TDaP Given during third trimester of this pregnancy RPR NR / HIV Neg/ HBsAg Neg   Assessment & Plan:   26 y.o. [id number] @ [redacted]w[redacted]d, Admitted on 09/18/2016: LABOR    Admit for labor, Observe for cervical change, Fetal Wellbeing Reassuring, Epidural when ready and AROM when Appropriate   Annamarie Major, MD, [facility], [facility] 09/18/2016  7:28 PM

## 2016-09-18 NOTE — Discharge Instructions (Signed)

## 2016-09-18 NOTE — Discharge Summary (Signed)
OB Discharge Summary     Patient Name: Alyssa Kemp DOB: 10/08/1990 MRN: [id number]  Date of admission: 09/18/2016 Delivering MD: Letitia Libra, MD  Date of Delivery: 09/18/2016  Date of discharge: 09/20/2016   Admitting diagnosis: 39wks contractions desires sterilization Intrauterine pregnancy: [redacted]w[redacted]d     Secondary diagnosis: None     Discharge diagnosis: Term Pregnancy Delivered and encounter for sterilization                                                                                                Post partum procedures:postpartum tubal ligation  Augmentation: none  Complications: None  Hospital course:  Onset of Labor With Vaginal Delivery     26 y.o. yo [id number] at [redacted]w[redacted]d was admitted in Active Labor on 09/18/2016. Patient had an uncomplicated labor course as follows:   Delivery Note Primary OB: [facility] Delivery Physician: Annamarie Major, MD Gestational Age: Full term Antepartum complications: none Intrapartum complications: None  A viable Female was delivered via vertex perentation.  Apgars:8 ,9  Weight:  pending .   Placenta status: spontaneous and Intact.  Cord: 3+ vessels;  with the following complications: nuchal.  Anesthesia:  epidural Episiotomy:  none Lacerations:  none Suture Repair: none Est. Blood Loss (mL):  less than 100 mL  Mom to postpartum.  Baby to Couplet care / Skin to Skin.  Pateint had an uncomplicated postpartum course.  She is ambulating, tolerating a regular diet, passing flatus, and urinating well. On PPD#1 she underwent an uncomplicated postpartum bilateral tubal ligation.  Patient is discharged home in stable condition on PPD#2.   Physical exam  Vitals:   09/19/16 1954 09/20/16 0016 09/20/16 0412 09/20/16 0720  BP: (!) 113/54 (!) 110/59 (!) 110/54 108/61  Pulse: 94 83 92 85  Resp: Temp: 98.2 F (36.8 C) 97.6 F (36.4 C) 97.7 F (36.5 C) 98.4 F (36.9 C)  TempSrc: Oral Oral Oral Oral  SpO2: 99% 99% 98% 99%   Weight:      Height:       General: alert, cooperative and no distress Lochia: appropriate Uterine Fundus: firm Incision: Healing well with no significant drainage, No significant erythema DVT Evaluation: No evidence of DVT seen on physical exam. No cords or calf tenderness. No significant calf/ankle edema.  Labs: Lab Results  Component Value Date   WBC 22.3 (H) 09/19/2016   HGB 10.7 (L) 09/19/2016   HCT 30.0 (L) 09/19/2016   MCV 85.4 09/19/2016   PLT 206 09/19/2016   CMP Latest Ref Rng & Units 04/23/2016  Glucose 65 - 99 mg/dL 254(Y)  BUN 6 - 20 mg/dL 10  Creatinine 7.06 - 2.37 mg/dL 6.28  Sodium 315 - 176 mmol/L 133(L)  Potassium 3.5 - 5.1 mmol/L 3.7  Chloride 101 - 111 mmol/L 102  CO2 22 - 32 mmol/L 25  Calcium 8.9 - 10.3 mg/dL 1.6(W)  Total Protein 6.5 - 8.1 g/dL 6.7  Total Bilirubin 0.3 - 1.2 mg/dL 0.6  Alkaline Phos 38 - 126 U/L 62  AST 15 - 41 U/L 78(H)  ALT 14 - 54  U/L 92(H)    Discharge instruction: per After Visit Summary.  Medications:  Allergies as of 09/20/2016      Reactions   Sulfa Antibiotics Shortness Of Breath   Sulfacetamide Sodium Shortness Of Breath   Doxycycline Swelling   Cantaloupe (diagnostic) Swelling   Amoxicillin-pot Clavulanate Other (See Comments)   Penicillins Other (See Comments)      Medication List    STOP taking these medications   ACCU-CHEK FASTCLIX LANCETS Misc   ACCU-CHEK GUIDE test strip Generic drug:  glucose blood     TAKE these medications   ibuprofen 600 MG tablet Commonly known as:  ADVIL,MOTRIN Take 1 tablet (600 mg total) by mouth every 6 (six) hours as needed for mild pain or cramping.   LATUDA 20 MG Tabs tablet Generic drug:  lurasidone Take 20 mg by mouth at bedtime.   oxyCODONE-acetaminophen 5-325 MG tablet Commonly known as:  PERCOCET/ROXICET Take 1 tablet by mouth every 4 (four) hours as needed (pain scale 4-7).   PROVIDA OB 20-20-1.25 MG Caps TK ONE C PO QD BETWEEN MEALS   ranitidine 150 MG  tablet Commonly known as:  ZANTAC Take 150 mg by mouth 2 (two) times daily.       Diet: routine diet  Activity: Advance as tolerated. Pelvic rest for 6 weeks.   Outpatient follow up: Follow-up Information    Letitia Libra, MD Follow up in 6 week(s).   Specialty:  Obstetrics and Gynecology Why:  Also, schedule a 2 hour glucose tolerance test at the 6 wk appointment Contact information: [address] [place] [address] [phone]             Postpartum contraception: Tubal Ligation Rhogam Given postpartum: no Rubella vaccine given postpartum: no Varicella vaccine given postpartum: yes TDaP given antepartum or postpartum: yes  Newborn Data: Live born female  Birth Weight:   APGAR: 8, 9   Baby Feeding: Breast  Disposition:home with mother  SIGNED: Thomasene Mohair, MD 09/20/2016 10:34 AM

## 2016-09-18 NOTE — Anesthesia Procedure Notes (Signed)
Epidural Patient location during procedure: OB  Staffing Anesthesiologist: Yevette Edwards Performed: anesthesiologist   Preanesthetic Checklist Completed: patient identified, site marked, surgical consent, pre-op evaluation, timeout performed, IV checked, risks and benefits discussed and monitors and equipment checked  Epidural Patient position: sitting Prep: Betadine Patient monitoring: heart rate, continuous pulse ox and blood pressure Approach: midline Location: L4-L5 Injection technique: LOR saline  Needle:  Needle type: Tuohy  Needle gauge: 17 G Needle length: 9 cm and 9 Needle insertion depth: 6 cm Catheter type: closed end flexible Catheter size: 19 Gauge Catheter at skin depth: 12 cm Test dose: negative and 1.5% lidocaine with Epi 1:200 K  Assessment Sensory level: T10 Events: blood not aspirated, injection not painful, no injection resistance, negative IV test and no paresthesia  Additional Notes Pt. Evaluated and documentation done after procedure finished. Patient identified. Risks/Benefits/Options discussed with patient including but not limited to bleeding, infection, nerve damage, paralysis, failed block, incomplete pain control, headache, blood pressure changes, nausea, vomiting, reactions to medication both or allergic, itching and postpartum back pain. Confirmed with bedside nurse the patient's most recent platelet count. Confirmed with patient that they are not currently taking any anticoagulation, have any bleeding history or any family history of bleeding disorders. Patient expressed understanding and wished to proceed. All questions were answered. Sterile technique was used throughout the entire procedure. Please see nursing notes for vital signs. Test dose was given through epidural catheter and negative prior to continuing to dose epidural or start infusion. Warning signs of high block given to the patient including shortness of breath, tingling/numbness in  hands, complete motor block, or any concerning symptoms with instructions to call for help. Patient was given instructions on fall risk and not to get out of bed. All questions and concerns addressed with instructions to call with any issues or inadequate analgesia.   Patient tolerated the insertion well without immediate complications.Reason for block:procedure for pain

## 2016-09-18 NOTE — Anesthesia Preprocedure Evaluation (Signed)
Anesthesia Evaluation  Patient identified by MRN, date of birth, ID band Patient awake    Reviewed: Allergy & Precautions, H&P , NPO status , Patient's Chart, lab work & pertinent test results, reviewed documented beta blocker date and time   Airway Mallampati: II  TM Distance: >3 FB Neck ROM: full    Dental no notable dental hx. (+) Teeth Intact   Pulmonary neg pulmonary ROS, Current Smoker,    Pulmonary exam normal breath sounds clear to auscultation       Cardiovascular Exercise Tolerance: Good negative cardio ROS   Rhythm:regular Rate:Normal     Neuro/Psych PSYCHIATRIC DISORDERS negative neurological ROS  negative psych ROS   GI/Hepatic negative GI ROS, Neg liver ROS, GERD  Medicated,  Endo/Other  negative endocrine ROSdiabetes  Renal/GU      Musculoskeletal   Abdominal   Peds  Hematology negative hematology ROS (+)   Anesthesia Other Findings   Reproductive/Obstetrics (+) Pregnancy                             Anesthesia Physical Anesthesia Plan  ASA: II  Anesthesia Plan: Epidural   Post-op Pain Management:    Induction:   Airway Management Planned:   Additional Equipment:   Intra-op Plan:   Post-operative Plan:   Informed Consent: I have reviewed the patients History and Physical, chart, labs and discussed the procedure including the risks, benefits and alternatives for the proposed anesthesia with the patient or authorized representative who has indicated his/her understanding and acceptance.     Plan Discussed with:   Anesthesia Plan Comments:         Anesthesia Quick Evaluation

## 2016-09-18 NOTE — OB Triage Note (Signed)
Presents with complaint of contractions and spotting

## 2016-09-19 ENCOUNTER — Encounter
Admission: EM | Disposition: A | Discharge status: Home / Self Care | Payer: Self-pay | Source: Home / Self Care | Attending: Obstetrics & Gynecology

## 2016-09-19 ENCOUNTER — Inpatient Hospital Stay: Payer: Medicaid Other | Admitting: Anesthesiology

## 2016-09-19 ENCOUNTER — Encounter: Payer: Self-pay | Admitting: Obstetrics and Gynecology

## 2016-09-19 DIAGNOSIS — Z302 Encounter for sterilization: Secondary | ICD-10-CM

## 2016-09-19 HISTORY — PX: TUBAL LIGATION: SHX77

## 2016-09-19 LAB — CBC
HEMATOCRIT: 30 % — AB (ref 35.0–47.0)
HEMOGLOBIN: 10.7 g/dL — AB (ref 12.0–16.0)
MCH: 30.5 pg (ref 26.0–34.0)
MCHC: 35.7 g/dL (ref 32.0–36.0)
MCV: 85.4 fL (ref 80.0–100.0)
Platelets: 206 10*3/uL (ref 150–440)
RBC: 3.52 MIL/uL — ABNORMAL LOW (ref 3.80–5.20)
RDW: 13.8 % (ref 11.5–14.5)
WBC: 22.3 10*3/uL — ABNORMAL HIGH (ref 3.6–11.0)

## 2016-09-19 LAB — FETAL SCREEN: Fetal Screen: NEGATIVE

## 2016-09-19 SURGERY — LIGATION, FALLOPIAN TUBE, POSTPARTUM
Anesthesia: General | Laterality: Bilateral

## 2016-09-19 MED ORDER — BENZOCAINE-MENTHOL 20-0.5 % EX AERO: 1.0000 "application " | INHALATION_SPRAY | CUTANEOUS | Status: DC | PRN

## 2016-09-19 MED ORDER — ONDANSETRON HCL 4 MG/2ML IJ SOLN: 4.0000 mg | Freq: Once | INTRAMUSCULAR | Status: DC | PRN

## 2016-09-19 MED ORDER — DEXAMETHASONE SODIUM PHOSPHATE 10 MG/ML IJ SOLN
INTRAMUSCULAR | Status: AC
  Filled 2016-09-19: qty 1

## 2016-09-19 MED ORDER — ACETAMINOPHEN 325 MG PO TABS: 650.0000 mg | ORAL_TABLET | ORAL | Status: DC | PRN

## 2016-09-19 MED ORDER — FENTANYL CITRATE (PF) 100 MCG/2ML IJ SOLN
INTRAMUSCULAR | Status: DC | PRN
  Administered 2016-09-19: 100 ug via INTRAVENOUS

## 2016-09-19 MED ORDER — PANTOPRAZOLE SODIUM 40 MG IV SOLR
40.0000 mg | Freq: Once | INTRAVENOUS | Status: AC
  Administered 2016-09-19: 40 mg via INTRAVENOUS
  Filled 2016-09-19: qty 40

## 2016-09-19 MED ORDER — RHO D IMMUNE GLOBULIN 1500 UNIT/2ML IJ SOSY
300.0000 ug | PREFILLED_SYRINGE | Freq: Once | INTRAMUSCULAR | Status: AC
  Administered 2016-09-19: 300 ug via INTRAVENOUS
  Filled 2016-09-19: qty 2

## 2016-09-19 MED ORDER — SIMETHICONE 80 MG PO CHEW: 80.0000 mg | CHEWABLE_TABLET | ORAL | Status: DC | PRN

## 2016-09-19 MED ORDER — FENTANYL CITRATE (PF) 100 MCG/2ML IJ SOLN
INTRAMUSCULAR | Status: AC
  Filled 2016-09-19: qty 2

## 2016-09-19 MED ORDER — LIDOCAINE HCL (CARDIAC) 20 MG/ML IV SOLN
INTRAVENOUS | Status: DC | PRN
  Administered 2016-09-19: 50 mg via INTRAVENOUS

## 2016-09-19 MED ORDER — SUCCINYLCHOLINE CHLORIDE 20 MG/ML IJ SOLN
INTRAMUSCULAR | Status: AC
  Filled 2016-09-19: qty 1

## 2016-09-19 MED ORDER — LACTATED RINGERS IV SOLN: INTRAVENOUS | Status: DC

## 2016-09-19 MED ORDER — BUPIVACAINE-EPINEPHRINE 0.25% -1:200000 IJ SOLN
INTRAMUSCULAR | Status: DC | PRN
  Administered 2016-09-19: 5 mL

## 2016-09-19 MED ORDER — HYDROMORPHONE HCL 1 MG/ML IJ SOLN: 1.0000 mg | INTRAMUSCULAR | Status: DC | PRN

## 2016-09-19 MED ORDER — OXYCODONE-ACETAMINOPHEN 5-325 MG PO TABS
1.0000 | ORAL_TABLET | ORAL | Status: DC | PRN
  Administered 2016-09-19 (×2): 1 via ORAL
  Filled 2016-09-19 (×2): qty 1

## 2016-09-19 MED ORDER — SENNOSIDES-DOCUSATE SODIUM 8.6-50 MG PO TABS
2.0000 | ORAL_TABLET | ORAL | Status: DC
Start: 2016-09-19 — End: 2016-09-20
  Administered 2016-09-20: 2 via ORAL
  Filled 2016-09-19: qty 2

## 2016-09-19 MED ORDER — OXYTOCIN 40 UNITS IN LACTATED RINGERS INFUSION - SIMPLE MED
2.5000 [IU]/h | INTRAVENOUS | Status: DC | PRN
  Filled 2016-09-19: qty 1000

## 2016-09-19 MED ORDER — MIDAZOLAM HCL 2 MG/2ML IJ SOLN
INTRAMUSCULAR | Status: AC
  Filled 2016-09-19: qty 2

## 2016-09-19 MED ORDER — PROPOFOL 10 MG/ML IV BOLUS
INTRAVENOUS | Status: DC | PRN
  Administered 2016-09-19: 150 mg via INTRAVENOUS

## 2016-09-19 MED ORDER — OXYCODONE-ACETAMINOPHEN 5-325 MG PO TABS
2.0000 | ORAL_TABLET | ORAL | Status: DC | PRN
  Administered 2016-09-19 – 2016-09-20 (×8): 2 via ORAL
  Filled 2016-09-19 (×9): qty 2

## 2016-09-19 MED ORDER — ONDANSETRON HCL 4 MG PO TABS: 4.0000 mg | ORAL_TABLET | ORAL | Status: DC | PRN

## 2016-09-19 MED ORDER — LURASIDONE HCL 20 MG PO TABS
20.0000 mg | ORAL_TABLET | Freq: Every day | ORAL | Status: DC
Start: 2016-09-19 — End: 2016-09-20
  Administered 2016-09-19: 20 mg via ORAL
  Filled 2016-09-19 (×2): qty 1

## 2016-09-19 MED ORDER — PROPOFOL 10 MG/ML IV BOLUS
INTRAVENOUS | Status: AC
  Filled 2016-09-19: qty 20

## 2016-09-19 MED ORDER — BUPIVACAINE-EPINEPHRINE (PF) 0.25% -1:200000 IJ SOLN
INTRAMUSCULAR | Status: AC
  Filled 2016-09-19: qty 30

## 2016-09-19 MED ORDER — SEVOFLURANE IN SOLN
RESPIRATORY_TRACT | Status: AC
  Filled 2016-09-19: qty 250

## 2016-09-19 MED ORDER — SUCCINYLCHOLINE CHLORIDE 20 MG/ML IJ SOLN
INTRAMUSCULAR | Status: DC | PRN
  Administered 2016-09-19: 120 mg via INTRAVENOUS

## 2016-09-19 MED ORDER — MENTHOL 3 MG MT LOZG
1.0000 | LOZENGE | OROMUCOSAL | Status: DC | PRN
  Filled 2016-09-19: qty 9

## 2016-09-19 MED ORDER — ONDANSETRON HCL 4 MG/2ML IJ SOLN
INTRAMUSCULAR | Status: DC | PRN
  Administered 2016-09-19: 4 mg via INTRAVENOUS

## 2016-09-19 MED ORDER — MIDAZOLAM HCL 2 MG/2ML IJ SOLN
INTRAMUSCULAR | Status: DC | PRN
  Administered 2016-09-19: 1 mg via INTRAVENOUS

## 2016-09-19 MED ORDER — WITCH HAZEL-GLYCERIN EX PADS: 1.0000 "application " | MEDICATED_PAD | CUTANEOUS | Status: DC | PRN

## 2016-09-19 MED ORDER — FENTANYL CITRATE (PF) 100 MCG/2ML IJ SOLN
25.0000 ug | INTRAMUSCULAR | Status: AC | PRN
  Administered 2016-09-19 (×6): 25 ug via INTRAVENOUS

## 2016-09-19 MED ORDER — LIDOCAINE HCL (PF) 2 % IJ SOLN
INTRAMUSCULAR | Status: AC
  Filled 2016-09-19: qty 2

## 2016-09-19 MED ORDER — DEXAMETHASONE SODIUM PHOSPHATE 10 MG/ML IJ SOLN
INTRAMUSCULAR | Status: DC | PRN
  Administered 2016-09-19: 10 mg via INTRAVENOUS

## 2016-09-19 MED ORDER — DIPHENHYDRAMINE HCL 25 MG PO CAPS: 25.0000 mg | ORAL_CAPSULE | Freq: Four times a day (QID) | ORAL | Status: DC | PRN

## 2016-09-19 MED ORDER — ONDANSETRON HCL 4 MG/2ML IJ SOLN: 4.0000 mg | INTRAMUSCULAR | Status: DC | PRN

## 2016-09-19 MED ORDER — ONDANSETRON HCL 4 MG/2ML IJ SOLN
INTRAMUSCULAR | Status: AC
  Filled 2016-09-19: qty 2

## 2016-09-19 MED ORDER — KETOROLAC TROMETHAMINE 30 MG/ML IJ SOLN
INTRAMUSCULAR | Status: DC | PRN
  Administered 2016-09-19: 30 mg via INTRAVENOUS

## 2016-09-19 MED ORDER — DIBUCAINE 1 % RE OINT: 1.0000 "application " | TOPICAL_OINTMENT | RECTAL | Status: DC | PRN

## 2016-09-19 MED ORDER — LACTATED RINGERS IV SOLN
INTRAVENOUS | Status: DC
  Administered 2016-09-19: 09:00:00 via INTRAVENOUS

## 2016-09-19 MED ORDER — COCONUT OIL OIL: 1.0000 "application " | TOPICAL_OIL | Status: DC | PRN

## 2016-09-19 MED ORDER — ZOLPIDEM TARTRATE 5 MG PO TABS: 5.0000 mg | ORAL_TABLET | Freq: Every evening | ORAL | Status: DC | PRN

## 2016-09-19 SURGICAL SUPPLY — 27 items
BLADE SURG SZ11 CARB STEEL (BLADE) ×2 IMPLANT
CHLORAPREP W/TINT 26ML (MISCELLANEOUS) ×2 IMPLANT
DERMABOND ADVANCED (GAUZE/BANDAGES/DRESSINGS) ×1
DERMABOND ADVANCED .7 DNX12 (GAUZE/BANDAGES/DRESSINGS) ×1 IMPLANT
DRAPE LAPAROTOMY 77X122 PED (DRAPES) ×2 IMPLANT
ELECT CAUTERY BLADE 6.4 (BLADE) ×2 IMPLANT
ELECT REM PT RETURN 9FT ADLT (ELECTROSURGICAL) ×2
ELECTRODE REM PT RTRN 9FT ADLT (ELECTROSURGICAL) ×1 IMPLANT
GLOVE BIO SURGEON STRL SZ7 (GLOVE) ×6 IMPLANT
GLOVE BIOGEL PI IND STRL 7.5 (GLOVE) ×1 IMPLANT
GLOVE BIOGEL PI INDICATOR 7.5 (GLOVE) ×1
GOWN STRL REUS W/ TWL LRG LVL3 (GOWN DISPOSABLE) ×2 IMPLANT
GOWN STRL REUS W/ TWL XL LVL3 (GOWN DISPOSABLE) IMPLANT
GOWN STRL REUS W/TWL LRG LVL3 (GOWN DISPOSABLE) ×2
GOWN STRL REUS W/TWL XL LVL3 (GOWN DISPOSABLE)
KIT RM TURNOVER CYSTO AR (KITS) ×2 IMPLANT
LABEL OR SOLS (LABEL) ×2 IMPLANT
LIQUID BAND (GAUZE/BANDAGES/DRESSINGS) ×2 IMPLANT
NDL SAFETY 22GX1.5 (NEEDLE) ×2 IMPLANT
NS IRRIG 500ML POUR BTL (IV SOLUTION) ×2 IMPLANT
PACK BASIN MINOR ARMC (MISCELLANEOUS) ×2 IMPLANT
SUT MNCRL 4-0 (SUTURE) ×1
SUT MNCRL 4-0 27XMFL (SUTURE) ×1
SUT PLAIN GUT 0 (SUTURE) ×4 IMPLANT
SUT VIC AB 2-0 UR6 27 (SUTURE) ×2 IMPLANT
SUTURE MNCRL 4-0 27XMF (SUTURE) ×1 IMPLANT
SYRINGE 10CC LL (SYRINGE) ×2 IMPLANT

## 2016-09-19 NOTE — Progress Notes (Signed)
[name], female   DOB: 1991/02/14, 26 y.o.   MRN: [id number]  Obstetric Postpartum Daily Progress Note Subjective:  26 y.o. [id number] postpartum day #1 status post vaginal delivery.  She is ambulating, is tolerating po(but has been NPO since midnight), is voiding spontaneously.  Her pain is well controlled on PO pain medications. Her lochia is less than menses.   Medications SCHEDULED MEDICATIONS  . senna-docusate  2 tablet Oral Q24H    MEDICATION INFUSIONS  . lactated ringers 125 mL/hr at 09/19/16 0845  . oxytocin 2.5 Units/hr (09/19/16 0350)    PRN MEDICATIONS  acetaminophen, benzocaine-Menthol, coconut oil, witch hazel-glycerin **AND** dibucaine, diphenhydrAMINE, ondansetron **OR** ondansetron (ZOFRAN) IV, oxyCODONE-acetaminophen, oxyCODONE-acetaminophen, oxytocin, simethicone, zolpidem    Objective:   Vitals:   09/19/16 0212 09/19/16 0227 09/19/16 0401 09/19/16 0736  BP: 125/63 123/64 109/68 108/64  Pulse: (!) 102 100 (!) 101 89  Resp:   18 18  Temp: 98.4 F (36.9 C)  98.1 F (36.7 C) 98.4 F (36.9 C)  TempSrc: Oral  Oral Oral  SpO2:    100%  Weight:      Height:        Current Vital Signs 24h Vital Sign Ranges  T 98.4 F (36.9 C) Temp  Avg: 98.1 F (36.7 C)  Min: 97.8 F (36.6 C)  Max: 98.4 F (36.9 C)  BP 108/64 BP  Min: 108/64  Max: 133/61  HR 89 Pulse  Avg: 109.9  Min: 89  Max: 150  RR 18 Resp  Avg: 17  Min: 14  Max: 18  SaO2 100 % Not Delivered SpO2  Avg: 100 %  Min: 100 %  Max: 100 %       24 Hour I/O Current Shift I/O  Time Ins Outs 03/30 0701 - 03/31 0700 In: -  Out: 1690 [Urine:450] No intake/output data recorded.  General: NAD Pulmonary: no increased work of breathing CV: tachycardia, rhythm regular Abdomen: non-distended, non-tender, fundus firm at level of umbilicus Extremities: no edema, no erythema, no tenderness  Labs:   Recent Labs Lab 09/18/16 1930 09/19/16 0515  WBC 23.6* 22.3*  HGB 12.9 10.7*  HCT 36.2 30.0*  PLT  259 206     Assessment:   26 y.o. G3P2002 postpartum day # 1 status post SVD, desires permanent sterilization.  Plan:   1) Acute blood loss anemia - hemodynamically stable and asymptomatic - po ferrous sulfate  2) A NEG / Rubella Immune (11/02 0000)/ Varicella Not immune - varivax prior to discharge and at 6 wks  3) TDAP status up-to-date  4) breast /Contraception = plans BTL today.    Other reversible forms of contraception were discussed with patient; she declines all other modalities. Permanent nature of as well as associated risks of the procedure discussed with patient including but not limited to: risk of regret (emphasized for 26), permanence of method, bleeding, infection, injury to surrounding organs and need for additional procedures.  Failure risk of 0.5-1% with increased risk of ectopic gestation if pregnancy occurs was also discussed with patient.   To OR this AM for BTL.   5) Disposition: home PPD#1 or 2  Thomasene Mohair, MD 09/19/2016 8:51 AM

## 2016-09-19 NOTE — Op Note (Signed)
  Operative Note    Pre-Op Diagnosis: multiparity, desires permanent sterilization  Post-Op Diagnosis: multiparity, desires permanent sterilization  Procedures:  Postpartum Bilateral tubal ligation via Pomeroy method  Primary Surgeon: Thomasene Mohair, MD   EBL: 25 mL   IVF: 500 mL   Specimens: portion of right and left fallopian tube  Drains: none  Complications: None   Disposition: PACU   Condition: Stable   Findings: normal-appearing bilateral fallopian tubes  Procedure Summary:  The patient was taken to the operating room where general anesthesia is administered and found to be adequate. After timeout was called a small transverse, infraumbilical incision was made with the scalpel. The incision was carried down through the fascia until the peritoneum was identified and entered. The peritoneum was noted to be free of any adhesions and the incision was then extended.  The patient's left fallopian tube was identified, brought incision, and grasped with a Babcock clamp. The tube was then followed out to the fimbria. The Babcock clamp was then used to grasp the tube approximately 4 cm from the cornual region. A 3 cm segment of tube was then ligated with the 2 free ties of plain gut, and excised. Good hemostasis was noted and the tube was returned to the abdomen. The right fallopian tube was then ligated, and a 3 cm segment excised in a similar fashion. Excellent hemostasis was noted, and the tube returned to the abdomen.  The peritoneum and fascia were closed in a single layer using 3-0 Vicryl. The skin was closed in a subcuticular fashion using 3-0 vicryl, undyed. The closure was also closed with Dermabond.  The patient tolerated the procedure well.  Sponge, lap, needle, and instrument counts were correct x 2.  VTE prophylaxis: SCDs. Antibiotic prophylaxis: none indicated nor given. She was awakened in the operating room and was taken to the PACU in stable condition.   Thomasene Mohair, MD 09/19/2016 9:57 AM

## 2016-09-19 NOTE — Anesthesia Post-op Follow-up Note (Cosign Needed)
Anesthesia QCDR form completed.        

## 2016-09-19 NOTE — Transfer of Care (Signed)
Immediate Anesthesia Transfer of Care Note  Patient: Alyssa Kemp  Procedure(s) Performed: Procedure(s): POST PARTUM TUBAL LIGATION (Bilateral)  Patient Location: PACU  Anesthesia Type:General  Level of Consciousness: sedated  Airway & Oxygen Therapy: Patient Spontanous Breathing and Patient connected to face mask oxygen  Post-op Assessment: Report given to RN and Post -op Vital signs reviewed and stable  Post vital signs: Reviewed and stable  Last Vitals:  Vitals:   09/19/16 0401 09/19/16 0736  BP: 109/68 108/64  Pulse: (!) 101 89  Resp: 18 18  Temp: 36.7 C 36.9 C    Last Pain:  Vitals:   09/19/16 0755  TempSrc:   PainSc: 6          Complications: No apparent anesthesia complications

## 2016-09-19 NOTE — Anesthesia Preprocedure Evaluation (Signed)
Anesthesia Evaluation  Patient identified by MRN, date of birth, ID band Patient awake    Reviewed: Allergy & Precautions, H&P , NPO status , Patient's Chart, lab work & pertinent test results, reviewed documented beta blocker date and time   Airway Mallampati: II   Neck ROM: full    Dental  (+) Poor Dentition   Pulmonary neg pulmonary ROS,    Pulmonary exam normal        Cardiovascular negative cardio ROS Normal cardiovascular exam Rhythm:regular Rate:Normal     Neuro/Psych PSYCHIATRIC DISORDERS negative neurological ROS  negative psych ROS   GI/Hepatic negative GI ROS, Neg liver ROS, GERD  Medicated,  Endo/Other  negative endocrine ROSdiabetes  Renal/GU negative Renal ROS  negative genitourinary   Musculoskeletal   Abdominal   Peds  Hematology negative hematology ROS (+)   Anesthesia Other Findings Past Medical History: No date: Bipolar depression (HCC) No date: GERD (gastroesophageal reflux disease) No date: Gestational diabetes No date: History of macrosomia in infant in prior pregn* 12/16/2015: LGSIL on Pap smear of cervix No date: Scoliosis Past Surgical History: No date: ADENOIDECTOMY 01/15/2016: COLPOSCOPY     Comment: CIN 1 No date: TONSILLECTOMY No date: WISDOM TOOTH EXTRACTION BMI    Body Mass Index:  33.64 kg/m     Reproductive/Obstetrics negative OB ROS                             Anesthesia Physical Anesthesia Plan  ASA: II  Anesthesia Plan: General   Post-op Pain Management:    Induction:   Airway Management Planned:   Additional Equipment:   Intra-op Plan:   Post-operative Plan:   Informed Consent: I have reviewed the patients History and Physical, chart, labs and discussed the procedure including the risks, benefits and alternatives for the proposed anesthesia with the patient or authorized representative who has indicated his/her understanding and  acceptance.   Dental Advisory Given  Plan Discussed with: CRNA  Anesthesia Plan Comments:         Anesthesia Quick Evaluation

## 2016-09-19 NOTE — Anesthesia Procedure Notes (Signed)
Procedure Name: Intubation Date/Time: 09/19/2016 9:16 AM Performed by: Ginger Carne Pre-anesthesia Checklist: Patient identified, Emergency Drugs available, Suction available, Patient being monitored and Timeout performed Patient Re-evaluated:Patient Re-evaluated prior to inductionOxygen Delivery Method: Circle system utilized Preoxygenation: Pre-oxygenation with 100% oxygen Intubation Type: IV induction, Rapid sequence and Cricoid Pressure applied Laryngoscope Size: 2 and Miller Grade View: Grade I Tube type: Oral Tube size: 7.0 mm Number of attempts: 1 Airway Equipment and Method: Stylet Placement Confirmation: ETT inserted through vocal cords under direct vision,  positive ETCO2 and breath sounds checked- equal and bilateral Secured at: 22 cm Tube secured with: Tape Dental Injury: Teeth and Oropharynx as per pre-operative assessment

## 2016-09-20 LAB — RHOGAM INJECTION: UNIT DIVISION: 0

## 2016-09-20 LAB — GLUCOSE, CAPILLARY: Glucose-Capillary: 79 mg/dL (ref 65–99)

## 2016-09-20 LAB — RPR: RPR Ser Ql: NONREACTIVE

## 2016-09-20 MED ORDER — OXYCODONE-ACETAMINOPHEN 5-325 MG PO TABS: 1.0000 | ORAL_TABLET | ORAL | 0 refills | Status: DC | PRN

## 2016-09-20 MED ORDER — IBUPROFEN 600 MG PO TABS: 600.0000 mg | ORAL_TABLET | Freq: Four times a day (QID) | ORAL | 0 refills | Status: DC | PRN

## 2016-09-20 NOTE — Anesthesia Postprocedure Evaluation (Signed)
Anesthesia Post Note  Patient: Alyssa Kemp  Procedure(s) Performed: * No procedures listed *  Anesthesia Type: Epidural     Last Vitals:  Vitals:   09/20/16 0412 09/20/16 0720  BP: (!) 110/54 108/61  Pulse: 92 85  Resp: 18 17  Temp: 36.5 C 36.9 C    Last Pain:  Vitals:   09/20/16 0845  TempSrc:   PainSc: 6                  Zachary George

## 2016-09-20 NOTE — Progress Notes (Signed)
Discharge instructions complete and prescriptions given. Patient verbalizes understanding of teaching. Patient discharged home at 1430. 

## 2016-09-20 NOTE — Anesthesia Postprocedure Evaluation (Signed)
Anesthesia Post Note  Patient: Alyssa Kemp  Procedure(s) Performed: * No procedures listed *  Patient location during evaluation: Mother Baby Anesthesia Type: Epidural Level of consciousness: awake, awake and alert and oriented Pain management: pain level controlled Vital Signs Assessment: post-procedure vital signs reviewed and stable Respiratory status: spontaneous breathing and nonlabored ventilation Cardiovascular status: stable Postop Assessment: no headache, no backache, adequate PO intake and patient able to bend at knees Anesthetic complications: no     Last Vitals:  Vitals:   09/20/16 0412 09/20/16 0720  BP: (!) 110/54 108/61  Pulse: 92 85  Resp: 18 17  Temp: 36.5 C 36.9 C    Last Pain:  Vitals:   09/20/16 0845  TempSrc:   PainSc: 6                  Zachary George

## 2016-09-21 LAB — BPAM RBC
BLOOD PRODUCT EXPIRATION DATE: 201804222359
BLOOD PRODUCT EXPIRATION DATE: 201804222359
Unit Type and Rh: 600
Unit Type and Rh: 600

## 2016-09-21 LAB — TYPE AND SCREEN
ABO/RH(D): A NEG
Antibody Screen: POSITIVE
Unit division: 0
Unit division: 0

## 2016-09-22 ENCOUNTER — Encounter: Payer: Medicaid Other | Admitting: Advanced Practice Midwife

## 2016-09-22 ENCOUNTER — Other Ambulatory Visit: Payer: Medicaid Other

## 2016-09-22 LAB — SURGICAL PATHOLOGY

## 2016-09-23 ENCOUNTER — Other Ambulatory Visit (HOSPITAL_COMMUNITY): Payer: Self-pay | Admitting: Obstetrics and Gynecology

## 2016-09-24 NOTE — Anesthesia Postprocedure Evaluation (Signed)
Anesthesia Post Note  Patient: Alyssa Kemp  Procedure(s) Performed: Procedure(s) (LRB): POST PARTUM TUBAL LIGATION (Bilateral)  Patient location during evaluation: PACU Anesthesia Type: General Level of consciousness: awake and alert Pain management: pain level controlled Vital Signs Assessment: post-procedure vital signs reviewed and stable Respiratory status: spontaneous breathing, nonlabored ventilation, respiratory function stable and patient connected to nasal cannula oxygen Cardiovascular status: blood pressure returned to baseline and stable Postop Assessment: no signs of nausea or vomiting Anesthetic complications: no     Last Vitals:  Vitals:   09/20/16 0720 09/20/16 1300  BP: 108/61   Pulse: 85   Resp: 17   Temp: 36.9 C 36.6 C    Last Pain:  Vitals:   09/20/16 1300  TempSrc: Oral  PainSc: 7                  Yevette Edwards

## 2016-09-25 ENCOUNTER — Encounter: Payer: Self-pay | Admitting: Obstetrics and Gynecology

## 2016-09-25 ENCOUNTER — Encounter: Payer: Medicaid Other | Admitting: Obstetrics and Gynecology

## 2016-09-25 ENCOUNTER — Ambulatory Visit (INDEPENDENT_AMBULATORY_CARE_PROVIDER_SITE_OTHER): Payer: Medicaid Other | Admitting: Obstetrics and Gynecology

## 2016-09-25 VITALS — BP 120/70 | HR 80 | Wt 182.0 lb

## 2016-09-25 DIAGNOSIS — Z09 Encounter for follow-up examination after completed treatment for conditions other than malignant neoplasm: Secondary | ICD-10-CM

## 2016-09-25 DIAGNOSIS — N3001 Acute cystitis with hematuria: Secondary | ICD-10-CM

## 2016-09-25 LAB — POCT URINALYSIS DIPSTICK
BILIRUBIN UA: NEGATIVE
Blood, UA: POSITIVE
Glucose, UA: NEGATIVE
KETONES UA: NEGATIVE
Nitrite, UA: NEGATIVE
PH UA: 5 (ref 5.0–8.0)
Protein, UA: NEGATIVE
Spec Grav, UA: 1.003 (ref 1.030–1.035)
Urobilinogen, UA: 0.2 (ref ?–2.0)

## 2016-09-25 MED ORDER — NITROFURANTOIN MONOHYD MACRO 100 MG PO CAPS: 100.0000 mg | ORAL_CAPSULE | Freq: Two times a day (BID) | ORAL | 1 refills | Status: DC

## 2016-09-25 NOTE — Addendum Note (Signed)
Addended by: Cornelius Moras D on: 09/25/2016 01:44 PM   Modules accepted: Orders

## 2016-09-25 NOTE — Patient Instructions (Signed)

## 2016-09-25 NOTE — Progress Notes (Signed)
   Postoperative Follow-up Patient presents post op from a postpartum bilateral tubal ligation 7days ago for desire for permanent sterility.  Subjective: Patient reports bilateral lower quadrant pain, sharp, stabbing and severe. No alleviating nor aggravating factors. Eating a regular diet without difficulty. The patient is not having any pain.  Activity: normal activities of daily living.  Denies fevers, chills, nausea, emesis, issues with incision.   Objective: Vitals:   09/25/16 1300  BP: 120/70  Pulse: 80   Vital Signs: BP 120/70   Pulse 80   Wt 182 lb (82.6 kg)   BMI 31.24 kg/m  Constitutional: Well nourished, well developed female in no acute distress.  HEENT: normal Skin: Warm and dry.  Extremity: edema  Abdomen: soft, mildly tender to palpation in the suprapubic area. No fundal tenderness. +BS. clean, dry, intact and without erythema, induration, warmth, and tenderness, mild resolving ecchymosis   UA: LE: mod Blood: mod All else negative  Assessment: 26 y.o. s/p postpartum BTL with acute cystitis.   Plan: Postop check - Doing well.  Wound care instructions given.  Acute cystitis with hematuria - - urine culture and sensitivities- treat with macrobid today  Patient has done well after surgery, but does have a UTI.  I have discussed the post-operative course to date, and the expected progress moving forward.  The patient understands what complications to be concerned about.  I will see the patient in routine follow up, or sooner if needed.    Activity plan: No heavy lifting.  Thomasene Mohair, MD 09/25/2016, 1:23 PM

## 2016-09-27 LAB — URINE CULTURE

## 2016-10-01 ENCOUNTER — Telehealth: Payer: Self-pay

## 2016-10-01 NOTE — Telephone Encounter (Signed)
Pt calling.  She took last dose of macrobid today and is still having pain.  I adv pt macrobid would stay in her system a few more days and I would forward msg to SDJ/nurse.

## 2016-11-04 ENCOUNTER — Ambulatory Visit (INDEPENDENT_AMBULATORY_CARE_PROVIDER_SITE_OTHER): Payer: Medicaid Other | Admitting: Obstetrics & Gynecology

## 2016-11-04 ENCOUNTER — Encounter: Payer: Self-pay | Admitting: Obstetrics & Gynecology

## 2016-11-04 DIAGNOSIS — O24419 Gestational diabetes mellitus in pregnancy, unspecified control: Secondary | ICD-10-CM

## 2016-11-04 DIAGNOSIS — O24919 Unspecified diabetes mellitus in pregnancy, unspecified trimester: Secondary | ICD-10-CM

## 2016-11-04 NOTE — Progress Notes (Signed)
  OBSTETRICS POSTPARTUM CLINIC PROGRESS NOTE  Subjective:     Alyssa Kemp is a 26 y.o. [id number] female who presents for a postpartum visit. She is 6 weeks postpartum following a Term pregnancy and delivery by Vaginal, no problems at delivery.  I have fully reviewed the prenatal and intrapartum course. Anesthesia: epidural.  Postpartum course has been complicated by uncomplicated.  Baby is feeding by breast.  Bleeding: patient has not  resumed menses.  Bowel function is normal. Bladder function is normal.  Patient is sexually active. Contraception method desired is tubal ligation.  Postpartum depression screening: negative. Edinburgh 7.  The following portions of the patient's history were reviewed and updated as appropriate: allergies, current medications, past family history, past medical history, past social history, past surgical history and problem list.  Review of Systems Pertinent items are noted in HPI. Pertinent items noted in HPI and remainder of comprehensive ROS otherwise negative.  Objective:    BP 120/80   Pulse 74   Ht 5\' 4"  (1.626 m)   Wt 183 lb (83 kg)   BMI 31.41 kg/m   General:  alert and no distress   Breasts:  inspection negative, no nipple discharge or bleeding, no masses or nodularity palpable  Lungs: clear to auscultation bilaterally  Heart:  regular rate and rhythm, S1, S2 normal, no murmur, click, rub or gallop  Abdomen: soft, non-tender; bowel sounds normal; no masses,  no organomegaly.     Vulva:  normal  Vagina: normal vagina, no discharge, exudate, lesion, or erythema  Cervix:  no cervical motion tenderness and no lesions  Corpus: normal size, contour, position, consistency, mobility, non-tender  Adnexa:  normal adnexa and no mass, fullness, tenderness  Rectal Exam: Not performed.          Assessment:     6 week Normal postpartum exam.   History of cervical dysplasia  Plan:     See orders and Patient Instructions Resume all normal  activities Follow up in: 6 months or as needed.  GTT PAP due to prior LGSIL  Annamarie MajorPaul Dorthy Hustead, MD, Merlinda FrederickFACOG [facility] 11/04/2016  10:33 AM

## 2016-11-09 LAB — IGP, RFX APTIMA HPV ASCU: PAP Smear Comment: 0

## 2016-11-10 ENCOUNTER — Telehealth: Payer: Self-pay | Admitting: Obstetrics & Gynecology

## 2016-11-10 NOTE — Progress Notes (Signed)
Sch Colpo due to persistant mildly abnormal PAP.  Consider this Thurs afternoon where sch was reopened, or another time w PH.

## 2016-11-10 NOTE — Telephone Encounter (Signed)
-----   Message from Nadara Mustardobert P Harris, MD sent at 11/10/2016  8:14 AM EDT ----- Sch Colpo due to persistant mildly abnormal PAP.  Consider this Thurs afternoon where sch was reopened, or another time w PH.

## 2016-11-10 NOTE — Telephone Encounter (Signed)
Alyssa Kemp, Alyssa P, MD  Alyssa Kemp, Alyssa Kemp        Sch Colpo due to persistant mildly abnormal PAP. Consider this Thurs afternoon where sch was reopened, or another time w PH.    Pt is schedule is schedule 12/01/16 with Tiburcio PeaHarris

## 2016-11-12 ENCOUNTER — Other Ambulatory Visit: Payer: Medicaid Other

## 2016-12-01 ENCOUNTER — Ambulatory Visit: Payer: Medicaid Other | Admitting: Obstetrics & Gynecology

## 2017-01-04 ENCOUNTER — Ambulatory Visit: Payer: Medicaid Other | Admitting: Obstetrics & Gynecology

## 2017-01-06 ENCOUNTER — Emergency Department
Admission: EM | Admit: 2017-01-06 | Discharge: 2017-01-06 | Disposition: A | Discharge status: Home / Self Care | Payer: Medicaid Other | Attending: Emergency Medicine | Admitting: Emergency Medicine

## 2017-01-06 DIAGNOSIS — B9689 Other specified bacterial agents as the cause of diseases classified elsewhere: Secondary | ICD-10-CM

## 2017-01-06 DIAGNOSIS — N760 Acute vaginitis: Secondary | ICD-10-CM | POA: Diagnosis not present

## 2017-01-06 DIAGNOSIS — Z79899 Other long term (current) drug therapy: Secondary | ICD-10-CM | POA: Insufficient documentation

## 2017-01-06 DIAGNOSIS — F419 Anxiety disorder, unspecified: Secondary | ICD-10-CM | POA: Diagnosis present

## 2017-01-06 LAB — URINALYSIS, COMPLETE (UACMP) WITH MICROSCOPIC
Bilirubin Urine: NEGATIVE
Glucose, UA: NEGATIVE mg/dL
KETONES UR: NEGATIVE mg/dL
LEUKOCYTES UA: NEGATIVE
NITRITE: NEGATIVE
PROTEIN: NEGATIVE mg/dL
Specific Gravity, Urine: 1.011 (ref 1.005–1.030)
pH: 5 (ref 5.0–8.0)

## 2017-01-06 LAB — WET PREP, GENITAL
Sperm: NONE SEEN
TRICH WET PREP: NONE SEEN
YEAST WET PREP: NONE SEEN

## 2017-01-06 LAB — CHLAMYDIA/NGC RT PCR (ARMC ONLY)
Chlamydia Tr: DETECTED — AB
N GONORRHOEAE: NOT DETECTED

## 2017-01-06 MED ORDER — CLINDAMYCIN HCL 300 MG PO CAPS: 300.0000 mg | ORAL_CAPSULE | Freq: Three times a day (TID) | ORAL | 0 refills | Status: AC

## 2017-01-06 NOTE — ED Notes (Signed)
Pt presents to ED with c/o being sent by her therapist for evaluation of her anxiety and depression due to patient not being able to be seen by her psychiatrist for a few weeks. Pt states she is taking Latuda and Vyvanse for her anxiety. Pt denies any SI/HI at this time. Pt is also noted to be holding her baby at this time. Pt also states that she is having vaginal pain, odor, and discharge since she had her tubes tied and would like evaluation for an STD. Pt is alert and oriented at this time. NAD noted.

## 2017-01-06 NOTE — ED Provider Notes (Signed)
[facility] Emergency Department Provider Note  ____________________________________________   None    (approximate)  I have reviewed the triage vital signs and the nursing notes.   HISTORY  Chief Complaint Anxiety and Medication Refill   HPI Alyssa Kemp is a 26 y.o. female who presents to the emergency department for evaluation of anxiety and STD check. She lives at [facility] and has been advised by the therapist to come to the emergency department for treatment of anxiety. She states that it has been "paralyzing." She denies SI or HI. She denies thoughts of harming her 80 month old baby or postpartum depression. She has had unprotected sex 2 separate times over the past couple of weeks and is now having an odor and increase in white discharge. She denies abdominal pain or vomiting.   Past Medical History:  Diagnosis Date  . Bipolar depression (HCC)   . GERD (gastroesophageal reflux disease)   . Gestational diabetes   . History of macrosomia in infant in prior pregnancy, currently pregnant   . 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 Scoliosis     Patient Active Problem List   Diagnosis Date Noted  . Encounter for sterilization 09/19/2016  . Normal labor and delivery 09/18/2016  . Labor and delivery indication for care or intervention 09/14/2016  . Gestational diabetes mellitus (GDM) affecting pregnancy, antepartum 09/05/2016  . High risk pregnancy, antepartum 09/05/2016  . Bipolar 1 disorder (HCC) 09/05/2016  . Rh negative state in antepartum period 09/05/2016  . GERD (gastroesophageal reflux disease) 08/28/2016  . 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 Anxiety, generalized 11/05/2015  . 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/05/2015  . History of chlamydia infection 11/05/2015  . History of abnormal Pap smear 02/21/2013    Past Surgical History:  Procedure Laterality Date  . ADENOIDECTOMY    .  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 TONSILLECTOMY    . TUBAL LIGATION Bilateral 09/19/2016   Procedure: POST PARTUM TUBAL LIGATION;  Surgeon: [name], MD;  Location: [facility] ORS;  Service: Gynecology;  Laterality: Bilateral;  . WISDOM TOOTH EXTRACTION      Prior to Admission medications   Medication Sig Start Date End Date Taking? Authorizing Provider  clindamycin (CLEOCIN) 300 MG capsule Take 1 capsule (300 mg total) by mouth 3 (three) times daily. 01/06/17 01/16/17  Joshus Rogan, Kasandra Knudsen, FNP  ibuprofen (ADVIL,MOTRIN) 600 MG tablet Take 1 tablet (600 mg total) by mouth every 6 (six) hours as needed for mild pain or cramping. 09/20/16   [name], MD  lurasidone (LATUDA) 20 MG TABS tablet Take 20 mg by mouth at bedtime.    [provider]  nitrofurantoin, macrocrystal-monohydrate, (MACROBID) 100 MG capsule Take 1 capsule (100 mg total) by mouth 2 (two) times daily. 09/25/16   [name], MD  oxyCODONE-acetaminophen (PERCOCET/ROXICET) 5-325 MG tablet Take 1 tablet by mouth every 4 (four) hours as needed (pain scale 4-7). 09/20/16   [name], MD  Prenat w/o A Vit-FeFum-FePo-FA (PROVIDA OB) 20-20-1.25 MG CAPS TK ONE C PO QD BETWEEN MEALS 08/14/16   [provider]  ranitidine (ZANTAC) 150 MG tablet Take 150 mg by mouth 2 (two) times daily.  10/17/15   [provider]    Allergies Sulfa antibiotics; Sulfacetamide sodium; Doxycycline; Cantaloupe (diagnostic); Amoxicillin-pot clavulanate; and Penicillins  Family History  Problem Relation Age of Onset  . Cervical cancer Mother   . Ovarian  cancer Mother   . Hypertension Maternal Grandmother   . Pancreatic cancer Maternal Grandmother   . Diabetes Maternal Grandmother   . Coronary artery disease Maternal Grandfather   . Congestive Heart Failure Maternal Grandfather   . Hypertension Maternal Grandfather   . Prostate cancer Maternal Grandfather   . Diabetes Maternal Grandfather   . Hypertension  Paternal Grandmother   . Diabetes Paternal Grandmother   . Heart disease Paternal Grandfather   . Hypertension Paternal Grandfather   . Breast cancer Paternal Aunt     Social History Social History  Substance Use Topics  . Smoking status: Never Smoker  . Smokeless tobacco: Never Used  . Alcohol use No    Review of Systems  Constitutional: No fever/chills Eyes: No visual changes. ENT: No sore throat. Cardiovascular: Denies chest pain. Respiratory: Denies shortness of breath. Gastrointestinal: No abdominal pain.  No nausea, no vomiting.  No diarrhea. Genitourinary: Negative for dysuria. Positive for vaginal discharge and odor. Musculoskeletal: Negative for back pain. Skin: Negative for rash. Neurological: Negative for headaches, focal weakness or numbness. Psychiatric:Positive for anxiety. ____________________________________________   PHYSICAL EXAM:  VITAL SIGNS: ED Triage Vitals [01/06/17 1440]  Enc Vitals Group     BP 111/62     Pulse Rate 83     Resp 18     Temp 98.2 F (36.8 C)     Temp Source Oral     SpO2 99 %     Weight 180 lb (81.6 kg)     Height 5\' 4"  (1.626 m)     Head Circumference      Peak Flow      Pain Score 0     Pain Loc      Pain Edu?      Excl. in GC?     Constitutional: Alert and oriented. Well appearing and in no acute distress. Eyes: Conjunctivae are normal. Head: Atraumatic. Nose: No congestion/rhinnorhea. Mouth/Throat: Mucous membranes are moist.  Oropharynx non-erythematous. Neck: No stridor.   Cardiovascular: Normal rate, regular rhythm. Grossly normal heart sounds.  Good peripheral circulation. Respiratory: Normal respiratory effort.  No retractions. Lungs CTAB. Gastrointestinal: Soft and nontender. No distention. No abdominal bruits. No CVA tenderness. Genitourinary: Large amount of white vaginal discharge in the vaginal vault. Petechiae noted at the 12 o'clock position of the cervix. No active bleeding. Cervical os is closed.    Musculoskeletal: No lower extremity tenderness nor edema.  No joint effusions. Neurologic:  Normal speech and language. No gross focal neurologic deficits are appreciated. No gait instability. Skin:  Skin is warm, dry and intact. No rash noted. Psychiatric: Mood and affect are normal. Speech and behavior are normal. Smiling and interactive with her baby who appears well cared for.  ____________________________________________   LABS (all labs ordered are listed, but only abnormal results are displayed)  Labs Reviewed  WET PREP, GENITAL - Abnormal; Notable for the following:       Result Value   Clue Cells Wet Prep HPF POC PRESENT (*)    WBC, Wet Prep HPF POC FEW (*)    All other components within normal limits  CHLAMYDIA/NGC RT PCR ([facility] ONLY) - Abnormal; Notable for the following:    Chlamydia Tr DETECTED (*)    All other components within normal limits  URINALYSIS, COMPLETE (UACMP) WITH MICROSCOPIC - Abnormal; Notable for the following:    Color, Urine YELLOW (*)    APPearance HAZY (*)    Hgb urine dipstick SMALL (*)    Bacteria, UA  RARE (*)    Squamous Epithelial / LPF 0-5 (*)    All other components within normal limits   ____________________________________________  EKG  Not indicated. ____________________________________________  RADIOLOGY  No results found.  ____________________________________________   PROCEDURES  Procedure(s) performed: None  Procedures  Critical Care performed: No  ____________________________________________   INITIAL IMPRESSION / ASSESSMENT AND PLAN / ED COURSE  Pertinent labs & imaging results that were available during my care of the patient were reviewed by me and considered in my medical decision making (see chart for details).  26 year old female presenting for evaluation of vaginal discharge and anxiety. She is currently breastfeeding. She will be advised to take Benadryl for her anxiety and was given a  prescription for Clindamycin for the known BV. She was advised to monitor the baby for diarrhea and have her see the pediatrician if symptoms occur. She was instructed to follow up with the health department for further management of STDs and the psychiatrist for anxiety treatment. She was advised to return to the ER for symptoms that change or worsen or for new concerns if unable to schedule an appointment.      ____________________________________________   FINAL CLINICAL IMPRESSION(S) / ED DIAGNOSES  Final diagnoses:  Anxiety  Bacterial vaginitis      NEW MEDICATIONS STARTED DURING THIS VISIT:  Discharge Medication List as of 01/06/2017  5:29 PM    START taking these medications   Details  clindamycin (CLEOCIN) 300 MG capsule Take 1 capsule (300 mg total) by mouth 3 (three) times daily., Starting Wed 01/06/2017, Until Sat 01/16/2017, Print         Note:  This document was prepared using Dragon voice recognition software and may include unintentional dictation errors.    Chinita Pester, FNP 01/09/17 1357    Minna Antis, MD 01/11/17 2222

## 2017-01-06 NOTE — ED Triage Notes (Signed)
Pt states her therapist told her to come to ED to get meds for her anxiety and panic attacks until she can see psyhiatrist. Pt states panic attacks this past week, alert, oriented, has baby with her. Asking for STD checks.

## 2017-01-06 NOTE — Discharge Instructions (Signed)
For your anxiety, you can take 25mg  of Benadryl. For the bacterial vaginosis, you need to take the clindamycin as prescribed. Watch the baby for diarrhea and have her see her pediatrician for any concerns. Return to the ER for symptoms that change or worsen if unable to schedule an appointment.

## 2017-01-07 ENCOUNTER — Telehealth: Payer: Self-pay | Admitting: Emergency Medicine

## 2017-01-07 NOTE — Telephone Encounter (Signed)
Called patient to inform of std test results.  Per dr Cyril Loosenkinner call in azithromycin 1 gram po.  Med called to [facility].  Pt says she will notify partners.  Informed of free std treatment at [facility].

## 2017-01-28 ENCOUNTER — Ambulatory Visit: Payer: Medicaid Other | Admitting: Obstetrics & Gynecology

## 2017-02-08 ENCOUNTER — Ambulatory Visit: Payer: Medicaid Other | Admitting: Maternal Newborn

## 2017-02-15 ENCOUNTER — Encounter: Payer: Self-pay | Admitting: Obstetrics and Gynecology

## 2017-02-15 ENCOUNTER — Ambulatory Visit (INDEPENDENT_AMBULATORY_CARE_PROVIDER_SITE_OTHER): Payer: Medicaid Other | Admitting: Obstetrics and Gynecology

## 2017-02-15 VITALS — BP 110/60 | HR 80 | Ht 64.0 in | Wt 170.0 lb

## 2017-02-15 DIAGNOSIS — Z124 Encounter for screening for malignant neoplasm of cervix: Secondary | ICD-10-CM | POA: Diagnosis not present

## 2017-02-15 DIAGNOSIS — A749 Chlamydial infection, unspecified: Secondary | ICD-10-CM | POA: Diagnosis not present

## 2017-02-15 DIAGNOSIS — Z1151 Encounter for screening for human papillomavirus (HPV): Secondary | ICD-10-CM | POA: Diagnosis not present

## 2017-02-15 DIAGNOSIS — Z113 Encounter for screening for infections with a predominantly sexual mode of transmission: Secondary | ICD-10-CM

## 2017-02-15 DIAGNOSIS — Z8742 Personal history of other diseases of the female genital tract: Secondary | ICD-10-CM

## 2017-02-15 DIAGNOSIS — Z87898 Personal history of other specified conditions: Secondary | ICD-10-CM | POA: Diagnosis not present

## 2017-02-15 DIAGNOSIS — B081 Molluscum contagiosum: Secondary | ICD-10-CM | POA: Diagnosis not present

## 2017-02-15 NOTE — Progress Notes (Signed)
Chief Complaint  Patient presents with  . Exposure to STD    std screening, rash on legs    HPI:      Alyssa Kemp is a 26 y.o. [id number] who LMP was Patient's last menstrual period was 02/06/2017 (exact date)., presents today for STD testing. She was diagnosed with chlamydia last month in the ED and treated with azithro 1 g x 1 dose. Partner was also treated. Pt needs TOC. No sx. She was having DUB sx and found to have chlamydia; however, pt has had sx again and is breastfeeding. She is s/p TL.   She has also had bumps on her RT inner thigh for the past few months. They do not itch/hurt. They have spread a little.  She is also due for repeat pap smear. She had LGSIL 6/17 and colpo 7/17 with CIN 1 on bx.    Past Medical History:  Diagnosis Date  . Bipolar depression (HCC)   . GERD (gastroesophageal reflux disease)   . Gestational diabetes   . History of macrosomia in infant in prior pregnancy, currently pregnant   . 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 Scoliosis     Past Surgical History:  Procedure Laterality Date  . ADENOIDECTOMY    . 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 TONSILLECTOMY    . TUBAL LIGATION Bilateral 09/19/2016   Procedure: POST PARTUM TUBAL LIGATION;  Surgeon: [name], MD;  Location: [facility] ORS;  Service: Gynecology;  Laterality: Bilateral;  . WISDOM TOOTH EXTRACTION      Family History  Problem Relation Age of Onset  . Cervical cancer Mother   . Ovarian cancer Mother   . Hypertension Maternal Grandmother   . Pancreatic cancer Maternal Grandmother   . Diabetes Maternal Grandmother   . Coronary artery disease Maternal Grandfather   . Congestive Heart Failure Maternal Grandfather   . Hypertension Maternal Grandfather   . Prostate cancer Maternal Grandfather   . Diabetes Maternal Grandfather   . Hypertension Paternal Grandmother   . Diabetes Paternal Grandmother   . Heart disease Paternal Grandfather   . Hypertension Paternal  Grandfather   . Breast cancer Paternal Aunt     Social History   Social History  . Marital status: Single    Spouse name: N/A  . Number of children: N/A  . Years of education: N/A   Occupational History  . Not on file.   Social History Main Topics  . Smoking status: Never Smoker  . Smokeless tobacco: Never Used  . Alcohol use No  . Drug use: No  . Sexual activity: Yes    Birth control/ protection: None, Surgical   Other Topics Concern  . Not on file   Social History Narrative  . No narrative on file     Current Outpatient Prescriptions:  .  escitalopram (LEXAPRO) 10 MG tablet, TK 1 T PO QD, Disp: , Rfl: 0 .  LORazepam (ATIVAN) 0.5 MG tablet, TK 1 T PO BID FOR ANXIETY, Disp: , Rfl: 0 .  QUEtiapine (SEROQUEL) 50 MG tablet, TK 1 T PO  QHS, Disp: , Rfl: 0 .  VYVANSE 20 MG capsule, Take 20 mg by mouth every morning., Disp: , Rfl: 0   ROS:  Review of Systems  Constitutional: Negative for fever.  Gastrointestinal: Negative for blood in stool, constipation, diarrhea, nausea and vomiting.  Genitourinary: Negative for dyspareunia, dysuria, flank pain, frequency, hematuria, urgency, vaginal bleeding, vaginal discharge and vaginal  pain.  Musculoskeletal: Negative for back pain.  Skin: Positive for rash.     OBJECTIVE:   Vitals:  BP 110/60   Pulse 80   Ht 5\' 4"  (1.626 m)   Wt 170 lb (77.1 kg)   LMP 02/06/2017 (Exact Date)   BMI 29.18 kg/m   Physical Exam  Constitutional: She is oriented to person, place, and time and well-developed, well-nourished, and in no distress. Vital signs are normal.  Genitourinary: Vagina normal, uterus normal, cervix normal, right adnexa normal, left adnexa normal and vulva normal. Uterus is not enlarged. Cervix exhibits no motion tenderness and no tenderness. Right adnexum displays no mass and no tenderness. Left adnexum displays no mass and no tenderness. Vulva exhibits no erythema, no exudate, no lesion, no rash and no tenderness. Vagina  exhibits no lesion.  Neurological: She is oriented to person, place, and time.  Skin: Skin is warm. Rash noted. Rash is papular. Rash is not pustular.  FEW MOLLUSCUM CONTAGIOSUM LESIONS RT INNER THIGH  Psychiatric: Memory, affect and judgment normal.  Vitals reviewed.    Assessment/Plan: Cervical cancer screening - Plan: IGP, Aptima HPV  Screening for HPV (human papillomavirus) - Will f/u with results.  - Plan: IGP, Aptima HPV  History of abnormal cervical Pap smear - Plan: IGP, Aptima HPV  Screening for STD (sexually transmitted disease) - Chlamydia TOC. Will notify pt of results.  - Plan: Chlamydia/Gonococcus/Trichomonas, NAA  Chlamydia  Molluscum contagiosum - Lesions discussed. Pt can unroof or leave alone. F/u prn.      Return if symptoms worsen or fail to improve.  Sherriann Szuch B. Rashi Granier, PA-C 02/15/2017 12:02 PM

## 2017-02-17 LAB — IGP, APTIMA HPV
HPV Aptima: POSITIVE — AB
PAP SMEAR COMMENT: 0

## 2017-02-23 LAB — CHLAMYDIA/GONOCOCCUS/TRICHOMONAS, NAA
Chlamydia by NAA: NEGATIVE
Gonococcus by NAA: NEGATIVE
Trich vag by NAA: NEGATIVE

## 2017-03-20 ENCOUNTER — Emergency Department
Admission: EM | Admit: 2017-03-20 | Discharge: 2017-03-20 | Disposition: A | Discharge status: Home / Self Care | Payer: Medicaid Other | Attending: Emergency Medicine | Admitting: Emergency Medicine

## 2017-03-20 ENCOUNTER — Encounter: Payer: Self-pay | Admitting: Emergency Medicine

## 2017-03-20 DIAGNOSIS — N72 Inflammatory disease of cervix uteri: Secondary | ICD-10-CM | POA: Diagnosis not present

## 2017-03-20 DIAGNOSIS — M545 Low back pain: Secondary | ICD-10-CM | POA: Insufficient documentation

## 2017-03-20 DIAGNOSIS — R102 Pelvic and perineal pain: Secondary | ICD-10-CM | POA: Diagnosis present

## 2017-03-20 DIAGNOSIS — Z79899 Other long term (current) drug therapy: Secondary | ICD-10-CM | POA: Diagnosis not present

## 2017-03-20 DIAGNOSIS — N390 Urinary tract infection, site not specified: Secondary | ICD-10-CM | POA: Diagnosis not present

## 2017-03-20 LAB — WET PREP, GENITAL
Clue Cells Wet Prep HPF POC: NONE SEEN
Sperm: NONE SEEN
TRICH WET PREP: NONE SEEN
YEAST WET PREP: NONE SEEN

## 2017-03-20 LAB — URINALYSIS, COMPLETE (UACMP) WITH MICROSCOPIC
BACTERIA UA: NONE SEEN
Bilirubin Urine: NEGATIVE
Glucose, UA: NEGATIVE mg/dL
Ketones, ur: NEGATIVE mg/dL
NITRITE: POSITIVE — AB
PROTEIN: NEGATIVE mg/dL
SPECIFIC GRAVITY, URINE: 1.021 (ref 1.005–1.030)
pH: 6 (ref 5.0–8.0)

## 2017-03-20 LAB — CBC
HEMATOCRIT: 38.4 % (ref 35.0–47.0)
HEMOGLOBIN: 13.5 g/dL (ref 12.0–16.0)
MCH: 29.7 pg (ref 26.0–34.0)
MCHC: 35.2 g/dL (ref 32.0–36.0)
MCV: 84.2 fL (ref 80.0–100.0)
Platelets: 269 10*3/uL (ref 150–440)
RBC: 4.56 MIL/uL (ref 3.80–5.20)
RDW: 13.6 % (ref 11.5–14.5)
WBC: 9 10*3/uL (ref 3.6–11.0)

## 2017-03-20 LAB — COMPREHENSIVE METABOLIC PANEL
ALT: 21 U/L (ref 14–54)
AST: 23 U/L (ref 15–41)
Albumin: 4.1 g/dL (ref 3.5–5.0)
Alkaline Phosphatase: 71 U/L (ref 38–126)
Anion gap: 7 (ref 5–15)
BUN: 22 mg/dL — ABNORMAL HIGH (ref 6–20)
CO2: 25 mmol/L (ref 22–32)
Calcium: 9.1 mg/dL (ref 8.9–10.3)
Chloride: 106 mmol/L (ref 101–111)
Creatinine, Ser: 0.73 mg/dL (ref 0.44–1.00)
GFR calc Af Amer: >60 mL/min (ref >60)
GFR calc non Af Amer: >60 mL/min (ref >60)
Glucose, Bld: 115 mg/dL — ABNORMAL HIGH (ref 65–99)
Potassium: 4.1 mmol/L (ref 3.5–5.1)
Sodium: 138 mmol/L (ref 135–145)
TOTAL PROTEIN: 6.8 g/dL (ref 6.5–8.1)
Total Bilirubin: 0.8 mg/dL (ref 0.3–1.2)

## 2017-03-20 LAB — CHLAMYDIA/NGC RT PCR (ARMC ONLY)
Chlamydia Tr: DETECTED — AB
N GONORRHOEAE: NOT DETECTED

## 2017-03-20 LAB — POCT PREGNANCY, URINE: PREG TEST UR: NEGATIVE

## 2017-03-20 MED ORDER — CEFTRIAXONE SODIUM 1 G IJ SOLR
1.0000 g | Freq: Once | INTRAMUSCULAR | Status: AC
  Administered 2017-03-20: 1 g via INTRAMUSCULAR
  Filled 2017-03-20: qty 10

## 2017-03-20 MED ORDER — AZITHROMYCIN 500 MG PO TABS
1000.0000 mg | ORAL_TABLET | Freq: Once | ORAL | Status: AC
  Administered 2017-03-20: 1000 mg via ORAL
  Filled 2017-03-20: qty 2

## 2017-03-20 MED ORDER — CEPHALEXIN 500 MG PO CAPS: 500.0000 mg | ORAL_CAPSULE | Freq: Two times a day (BID) | ORAL | 0 refills | Status: DC

## 2017-03-20 MED ORDER — CEFTRIAXONE SODIUM IN DEXTROSE 20 MG/ML IV SOLN: 1.0000 g | Freq: Once | INTRAVENOUS | Status: DC

## 2017-03-20 NOTE — ED Notes (Signed)
Pt in NAD at time of d/c , VS stable. Pt verbalizes d/c teaching, RX ,and follow up.

## 2017-03-20 NOTE — Discharge Instructions (Signed)
You were treated for urinary tract infection with 24 hour IV dose of Rocephin antibiotic.  Start Keflex tomorrow.  you were treated with the Rocephin plus azithromycin antibiotic to cover for STDs called gonorrhea and Chlamydia which are suspected but not confirmed. Testing was sent, you may call by the emergency department and ask for the the nurse to give you the final results on Monday, 33 6-5 3 8-7 050.  Return to the emergency department immediately for any new or worsening pain, fever, inability to urinate, vaginal bleeding, or any other symptoms concerning to you.

## 2017-03-20 NOTE — ED Triage Notes (Signed)
Patient to ER for c/o pelvic and lower back pain. States she was treated with Cipro x7 days for UTI. States after a couple days of stopping UTI. Patient states she also wants to be checked for STD as she has vaginal odor.

## 2017-03-20 NOTE — ED Provider Notes (Signed)
[facility] Emergency Department Provider Note ____________________________________________   I have reviewed the triage vital signs and the triage nursing note.  HISTORY  Chief Complaint Pelvic Pain and Back Pain   Historian Patient  HPI Alyssa Kemp is a 26 y.o. female presents with urinary frequency and pelvic discomfort. States that she was recently treated with Macrobid for 3 days for urinary tract infection and then called that the culture showed resistance and was switched to Cipro for which she completed 7 days, completed about one week ago. She's had persistent or worsening symptoms over the past few days. She has some soreness in her pelvis as well as her low back. No fevers. No vomiting. No cough or trouble breathing.  Patient states that she is also having some mild vaginal discharge for a week or so and is concerned about possibly for STDs.  Numerous drug allergies, states that her allergy to penicillin/amoxicillin is unknown as she was a child. She has no idea about cephalosporins. Reports she cannot take sulfa antibiotic.    Past Medical History:  Diagnosis Date  . Bipolar depression (HCC)   . GERD (gastroesophageal reflux disease)   . Gestational diabetes   . History of macrosomia in infant in prior pregnancy, currently pregnant   . 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 Scoliosis     Patient Active Problem List   Diagnosis Date Noted  . Chlamydia 02/15/2017  . Encounter for sterilization 09/19/2016  . Normal labor and delivery 09/18/2016  . Labor and delivery indication for care or intervention 09/14/2016  . Gestational diabetes mellitus (GDM) affecting pregnancy, antepartum 09/05/2016  . High risk pregnancy, antepartum 09/05/2016  . Bipolar 1 disorder (HCC) 09/05/2016  . Rh negative state in antepartum period 09/05/2016  . GERD (gastroesophageal reflux disease) 08/28/2016  . 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   . Anxiety, generalized 11/05/2015  . 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/05/2015  . History of chlamydia infection 11/05/2015  . History of abnormal Pap smear 02/21/2013    Past Surgical History:  Procedure Laterality Date  . ADENOIDECTOMY    . 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 TONSILLECTOMY    . TUBAL LIGATION Bilateral 09/19/2016   Procedure: POST PARTUM TUBAL LIGATION;  Surgeon: [name], MD;  Location: [facility] ORS;  Service: Gynecology;  Laterality: Bilateral;  . WISDOM TOOTH EXTRACTION      Prior to Admission medications   Medication Sig Start Date End Date Taking? Authorizing Provider  cephALEXin (KEFLEX) 500 MG capsule Take 1 capsule (500 mg total) by mouth 2 (two) times daily. 03/20/17   Governor Rooks, MD  escitalopram (LEXAPRO) 10 MG tablet TK 1 T PO QD 02/11/17   [provider]  LORazepam (ATIVAN) 0.5 MG tablet TK 1 T PO BID FOR ANXIETY 01/07/17   [provider]  QUEtiapine (SEROQUEL) 50 MG tablet TK 1 T PO  QHS 02/11/17   [provider]  VYVANSE 20 MG capsule Take 20 mg by mouth every morning. 02/11/17   [provider]    Allergies  Allergen Reactions  . Sulfa Antibiotics Shortness Of Breath and Other (See Comments)    Other Reaction: OTHER REACTION  . Sulfacetamide Sodium Shortness Of Breath  . Sulfasalazine Shortness Of Breath  . Doxycycline Swelling  . Cantaloupe (Diagnostic) Swelling  . Amoxicillin-Pot Clavulanate Other (See Comments)  . Penicillins Other (See Comments)    Other reaction(s): Other (See Comments)  Family History  Problem Relation Age of Onset  . Cervical cancer Mother   . Ovarian cancer Mother   . Hypertension Maternal Grandmother   . Pancreatic cancer Maternal Grandmother   . Diabetes Maternal Grandmother   . Coronary artery disease Maternal Grandfather   . Congestive Heart Failure Maternal Grandfather   . Hypertension Maternal Grandfather   . Prostate cancer Maternal  Grandfather   . Diabetes Maternal Grandfather   . Hypertension Paternal Grandmother   . Diabetes Paternal Grandmother   . Heart disease Paternal Grandfather   . Hypertension Paternal Grandfather   . Breast cancer Paternal Aunt     Social History Social History  Substance Use Topics  . Smoking status: Never Smoker  . Smokeless tobacco: Never Used  . Alcohol use No    Review of Systems  Constitutional: Negative for fever. Eyes: Negative for visual changes. ENT: Negative for sore throat. Cardiovascular: Negative for chest pain. Respiratory: Negative for shortness of breath. Gastrointestinal: Negative for abdominal pain, vomiting and diarrhea. Genitourinary: Positive for dysuria. Musculoskeletal: Positive for low back pain. Skin: Negative for rash. Neurological: Negative for headache.  ____________________________________________   PHYSICAL EXAM:  VITAL SIGNS: ED Triage Vitals  Enc Vitals Group     BP 03/20/17 1141 (!) 109/57     Pulse Rate 03/20/17 1141 98     Resp 03/20/17 1141 16     Temp 03/20/17 1141 97.9 F (36.6 C)     Temp Source 03/20/17 1141 Oral     SpO2 03/20/17 1141 98 %     Weight 03/20/17 1142 170 lb (77.1 kg)     Height 03/20/17 1142  (1.626 m)     Head Circumference --      Peak Flow --      Pain Score 03/20/17 1141 7     Pain Loc --      Pain Edu? --      Excl. in GC? --      Constitutional: Alert and oriented. Well appearing and in no distress. HEENT   Head: Normocephalic and atraumatic.      Eyes: Conjunctivae are normal. Pupils equal and round.       Ears:         Nose: No congestion/rhinnorhea.   Mouth/Throat: Mucous membranes are moist.   Neck: No stridor. Cardiovascular/Chest: Normal rate, regular rhythm.  No murmurs, rubs, or gallops. Respiratory: Normal respiratory effort without tachypnea nor retractions. Breath sounds are clear and equal bilaterally. No wheezes/rales/rhonchi. Gastrointestinal: Soft. No distention,  no guarding, no rebound. Mild low abdominal discomfort without focal right lower quadrant tenderness.  Genitourinary/rectal: moderate amount of yellow discharge.  Possible mild cervicitis but no significant adnexal tenderness or mass. Musculoskeletal: Nontender with normal range of motion in all extremities. No joint effusions.  No lower extremity tenderness.  No edema. Neurologic:  Normal speech and language. No gross or focal neurologic deficits are appreciated. Skin:  Skin is warm, dry and intact. No rash noted. Psychiatric: Mood and affect are normal. Speech and behavior are normal. Patient exhibits appropriate insight and judgment.   ____________________________________________  LABS (pertinent positives/negatives) I, Governor Rooks, MD the attending physician have reviewed the labs noted below.  Labs Reviewed  WET PREP, GENITAL - Abnormal; Notable for the following:       Result Value   WBC, Wet Prep HPF POC TOO NUMEROUS TO COUNT (*)    All other components within normal limits  COMPREHENSIVE METABOLIC PANEL - Abnormal; Notable for the following:  Glucose, Bld 115 (*)    BUN 22 (*)    All other components within normal limits  URINALYSIS, COMPLETE (UACMP) WITH MICROSCOPIC - Abnormal; Notable for the following:    Color, Urine AMBER (*)    APPearance CLEAR (*)    Hgb urine dipstick MODERATE (*)    Nitrite POSITIVE (*)    Leukocytes, UA TRACE (*)    Squamous Epithelial / LPF 0-5 (*)    All other components within normal limits  URINE CULTURE  CHLAMYDIA/NGC RT PCR ([facility] ONLY)  CBC  POC URINE PREG, ED  POCT PREGNANCY, URINE    ____________________________________________    EKG I, Governor Rooks, MD, the attending physician have personally viewed and interpreted all ECGs.  none ____________________________________________  RADIOLOGY All Xrays were viewed by me.  Imaging interpreted by Radiologist, and I, Governor Rooks, MD the attending physician have reviewed  the radiologist interpretation noted below.  none __________________________________________  PROCEDURES  Procedure(s) performed: None  Critical Care performed: None  ____________________________________________  No current facility-administered medications on file prior to encounter.    Current Outpatient Prescriptions on File Prior to Encounter  Medication Sig Dispense Refill  . escitalopram (LEXAPRO) 10 MG tablet TK 1 T PO QD  0  . LORazepam (ATIVAN) 0.5 MG tablet TK 1 T PO BID FOR ANXIETY  0  . QUEtiapine (SEROQUEL) 50 MG tablet TK 1 T PO  QHS  0  . VYVANSE 20 MG capsule Take 20 mg by mouth every morning.  0  . [DISCONTINUED] propranolol ER (INDERAL LA) 60 MG 24 hr capsule Take 1 capsule (60 mg total) by mouth at bedtime. To decrease frequency/severity of headaches. 30 capsule 0    ____________________________________________  ED COURSE / ASSESSMENT AND PLAN  Pertinent labs & imaging results that were available during my care of the patient were reviewed by me and considered in my medical decision making (see chart for details).    Ms. Rosemeyer still has nitrite positive signs of urinary tract infection. She has been treated for this UTI with Macrobid with confirmed resistance, and then a course of Cipro and so I don't want to try these categories at this point. A chin does not know how she doesn't syphilis warrants, and reports unspecified penicillin allergy, so we did discuss risk and benefits and chose to proceed with treatment with Rocephin.  She was also concerned about possible STDs.. I am going to go ahead and cover her for possible gonorrhea and Chlamydia based off of symptoms, testing not available at time of treatment and discharge.  Patient was given 1 g IV Rocephin for UTI and STD. She is also given azithromycin for chlamydia. Patient does not seem to have evidence for PID overall. I'm not concerned about tubo-ovarian abscess.  Patient has some mild low back  discomfort but not significant one-sided flank pain, nor fevers, and have a low suspicion for pyelonephritis.  a urine culture was sent. I plan to send patient with a prescription for keflex.   DIFFERENTIAL DIAGNOSIS: Differential diagnosis includes, but is not limited to, ovarian cyst, ovarian torsion, acute appendicitis, diverticulitis, urinary tract infection/pyelonephritis, endometriosis, bowel obstruction, colitis, renal colic, gastroenteritis, hernia, pregnancy related pain including ectopic pregnancy, etc.   CONSULTATIONS:  None  Patient / Family / Caregiver informed of clinical course, medical decision-making process, and agree with plan.   I discussed return precautions, follow-up instructions, and discharge instructions with patient and/or family.  Discharge Instructions : You were treated for urinary tract infection with  24 hour IV dose of Rocephin antibiotic.  Start Keflex tomorrow.  you were treated with the Rocephin plus azithromycin antibiotic to cover for STDs called gonorrhea and Chlamydia which are suspected but not confirmed. Testing was sent, you may call by the emergency department and ask for the the nurse to give you the final results on Monday, 33 6-5 3 8-7 050.  Return to the emergency department immediately for any new or worsening pain, fever, inability to urinate, vaginal bleeding, or any other symptoms concerning to you.  ___________________________________________   FINAL CLINICAL IMPRESSION(S) / ED DIAGNOSES   Final diagnoses:  Urinary tract infection without hematuria, site unspecified  Cervicitis              Note: This dictation was prepared with Dragon dictation. Any transcriptional errors that result from this process are unintentional    Governor Rooks, MD 03/20/17 1332

## 2017-03-20 NOTE — ED Notes (Signed)
Pt requesting STD check 

## 2017-03-22 ENCOUNTER — Telehealth: Payer: Self-pay | Admitting: Emergency Medicine

## 2017-03-22 LAB — URINE CULTURE: Culture: >=100000 — AB

## 2017-03-22 NOTE — Telephone Encounter (Signed)
Patient called asking for std results.  Gave her results and instructed to inform partner and about free treatment at [facility].  She is asking about how long before she has intercourse.  Instructed 2 weeks  From treatment and that she can be retested at [facility] to assure she no longer has chlamydia.

## 2017-04-20 ENCOUNTER — Emergency Department
Admission: EM | Admit: 2017-04-20 | Discharge: 2017-04-20 | Disposition: A | Discharge status: Home / Self Care | Payer: Medicaid Other | Attending: Emergency Medicine | Admitting: Emergency Medicine

## 2017-04-20 ENCOUNTER — Emergency Department: Payer: Medicaid Other

## 2017-04-20 DIAGNOSIS — N760 Acute vaginitis: Secondary | ICD-10-CM | POA: Insufficient documentation

## 2017-04-20 DIAGNOSIS — R300 Dysuria: Secondary | ICD-10-CM | POA: Diagnosis present

## 2017-04-20 DIAGNOSIS — N3001 Acute cystitis with hematuria: Secondary | ICD-10-CM | POA: Insufficient documentation

## 2017-04-20 DIAGNOSIS — G8929 Other chronic pain: Secondary | ICD-10-CM | POA: Diagnosis not present

## 2017-04-20 DIAGNOSIS — B9689 Other specified bacterial agents as the cause of diseases classified elsewhere: Secondary | ICD-10-CM

## 2017-04-20 DIAGNOSIS — Z202 Contact with and (suspected) exposure to infections with a predominantly sexual mode of transmission: Secondary | ICD-10-CM | POA: Insufficient documentation

## 2017-04-20 DIAGNOSIS — Z79899 Other long term (current) drug therapy: Secondary | ICD-10-CM | POA: Diagnosis not present

## 2017-04-20 LAB — URINALYSIS, COMPLETE (UACMP) WITH MICROSCOPIC
Bilirubin Urine: NEGATIVE
Glucose, UA: NEGATIVE mg/dL
Ketones, ur: NEGATIVE mg/dL
Nitrite: NEGATIVE
PH: 5 (ref 5.0–8.0)
PROTEIN: NEGATIVE mg/dL
Specific Gravity, Urine: 1.025 (ref 1.005–1.030)

## 2017-04-20 LAB — WET PREP, GENITAL
SPERM: NONE SEEN
TRICH WET PREP: NONE SEEN
Yeast Wet Prep HPF POC: NONE SEEN

## 2017-04-20 LAB — CHLAMYDIA/NGC RT PCR (ARMC ONLY)
CHLAMYDIA TR: NOT DETECTED
N GONORRHOEAE: NOT DETECTED

## 2017-04-20 LAB — POCT PREGNANCY, URINE: Preg Test, Ur: NEGATIVE

## 2017-04-20 MED ORDER — METRONIDAZOLE 500 MG PO TABS: 500.0000 mg | ORAL_TABLET | Freq: Two times a day (BID) | ORAL | 0 refills | Status: AC

## 2017-04-20 MED ORDER — CEPHALEXIN 500 MG PO CAPS: 500.0000 mg | ORAL_CAPSULE | Freq: Two times a day (BID) | ORAL | 0 refills | Status: AC

## 2017-04-20 NOTE — ED Notes (Signed)
Pt ambulatory at discharge. Pt verbalized understanding of discharge instructions, prescriptions and follow-up care. Pt A&O x4. Pt states pain is chronic and she understands how to treat at home. Skin warm and dry. VSS.

## 2017-04-20 NOTE — ED Triage Notes (Signed)
Pt experiencing burning with urination, frequency. Pt alert and oriented X4, active, cooperative, pt in NAD. RR even and unlabored, color WNL.

## 2017-04-20 NOTE — ED Notes (Signed)
Pt back from CT

## 2017-04-20 NOTE — ED Notes (Signed)
Pt reports burning sensation during urination with discharge, although the discharge amount and odor is normal for her. Pt reports giving birth to her child in March and has had UTI-like symptoms almost monthly ever since. Pt states she has tested positive for chlamydia in the past and was treated, but would like to be tested again.

## 2017-04-20 NOTE — ED Notes (Signed)
Patient transported to CT 

## 2017-04-20 NOTE — ED Notes (Signed)
Urine preg POC negative 

## 2017-04-20 NOTE — ED Provider Notes (Signed)
[facility] Emergency Department Provider Note   ____________________________________________   I have reviewed the triage vital signs and the nursing notes.   HISTORY  Chief Complaint Urinary Tract Infection    HPI Alyssa Kemp is a 26 y.o. female presents to the emergency department with dysuria, burning with urination, decreased frequency, flank pain, malodorous urine and vaginal irritation with malodorous yellow-whitish discharge. Patient is concerned for STDs today, specifically chlamydia. She has been treated for chlamydia in the past and is unsure if she has contracted the infection again. Patient reports history of recurrent UTIs during her pregnancy and she feels current urinary symptoms are similar.  Patient denies fever, chills, headache, vision changes, chest pain, chest tightness, shortness of breath, abdominal pain, nausea and vomiting.  Past Medical History:  Diagnosis Date  . Bipolar depression (HCC)   . GERD (gastroesophageal reflux disease)   . Gestational diabetes   . History of macrosomia in infant in prior pregnancy, currently pregnant   . 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 Scoliosis     Patient Active Problem List   Diagnosis Date Noted  . Chlamydia 02/15/2017  . Encounter for sterilization 09/19/2016  . Normal labor and delivery 09/18/2016  . Labor and delivery indication for care or intervention 09/14/2016  . Gestational diabetes mellitus (GDM) affecting pregnancy, antepartum 09/05/2016  . High risk pregnancy, antepartum 09/05/2016  . Bipolar 1 disorder (HCC) 09/05/2016  . Rh negative state in antepartum period 09/05/2016  . GERD (gastroesophageal reflux disease) 08/28/2016  . 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 Anxiety, generalized 11/05/2015  . 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/05/2015  . History of chlamydia infection 11/05/2015  . History of abnormal Pap smear 02/21/2013     Past Surgical History:  Procedure Laterality Date  . ADENOIDECTOMY    . 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 TONSILLECTOMY    . TUBAL LIGATION Bilateral 09/19/2016   Procedure: POST PARTUM TUBAL LIGATION;  Surgeon: Conard NovakStephen D Jackson, MD;  Location: [facility] ORS;  Service: Gynecology;  Laterality: Bilateral;  . WISDOM TOOTH EXTRACTION      Prior to Admission medications   Medication Sig Start Date End Date Taking? Authorizing Provider  cephALEXin (KEFLEX) 500 MG capsule Take 1 capsule (500 mg total) by mouth 2 (two) times daily. 04/20/17 04/30/17  Mada Sadik M, PA-C  escitalopram (LEXAPRO) 10 MG tablet TK 1 T PO QD 02/11/17   [provider]  LORazepam (ATIVAN) 0.5 MG tablet TK 1 T PO BID FOR ANXIETY 01/07/17   [provider]  metroNIDAZOLE (FLAGYL) 500 MG tablet Take 1 tablet (500 mg total) by mouth 2 (two) times daily. 04/20/17 04/27/17  Zackeriah Kissler M, PA-C  QUEtiapine (SEROQUEL) 50 MG tablet TK 1 T PO  QHS 02/11/17   [provider]  VYVANSE 20 MG capsule Take 20 mg by mouth every morning. 02/11/17   [provider]    Allergies Sulfa antibiotics; Sulfacetamide sodium; Sulfasalazine; Doxycycline; Cantaloupe (diagnostic); Amoxicillin-pot clavulanate; and Penicillins  Family History  Problem Relation Age of Onset  . Cervical cancer Mother   . Ovarian cancer Mother   . Hypertension Maternal Grandmother   . Pancreatic cancer Maternal Grandmother   . Diabetes Maternal Grandmother   . Coronary artery disease Maternal Grandfather   . Congestive Heart Failure Maternal Grandfather   . Hypertension Maternal Grandfather   . Prostate cancer Maternal Grandfather   . Diabetes Maternal Grandfather   .  Hypertension Paternal Grandmother   . Diabetes Paternal Grandmother   . Heart disease Paternal Grandfather   . Hypertension Paternal Grandfather   . Breast cancer Paternal Aunt     Social History Social History  Substance Use Topics  . Smoking status:  Never Smoker  . Smokeless tobacco: Never Used  . Alcohol use No    Review of Systems Constitutional: Negative for fever/chills Eyes: No visual changes. Cardiovascular: Denies chest pain. Respiratory: Denies shortness of breath. Gastrointestinal: No abdominal pain.  No nausea, vomiting, diarrhea. Genitourinary: Positive for vaginal irritation and malodorous yellow-whitish discharge. Positive for dysuria and increased frequency.  Musculoskeletal: Positive for flank back pain. Skin: Negative for rash. Neurological: Negative for headaches.  ____________________________________________   PHYSICAL EXAM:  VITAL SIGNS: ED Triage Vitals [04/20/17 1258]  Enc Vitals Group     BP 109/60     Pulse Rate 82     Resp 18     Temp 97.7 F (36.5 C)     Temp Source Axillary     SpO2 100 %     Weight 177 lb (80.3 kg)     Height 5\' 3"  (1.6 m)     Head Circumference      Peak Flow      Pain Score 0     Pain Loc      Pain Edu?      Excl. in GC?     Constitutional: Alert and oriented. Well appearing and in no acute distress.  Head: Normocephalic and atraumatic.  Cardiovascular: Normal rate, regular rhythm. Respiratory: Normal respiratory effort without tachypnea or retractions. Gastrointestinal: Bowel sounds 4 quadrants. Soft and nontender to palpation. Left side CVA tenderness.  Genitourinary: Dysuria, increased urinary frequency. Vaginal irritation and malodorous yellowish discharge.  No lower pelvic pain.  Pelvic exam: external exam appeared normal.  Malodorous yellowish-white discharge.  Cervix appeared normal. Musculoskeletal: Nontender with normal range of motion in all extremities. Neurologic: Normal speech and language.  Skin:  Skin is warm, dry and intact. No rash noted. Psychiatric: Mood and affect are normal. Speech and behavior are normal. Patient exhibits appropriate insight and judgement.  ____________________________________________   LABS (all labs ordered are listed, but  only abnormal results are displayed)  Labs Reviewed  WET PREP, GENITAL - Abnormal; Notable for the following:       Result Value   Clue Cells Wet Prep HPF POC PRESENT (*)    WBC, Wet Prep HPF POC MODERATE (*)    All other components within normal limits  URINALYSIS, COMPLETE (UACMP) WITH MICROSCOPIC - Abnormal; Notable for the following:    Color, Urine YELLOW (*)    APPearance CLEAR (*)    Hgb urine dipstick MODERATE (*)    Leukocytes, UA TRACE (*)    Bacteria, UA RARE (*)    Squamous Epithelial / LPF 0-5 (*)    All other components within normal limits  CHLAMYDIA/NGC RT PCR ([facility] ONLY)  POC URINE PREG, ED  POCT PREGNANCY, URINE   ____________________________________________  EKG none ____________________________________________  RADIOLOGY CT renal study IMPRESSION: 1. Urinary bladder wall is felt to be thickened. Suspect a degree of cystitis.  2. Stomach wall appears somewhat thickened, although no attempt has been made to distend the stomach. A degree of gastritis cannot be excluded. Bowel wall elsewhere appears unremarkable. No bowel obstruction. No free air or portal venous air.  3. No renal or ureteral calculus. No hydronephrosis.  4. No abscess. No periappendiceal region inflammation.  5. Subcentimeter mesenteric lymph  nodes, regarded as nonspecific.  6. Minimal ventral hernia containing only fat.  7. Relatively mild osteitis condensans ilia bilaterally, a finding of questionable clinical significance. ____________________________________________   PROCEDURES  Procedure(s) performed: no    Critical Care performed: no ____________________________________________   INITIAL IMPRESSION / ASSESSMENT AND PLAN / ED COURSE  Pertinent labs & imaging results that were available during my care of the patient were reviewed by me and considered in my medical decision making (see chart for details).   Patient presented to the emergency  department with dysuria, increased urinary frequency, flank pain vaginal discharge, concern for bacterial infection and possible STD exposure. History, physical exam, labs and imaging are reassuring symptoms consistent with acute cystitis and bacterial vaginosis. CT renal study findings were negative and physical exam was reassuring to rule out pyelonephritis and nephrolithiasis. Labs were pending for chlamydia and gonorrhea as time patient was discharged. Patient deferred treatment and stated she would like to be contacted if chlamydia and gonorrhea was positive. She verbalized she would return for treatment.  Patient prescribed metronidazole and cephalexin for antibiotic coverage.  Patient advised to follow up with PCP as needed or return to the emergency department if she experienced worsening symptoms. Patientinformed of clinical course, understand medical decision-making process, and agree with plan.   ____________________________________________   FINAL CLINICAL IMPRESSION(S) / ED DIAGNOSES  Final diagnoses:  Acute cystitis with hematuria  BV (bacterial vaginosis)  Potential exposure to STD       NEW MEDICATIONS STARTED DURING THIS VISIT:  Discharge Medication List as of 04/20/2017  4:05 PM    START taking these medications   Details  metroNIDAZOLE (FLAGYL) 500 MG tablet Take 1 tablet (500 mg total) by mouth 2 (two) times daily., Starting Tue 04/20/2017, Until Tue 04/27/2017, Print         Note:  This document was prepared using Dragon voice recognition software and may include unintentional dictation errors.    Deatrice Spanbauer, [place], PA-C 04/20/17 1945    Phineas Semen, MD 04/20/17 2127

## 2017-04-21 ENCOUNTER — Telehealth: Payer: Self-pay | Admitting: Emergency Medicine

## 2017-04-21 NOTE — Telephone Encounter (Signed)
Patient called for std results.  Gave her results

## 2017-05-19 ENCOUNTER — Emergency Department: Payer: No Typology Code available for payment source

## 2017-05-19 ENCOUNTER — Other Ambulatory Visit: Payer: Self-pay

## 2017-05-19 ENCOUNTER — Emergency Department
Admission: EM | Admit: 2017-05-19 | Discharge: 2017-05-19 | Disposition: A | Discharge status: Home / Self Care | Payer: No Typology Code available for payment source | Attending: Emergency Medicine | Admitting: Emergency Medicine

## 2017-05-19 ENCOUNTER — Encounter: Payer: Self-pay | Admitting: Emergency Medicine

## 2017-05-19 DIAGNOSIS — Y92410 Unspecified street and highway as the place of occurrence of the external cause: Secondary | ICD-10-CM | POA: Diagnosis not present

## 2017-05-19 DIAGNOSIS — G8929 Other chronic pain: Secondary | ICD-10-CM | POA: Diagnosis not present

## 2017-05-19 DIAGNOSIS — S161XXA Strain of muscle, fascia and tendon at neck level, initial encounter: Secondary | ICD-10-CM | POA: Diagnosis not present

## 2017-05-19 DIAGNOSIS — Y999 Unspecified external cause status: Secondary | ICD-10-CM | POA: Diagnosis not present

## 2017-05-19 DIAGNOSIS — Z79899 Other long term (current) drug therapy: Secondary | ICD-10-CM | POA: Insufficient documentation

## 2017-05-19 DIAGNOSIS — S199XXA Unspecified injury of neck, initial encounter: Secondary | ICD-10-CM | POA: Diagnosis present

## 2017-05-19 DIAGNOSIS — J0100 Acute maxillary sinusitis, unspecified: Secondary | ICD-10-CM | POA: Diagnosis not present

## 2017-05-19 DIAGNOSIS — Y939 Activity, unspecified: Secondary | ICD-10-CM | POA: Insufficient documentation

## 2017-05-19 MED ORDER — BENZONATATE 100 MG PO CAPS: 200.0000 mg | ORAL_CAPSULE | Freq: Three times a day (TID) | ORAL | 0 refills | Status: DC | PRN

## 2017-05-19 MED ORDER — CLARITHROMYCIN 500 MG PO TABS: 500.0000 mg | ORAL_TABLET | Freq: Two times a day (BID) | ORAL | 0 refills | Status: DC

## 2017-05-19 MED ORDER — FEXOFENADINE-PSEUDOEPHED ER 60-120 MG PO TB12: 1.0000 | ORAL_TABLET | Freq: Two times a day (BID) | ORAL | 0 refills | Status: DC

## 2017-05-19 MED ORDER — METHOCARBAMOL 750 MG PO TABS: 750.0000 mg | ORAL_TABLET | Freq: Four times a day (QID) | ORAL | 0 refills | Status: DC

## 2017-05-19 MED ORDER — METHYLPREDNISOLONE 4 MG PO TBPK: ORAL_TABLET | ORAL | 0 refills | Status: DC
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
## 2017-05-26 ENCOUNTER — Ambulatory Visit: Payer: Self-pay | Admitting: Urology

## 2017-05-28 ENCOUNTER — Ambulatory Visit: Payer: Self-pay | Admitting: Urology

## 2017-06-02 NOTE — Progress Notes (Deleted)
06/03/2017 1:13 PM   Alyssa Kemp [birth year] [id number]  Referring provider: [facility], [address] [place], [place] [address]  No chief complaint on file.   HPI: Patient is a 26 -year-old Caucasian female who is referred to [place] by Elvera Bicker, PA  for recurrent urinary tract infections.  Patient states that she has had *** urinary tract infections over the last year.  Reviewing her records,  she has had *** .    Her symptoms with a urinary tract infection consist of ***.  She denies/endorses dysuria, gross hematuria, suprapubic pain, back pain, abdominal pain or flank pain.***  She has not had any recent fevers, chills, nausea or vomiting. ***  She does/does not have a history of nephrolithiasis, GU surgery or GU trauma. ***  She is/is not sexually active.  She has/has not noted a correlation with her urinary tract infections and sexual intercourse.  ***   She does/does not engage in anal sex. ***  She is/ is not having anal to vaginal sex.*** She is/is not voiding before and after sex. ***     She is/is not postmenopausal. ***  She admits to/denies constipation and/or diarrhea. ***  She does/does not use tampons.  She does/does not engage in good perineal hygiene. She does/does not take tub baths. ***  She has/does not have incontinence.  She is using incontinence pads. ***  She is having/ not having pain with bladder filling.  ***  She has/not had any recent imaging studies.  ***  She is drinking *** of water daily.     Reviewed referral notes.    PMH: Past Medical History:  Diagnosis Date  . Bipolar depression ([place])   . GERD (gastroesophageal reflux disease)   . Gestational diabetes   . History of macrosomia in infant in prior pregnancy, currently pregnant   . 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 Scoliosis     Surgical History: Past Surgical History:  Procedure Laterality Date  . ADENOIDECTOMY    . COLPOSCOPY   01/15/2016   CIN 1  . TONSILLECTOMY    . TUBAL LIGATION Bilateral 09/19/2016   Procedure: POST PARTUM TUBAL LIGATION;  Surgeon: Will Bonnet, MD;  Location: [facility] ORS;  Service: Gynecology;  Laterality: Bilateral;  . WISDOM TOOTH EXTRACTION      Home Medications:  Allergies as of 06/03/2017      Reactions   Sulfa Antibiotics Shortness Of Breath, Other (See Comments)   Other Reaction: OTHER REACTION   Sulfacetamide Sodium Shortness Of Breath   Sulfasalazine Shortness Of Breath   Doxycycline Swelling   Cantaloupe (diagnostic) Swelling   Amoxicillin-pot Clavulanate Other (See Comments)   Penicillins Other (See Comments)   Other reaction(s): Other (See Comments)      Medication List        Accurate as of 06/02/17  1:13 PM. Always use your most recent med list.          benzonatate 100 MG capsule Commonly known as:  TESSALON PERLES Take 2 capsules (200 mg total) by mouth 3 (three) times daily as needed for cough.   clarithromycin 500 MG tablet Commonly known as:  BIAXIN Take 1 tablet (500 mg total) by mouth 2 (two) times daily.   escitalopram 10 MG tablet Commonly known as:  LEXAPRO TK 1 T PO QD   fexofenadine-pseudoephedrine 60-120 MG 12 hr tablet Commonly known as:  ALLEGRA-D Take 1 tablet by mouth 2 (two) times daily.  LORazepam 0.5 MG tablet Commonly known as:  ATIVAN TK 1 T PO BID FOR ANXIETY   methocarbamol 750 MG tablet Commonly known as:  ROBAXIN-750 Take 1 tablet (750 mg total) by mouth 4 (four) times daily.   methylPREDNISolone 4 MG Tbpk tablet Commonly known as:  MEDROL DOSEPAK Take Tapered dose as directed   QUEtiapine 50 MG tablet Commonly known as:  SEROQUEL TK 1 T PO  QHS   VYVANSE 20 MG capsule Generic drug:  lisdexamfetamine Take 20 mg by mouth every morning.       Allergies:  Allergies  Allergen Reactions  . Sulfa Antibiotics Shortness Of Breath and Other (See Comments)    Other Reaction: OTHER REACTION  . Sulfacetamide Sodium  Shortness Of Breath  . Sulfasalazine Shortness Of Breath  . Doxycycline Swelling  . Cantaloupe (Diagnostic) Swelling  . Amoxicillin-Pot Clavulanate Other (See Comments)  . Penicillins Other (See Comments)    Other reaction(s): Other (See Comments)    Family History: Family History  Problem Relation Age of Onset  . Cervical cancer Mother   . Ovarian cancer Mother   . Hypertension Maternal Grandmother   . Pancreatic cancer Maternal Grandmother   . Diabetes Maternal Grandmother   . Coronary artery disease Maternal Grandfather   . Congestive Heart Failure Maternal Grandfather   . Hypertension Maternal Grandfather   . Prostate cancer Maternal Grandfather   . Diabetes Maternal Grandfather   . Hypertension Paternal Grandmother   . Diabetes Paternal Grandmother   . Heart disease Paternal Grandfather   . Hypertension Paternal Grandfather   . Breast cancer Paternal Aunt     Social History:  reports that  has never smoked. she has never used smokeless tobacco. She reports that she does not drink alcohol or use drugs.  ROS:                                        Physical Exam: There were no vitals taken for this visit.  Constitutional: Well nourished. Alert and oriented, No acute distress. HEENT: [place] AT, moist mucus membranes. Trachea midline, no masses. Cardiovascular: No clubbing, cyanosis, or edema. Respiratory: Normal respiratory effort, no increased work of breathing. GI: Abdomen is soft, non tender, non distended, no abdominal masses. Liver and spleen not palpable.  No hernias appreciated.  Stool sample for occult testing is not indicated.   GU: No CVA tenderness.  No bladder fullness or masses.  Normal external genitalia, normal pubic hair distribution, no lesions.  Normal urethral meatus, no lesions, no prolapse, no discharge.   No urethral masses, tenderness and/or tenderness. No bladder fullness, tenderness or masses. Normal vagina mucosa, good estrogen  effect, no discharge, no lesions, good pelvic support, no cystocele or rectocele noted.  No cervical motion tenderness.  Uterus is freely mobile and non-fixed.  No adnexal/parametria masses or tenderness noted.  Anus and perineum are without rashes or lesions.   *** Skin: No rashes, bruises or suspicious lesions. Lymph: No cervical or inguinal adenopathy. Neurologic: Grossly intact, no focal deficits, moving all 4 extremities. Psychiatric: Normal mood and affect.  Laboratory Data: Lab Results  Component Value Date   WBC 9.0 03/20/2017   HGB 13.5 03/20/2017   HCT 38.4 03/20/2017   MCV 84.2 03/20/2017   PLT 269 03/20/2017    Lab Results  Component Value Date   CREATININE 0.73 03/20/2017     Lab Results  Component  Value Date   AST 23 03/20/2017   Lab Results  Component Value Date   ALT 21 03/20/2017    Urinalysis ***  I have reviewed the labs.   Pertinent Imaging: *** I have independently reviewed the films.    Assessment & Plan:  ***  1. Recurrent UTI's  - criteria for recurrent UTI has been met with 2 or more infections in 6 months or 3 or greater infections in one year ***  - Patient is instructed to increase their water intake until the urine is pale yellow or clear (10 to 12 cups daily) ***  - probiotics (yogurt, oral pills or vaginal suppositories), take cranberry pills or drink the juice and Vitamin C 1,000 mg daily to acidify the urine should be added to their daily regimen ***  -. if using tampons, she should remove them prior to urinating and change them often ***  -avoid soaking in tubs and wipe front to back after urinating ***  - benefit from core strengthening exercises has been seen.  We can refer her to PT if they desire ***  - advised them to have CATH UA's for urinalysis and culture to prevent skin contamination of the specimen  - reviewed symptoms of UTI and advised not to have urine checked or be treated for UTI if not experiencing symptoms  -  discussed antibiotic stewardship with the patient                                                   No Follow-up on file.  These notes generated with voice recognition software. I apologize for typographical errors.  Zara Council, [facility] [address], [place], [place] [address] [phone]

## 2017-06-03 ENCOUNTER — Encounter: Payer: Self-pay | Admitting: Urology

## 2017-06-03 ENCOUNTER — Ambulatory Visit: Payer: Self-pay | Admitting: Urology

## 2017-07-02 ENCOUNTER — Encounter: Payer: Self-pay | Admitting: Family Medicine

## 2017-07-02 ENCOUNTER — Ambulatory Visit (INDEPENDENT_AMBULATORY_CARE_PROVIDER_SITE_OTHER): Payer: Medicaid Other | Admitting: Family Medicine

## 2017-07-02 VITALS — BP 112/70 | HR 73 | Temp 97.5°F | Ht 64.0 in | Wt 178.9 lb

## 2017-07-02 DIAGNOSIS — R635 Abnormal weight gain: Secondary | ICD-10-CM

## 2017-07-02 DIAGNOSIS — M5441 Lumbago with sciatica, right side: Secondary | ICD-10-CM | POA: Diagnosis not present

## 2017-07-02 DIAGNOSIS — F319 Bipolar disorder, unspecified: Secondary | ICD-10-CM | POA: Diagnosis not present

## 2017-07-02 DIAGNOSIS — G8929 Other chronic pain: Secondary | ICD-10-CM | POA: Diagnosis not present

## 2017-07-02 DIAGNOSIS — Z7689 Persons encountering health services in other specified circumstances: Secondary | ICD-10-CM

## 2017-07-02 MED ORDER — LIDOCAINE 5 % EX PTCH: 1.0000 | MEDICATED_PATCH | CUTANEOUS | 3 refills | Status: DC

## 2017-07-02 NOTE — Progress Notes (Signed)
BP 112/70 (BP Location: Right Arm, Patient Position: Sitting, Cuff Size: Normal)   Pulse 73   Temp (!) 97.5 F (36.4 C) (Oral)   Ht 5\' 4"  (1.626 m)   Wt 178 lb 14.4 oz (81.1 kg)   SpO2 96%   BMI 30.71 kg/m    Subjective:    [name], female    DOB: 1990/09/06, 27 y.o.   MRN: [id number]  HPI: Alyssa Kemp is a 27 y.o. female  Chief Complaint  Patient presents with  . Establish Care  . Weight Gain    Patient states she's steadily gaining weight. Her psychiatrist explained to her to maybe check thyroid disorders due to family history.   Pt here today to establish care. Followed by Psychiatry for anxiety and bipolar 1. Doing well with them. Has been on current regimen for 8 months now.   Also has a hx of bulging discs, went to orthopedics and was told to try PT. Still having issues with occasional right sided sciatica.   Worried about weight gain. Pre-pregnancy she weighed about 150, now 178. Still breastfeeding infant. States the weight is slowly and steadily increasing the past 6 months or so. Psychiatrist told her it was not from her medicines and likely was from a thyroid issue as they run in her family. Denies major dietary changes or activity changes.   Past Medical History:  Diagnosis Date  . Bipolar depression (HCC)   . GERD (gastroesophageal reflux disease)   . Gestational diabetes   . History of macrosomia in infant in prior pregnancy, currently pregnant   . 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 Scoliosis    Social History   Socioeconomic History  . Marital status: Single    Spouse name: Not on file  . Number of children: Not on file  . Years of education: Not on file  . Highest education level: Not on file  Social Needs  . Financial resource strain: Not on file  . Food insecurity - worry: Not on file  . Food insecurity - inability: Not on file  . Transportation needs - medical: Not on file  . Transportation needs - non-medical:  Not on file  Occupational History  . Not on file  Tobacco Use  . Smoking status: Never Smoker  . Smokeless tobacco: Never Used  Substance and Sexual Activity  . Alcohol use: No  . Drug use: No  . Sexual activity: Yes    Birth control/protection: None, Surgical  Other Topics Concern  . Not on file  Social History Narrative  . Not on file   Relevant past medical, surgical, family and social history reviewed and updated as indicated. Interim medical history since our last visit reviewed. Allergies and medications reviewed and updated.  Review of Systems  Constitutional: Positive for unexpected weight change.  HENT: Negative.   Respiratory: Negative.   Cardiovascular: Negative.   Gastrointestinal: Negative.   Genitourinary: Negative.   Musculoskeletal: Positive for back pain.  Skin: Negative.   Psychiatric/Behavioral: Negative.     Per HPI unless specifically indicated above     Objective:    BP 112/70 (BP Location: Right Arm, Patient Position: Sitting, Cuff Size: Normal)   Pulse 73   Temp (!) 97.5 F (36.4 C) (Oral)   Ht 5\' 4"  (1.626 m)   Wt 178 lb 14.4 oz (81.1 kg)   SpO2 96%   BMI 30.71 kg/m   Wt Readings from Last 3 Encounters:  07/02/17 178 lb 14.4 oz (81.1 kg)  05/19/17 175 lb (79.4 kg)  04/20/17 177 lb (80.3 kg)    Physical Exam  Constitutional: She is oriented to person, place, and time. She appears well-developed and well-nourished. No distress.  HENT:  Head: Atraumatic.  Eyes: Conjunctivae are normal. Pupils are equal, round, and reactive to light. No scleral icterus.  Neck: Normal range of motion. Neck supple.  Cardiovascular: Normal rate and normal heart sounds.  Pulmonary/Chest: Effort normal and breath sounds normal.  Musculoskeletal: Normal range of motion.  Neurological: She is alert and oriented to person, place, and time. No cranial nerve deficit.  Skin: Skin is warm and dry.  Psychiatric: She has a normal mood and affect. Her behavior is  normal.  Nursing note and vitals reviewed.  Results for orders placed or performed in visit on 07/02/17  TSH  Result Value Ref Range   TSH 0.629 0.450 - 4.500 uIU/mL      Assessment & Plan:   Problem List Items Addressed This Visit      Nervous and Auditory   Chronic low back pain with right-sided sciatica    Discussed continued PT exercises, lidocaine patches, massage. Pt to f/u with orthopedics about next steps as PT has not been sufficient for her back pain.         Other   Bipolar 1 disorder (HCC)    Followed by Psychiatry, stable on current regimen. Continue per their instruction       Other Visit Diagnoses    Weight gain    -  Primary   Will r/o thyroid dz, but suspect particularly given timeframe that the seroquel could be majorly contributing. Focus on diet and lifestyle for now   Relevant Orders   TSH (Completed)   Encounter to establish care           Follow up plan: Return for CPE.

## 2017-07-03 LAB — TSH: TSH: 0.629 u[IU]/mL (ref 0.450–4.500)

## 2017-07-05 ENCOUNTER — Telehealth: Payer: Self-pay | Admitting: Family Medicine

## 2017-07-05 NOTE — Assessment & Plan Note (Signed)
Discussed continued PT exercises, lidocaine patches, massage. Pt to f/u with orthopedics about next steps as PT has not been sufficient for her back pain.

## 2017-07-05 NOTE — Telephone Encounter (Signed)
I had sent a mychart message to her this morning but just in case could you let her know her thyroid level came back normal?  Copied from CRM [phone]. Topic: General - Other >> Jul 05, 2017  9:16 AM Darletta MollLander, Lumin L wrote: Reason for CRM: Patient would like a call back to go over labs from Friday 07/02/2017.

## 2017-07-05 NOTE — Assessment & Plan Note (Signed)
Followed by Psychiatry, stable on current regimen. Continue per their instruction

## 2017-07-05 NOTE — Patient Instructions (Signed)
Follow up for CPE 

## 2017-07-05 NOTE — Telephone Encounter (Signed)
Patient notified

## 2017-07-09 ENCOUNTER — Telehealth: Payer: Self-pay

## 2017-07-09 NOTE — Telephone Encounter (Signed)
Lidocaine 5% patch p.a. Was initiated via covermymeds on 07/09/17. KEY: [id number]

## 2017-07-20 ENCOUNTER — Ambulatory Visit: Payer: Medicaid Other | Admitting: Family Medicine

## 2017-07-20 ENCOUNTER — Encounter: Payer: Self-pay | Admitting: Family Medicine

## 2017-07-20 VITALS — BP 110/74 | HR 75 | Temp 98.4°F | Wt 173.9 lb

## 2017-07-20 DIAGNOSIS — J0100 Acute maxillary sinusitis, unspecified: Secondary | ICD-10-CM | POA: Diagnosis not present

## 2017-07-20 DIAGNOSIS — R634 Abnormal weight loss: Secondary | ICD-10-CM | POA: Diagnosis not present

## 2017-07-20 MED ORDER — GUAIFENESIN-CODEINE 100-10 MG/5ML PO SOLN: 10.0000 mL | Freq: Three times a day (TID) | ORAL | 0 refills | Status: DC | PRN

## 2017-07-20 MED ORDER — AZITHROMYCIN 250 MG PO TABS: ORAL_TABLET | ORAL | 0 refills | Status: DC

## 2017-07-20 NOTE — Progress Notes (Signed)
BP 110/74 (BP Location: Right Arm, Patient Position: Sitting, Cuff Size: Normal)   Pulse 75   Temp 98.4 F (36.9 C) (Oral)   Wt 173 lb 14.4 oz (78.9 kg)   SpO2 99%   BMI 29.85 kg/m    Subjective:    [name], female    DOB: 01/01/1991, 27 y.o.   MRN: [id number]  HPI: Alyssa Kemp is a 27 y.o. female  Chief Complaint  Patient presents with  . Sinusitis    Ongoing for 2.5 weeks. Bloody and Green sputum.  . Cough    Productive.   3 weeks of bloody green congestion, productive cough, sinus pain and pressure, headache, and now having some fevers, sweats, aches. Taking mucinex and delsym with no relief. Denies CP, SOB, N/V. Her children have all been sick with the same thing recently.   Also states she's doing well with weight loss, has lost 5 lb this month just by cutting out all drinks other than water. Wanting to keep trying lifestyle modifications at this time.   Relevant past medical, surgical, family and social history reviewed and updated as indicated. Interim medical history since our last visit reviewed. Allergies and medications reviewed and updated.  Review of Systems  Constitutional: Positive for chills, diaphoresis and fever.  HENT: Positive for congestion, sinus pressure and sinus pain.   Eyes: Negative.   Respiratory: Positive for cough.   Cardiovascular: Negative.   Gastrointestinal: Negative.   Musculoskeletal: Positive for myalgias.  Neurological: Positive for headaches.  Psychiatric/Behavioral: Negative.    Per HPI unless specifically indicated above     Objective:    BP 110/74 (BP Location: Right Arm, Patient Position: Sitting, Cuff Size: Normal)   Pulse 75   Temp 98.4 F (36.9 C) (Oral)   Wt 173 lb 14.4 oz (78.9 kg)   SpO2 99%   BMI 29.85 kg/m   Wt Readings from Last 3 Encounters:  07/20/17 173 lb 14.4 oz (78.9 kg)  07/02/17 178 lb 14.4 oz (81.1 kg)  05/19/17 175 lb (79.4 kg)    Physical Exam  Constitutional: She is  oriented to person, place, and time. She appears well-developed and well-nourished. No distress.  HENT:  Head: Atraumatic.  B/l middle ear effusion Nasal mucosa and oropharynx erythematous, thick drainage present B/l max sinus ttp  Eyes: Conjunctivae are normal. Pupils are equal, round, and reactive to light.  Neck: Normal range of motion. Neck supple.  Cardiovascular: Normal rate and normal heart sounds.  Pulmonary/Chest: Effort normal and breath sounds normal. No respiratory distress.  Musculoskeletal: Normal range of motion.  Lymphadenopathy:    She has no cervical adenopathy.  Neurological: She is alert and oriented to person, place, and time.  Skin: Skin is warm and dry.  Psychiatric: She has a normal mood and affect. Her behavior is normal.  Nursing note and vitals reviewed.   Results for orders placed or performed in visit on 07/02/17  TSH  Result Value Ref Range   TSH 0.629 0.450 - 4.500 uIU/mL      Assessment & Plan:   Problem List Items Addressed This Visit    None    Visit Diagnoses    Acute maxillary sinusitis, recurrence not specified    -  Primary   Will tx with zpack, robitussin AC, and flonase. Sinus rinses, humidifier, vapor rubs prn. F/u if no improvement   Relevant Medications   guaiFENesin-codeine 100-10 MG/5ML syrup   azithromycin (ZITHROMAX) 250 MG tablet  Weight loss       Congratulated weight loss and excellent lifestyle modifications, continue working on these. F/u as needed       Follow up plan: Return if symptoms worsen or fail to improve.

## 2017-07-20 NOTE — Patient Instructions (Signed)
Follow up as needed

## 2017-07-27 ENCOUNTER — Telehealth: Payer: Self-pay | Admitting: Family Medicine

## 2017-07-27 NOTE — Telephone Encounter (Signed)
Copied from CRM [phone]. Topic: Quick Communication - See Telephone Encounter >> Jul 27, 2017 12:07 PM Diana EvesHoyt, Maryann B wrote: CRM for notification. See Telephone encounter for:  Pt just finished antibiotic and not feeling better at all. She still has congestion and cough. She is wanting to know if another antibiotic and cough meds be called in.  07/27/17.

## 2017-07-28 ENCOUNTER — Ambulatory Visit (INDEPENDENT_AMBULATORY_CARE_PROVIDER_SITE_OTHER): Payer: Medicaid Other | Admitting: Family Medicine

## 2017-07-28 ENCOUNTER — Encounter: Payer: Self-pay | Admitting: Family Medicine

## 2017-07-28 ENCOUNTER — Telehealth: Payer: Self-pay | Admitting: Family Medicine

## 2017-07-28 DIAGNOSIS — J069 Acute upper respiratory infection, unspecified: Secondary | ICD-10-CM

## 2017-07-28 MED ORDER — HYDROCOD POLST-CPM POLST ER 10-8 MG/5ML PO SUER: 5.0000 mL | Freq: Two times a day (BID) | ORAL | 0 refills | Status: DC | PRN

## 2017-07-28 MED ORDER — CEFDINIR 300 MG PO CAPS: 300.0000 mg | ORAL_CAPSULE | Freq: Two times a day (BID) | ORAL | 0 refills | Status: DC

## 2017-07-28 NOTE — Progress Notes (Signed)
Pt came in for appt before she could be called letting her know that I was sending medicines over. Discussed in person the antibiotic change and to watch for allergic rxn given her past reactions to multiple abx. RXd tussionex since robitussin AC did not work as well. Discussed calling if not improving over the next few days and continuing mucinex.

## 2017-07-28 NOTE — Telephone Encounter (Signed)
Patient is at the pharmacy now and states the cough med is 50 and she cannot afford it.  Is there something cheaper you can send over.. Thanks  [facility] [place]

## 2017-07-28 NOTE — Patient Instructions (Signed)
Follow up if no better 

## 2017-07-28 NOTE — Telephone Encounter (Signed)
Will call in more medicine for her, OK to cancel appt later today. She can come pick up another script for cough syrup since it can't be faxed. Let her know given all of her allergies to antibiotics that she needs to be cautious when taking the cefdinir.

## 2017-07-28 NOTE — Telephone Encounter (Signed)
Roosvelt Maserachel Lane notified patient of below.

## 2017-07-28 NOTE — Telephone Encounter (Signed)
Called pt, reviewed options of new script for Robitussin [facility] or behind the counter tussin with codeine. Pt will either wait until she gets the money for the tussionex or get the behind the counter option

## 2017-07-30 ENCOUNTER — Ambulatory Visit: Payer: Self-pay | Admitting: *Deleted

## 2017-07-30 ENCOUNTER — Telehealth: Payer: Self-pay | Admitting: Family Medicine

## 2017-07-30 MED ORDER — CLARITHROMYCIN 250 MG PO TABS: 250.0000 mg | ORAL_TABLET | Freq: Two times a day (BID) | ORAL | 0 refills | Status: DC

## 2017-07-30 NOTE — Telephone Encounter (Signed)
Attempted to pt regarding symptoms; left message on voicemail [phone]; unable to complete nurse triage at this time.

## 2017-07-30 NOTE — Telephone Encounter (Signed)
Tried returning pt's call, left VM. Sent over another antibiotic that unfortunately is similar to what she was on previously but she has many antibiotic allergies so options are limited. Will try her back Monday

## 2017-07-30 NOTE — Telephone Encounter (Signed)
Called in c/o the 2nd antibiotic that Roosvelt Maserachel Lane called in for her because the Z Pak did not work, is making her lightheaded and nauseas.   Can a different antibiotic be called in?   She is having the same symptoms.  Thanks.  I routed a note to Spero Geraldsachel Lane's nurse pool making them aware of pt's request.

## 2017-07-30 NOTE — Addendum Note (Signed)
Addended by: Roosvelt MaserLANE, RACHEL E on: 07/30/2017 04:53 PM   Modules accepted: Orders

## 2017-08-02 NOTE — Telephone Encounter (Signed)
Called pt, she picked up the biaxin and is tolerating it well and starting to feel much better. Discussed continued use of humidifier, mucinex, and completing full course of abx. F/u if no improvement

## 2017-08-22 DIAGNOSIS — R112 Nausea with vomiting, unspecified: Secondary | ICD-10-CM | POA: Diagnosis not present

## 2017-08-27 ENCOUNTER — Telehealth: Payer: Self-pay | Admitting: Family Medicine

## 2017-08-27 MED ORDER — LIDOCAINE VISCOUS 2 % MT SOLN: 5.0000 mL | OROMUCOSAL | 0 refills | Status: DC | PRN

## 2017-08-27 NOTE — Telephone Encounter (Signed)
Sent over some lidocaine to help with the ulcer. Not sure if that's what she meant, I've never seen her for these so not sure what type of ulcers she's dealing with. Will need an appt for anything more than that for evaluation though

## 2017-08-27 NOTE — Telephone Encounter (Signed)
Patient notified

## 2017-08-27 NOTE — Telephone Encounter (Signed)
Copied from CRM [phone]. Topic: Quick Communication - See Telephone Encounter >> Aug 27, 2017  3:16 PM Lelon FrohlichGolden, Truth Barot, [place] wrote: CRM for notification. See Telephone encounter for:   08/27/17.pt would like a mouthwash called in to her pharmacy for an ulcer in her mouth Pt pharmacy [facility] [address] [place] Please call pt if this can be done [phone]

## 2017-10-12 ENCOUNTER — Emergency Department
Admission: EM | Admit: 2017-10-12 | Discharge: 2017-10-12 | Disposition: A | Discharge status: Home / Self Care | Payer: Medicaid Other | Attending: Student in an Organized Health Care Education/Training Program | Admitting: Student in an Organized Health Care Education/Training Program

## 2017-10-12 ENCOUNTER — Encounter: Payer: Self-pay | Admitting: Emergency Medicine

## 2017-10-12 ENCOUNTER — Ambulatory Visit: Payer: Medicaid Other | Admitting: Unknown Physician Specialty

## 2017-10-12 ENCOUNTER — Emergency Department: Payer: Medicaid Other

## 2017-10-12 DIAGNOSIS — Z79899 Other long term (current) drug therapy: Secondary | ICD-10-CM | POA: Insufficient documentation

## 2017-10-12 DIAGNOSIS — R1084 Generalized abdominal pain: Secondary | ICD-10-CM

## 2017-10-12 DIAGNOSIS — N3001 Acute cystitis with hematuria: Secondary | ICD-10-CM | POA: Insufficient documentation

## 2017-10-12 DIAGNOSIS — R140 Abdominal distension (gaseous): Secondary | ICD-10-CM | POA: Insufficient documentation

## 2017-10-12 LAB — URINALYSIS, COMPLETE (UACMP) WITH MICROSCOPIC
Bacteria, UA: NONE SEEN
Specific Gravity, Urine: 1.016 (ref 1.005–1.030)

## 2017-10-12 LAB — COMPREHENSIVE METABOLIC PANEL
ALT: 19 U/L (ref 14–54)
ANION GAP: 4 — AB (ref 5–15)
AST: 21 U/L (ref 15–41)
Albumin: 4.3 g/dL (ref 3.5–5.0)
Alkaline Phosphatase: 68 U/L (ref 38–126)
BUN: 18 mg/dL (ref 6–20)
CHLORIDE: 107 mmol/L (ref 101–111)
CO2: 26 mmol/L (ref 22–32)
Calcium: 9.1 mg/dL (ref 8.9–10.3)
Creatinine, Ser: 0.6 mg/dL (ref 0.44–1.00)
GFR calc non Af Amer: >60 mL/min (ref >60)
Glucose, Bld: 89 mg/dL (ref 65–99)
Potassium: 4.2 mmol/L (ref 3.5–5.1)
Sodium: 137 mmol/L (ref 135–145)
Total Bilirubin: 0.5 mg/dL (ref 0.3–1.2)
Total Protein: 6.9 g/dL (ref 6.5–8.1)

## 2017-10-12 LAB — CBC
HCT: 39.1 % (ref 35.0–47.0)
Hemoglobin: 13.6 g/dL (ref 12.0–16.0)
MCH: 29.9 pg (ref 26.0–34.0)
MCHC: 34.7 g/dL (ref 32.0–36.0)
MCV: 86.2 fL (ref 80.0–100.0)
Platelets: 288 10*3/uL (ref 150–440)
RBC: 4.54 MIL/uL (ref 3.80–5.20)
RDW: 14 % (ref 11.5–14.5)
WBC: 14.8 10*3/uL — AB (ref 3.6–11.0)

## 2017-10-12 LAB — LIPASE, BLOOD: LIPASE: 30 U/L (ref 11–51)

## 2017-10-12 LAB — POCT PREGNANCY, URINE: Preg Test, Ur: NEGATIVE

## 2017-10-12 MED ORDER — MORPHINE SULFATE (PF) 4 MG/ML IV SOLN
4.0000 mg | INTRAVENOUS | Status: DC | PRN
  Administered 2017-10-12: 4 mg via INTRAVENOUS
  Filled 2017-10-12: qty 1

## 2017-10-12 MED ORDER — CEPHALEXIN 500 MG PO CAPS: 500.0000 mg | ORAL_CAPSULE | Freq: Three times a day (TID) | ORAL | 0 refills | Status: DC

## 2017-10-12 MED ORDER — SODIUM CHLORIDE 0.9 % IV BOLUS
1000.0000 mL | Freq: Once | INTRAVENOUS | Status: AC
  Administered 2017-10-12: 1000 mL via INTRAVENOUS

## 2017-10-12 MED ORDER — PROMETHAZINE HCL 25 MG/ML IJ SOLN
12.5000 mg | Freq: Four times a day (QID) | INTRAMUSCULAR | Status: DC | PRN
  Administered 2017-10-12: 12.5 mg via INTRAVENOUS
  Filled 2017-10-12: qty 1

## 2017-10-12 MED ORDER — IOPAMIDOL (ISOVUE-300) INJECTION 61%
100.0000 mL | Freq: Once | INTRAVENOUS | Status: AC | PRN
  Administered 2017-10-12: 100 mL via INTRAVENOUS
  Filled 2017-10-12: qty 100

## 2017-10-12 NOTE — ED Provider Notes (Signed)
[facility] Emergency Department Provider Note    First MD Initiated Contact with Patient 10/12/17 1546     (approximate)  I have reviewed the triage vital signs and the nursing notes.   HISTORY  Chief Complaint Abdominal Cramping    HPI Alyssa Kemp is a 27 y.o. female with a history of scoliosis as well as bipolar depression presents to the ER with several days of left upper quadrant abdominal pain as well as bloating and abdominal distention.  Patient states that the past week or so she is having trouble fitting into her pants.  States that she is also having back pain and dysuria with increased urinary frequency.  Denies any chance of being pregnant.  No vaginal discharge or bleeding.  Denies any chest pain or shortness of breath.  Is never had pain like this before.  Past Medical History:  Diagnosis Date  . Bipolar depression (HCC)   . GERD (gastroesophageal reflux disease)   . Gestational diabetes   . History of macrosomia in infant in prior pregnancy, currently pregnant   . 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 Scoliosis    Family History  Problem Relation Age of Onset  . Cervical cancer Mother   . Ovarian cancer Mother   . Hypertension Maternal Grandmother   . Pancreatic cancer Maternal Grandmother   . Diabetes Maternal Grandmother   . Coronary artery disease Maternal Grandfather   . Congestive Heart Failure Maternal Grandfather   . Hypertension Maternal Grandfather   . Prostate cancer Maternal Grandfather   . Diabetes Maternal Grandfather   . Hypertension Paternal Grandmother   . Diabetes Paternal Grandmother   . Heart disease Paternal Grandfather   . Hypertension Paternal Grandfather   . Breast cancer Paternal Aunt    Past Surgical History:  Procedure Laterality Date  . ADENOIDECTOMY    . 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 TONSILLECTOMY    . TUBAL LIGATION Bilateral 09/19/2016   Procedure: POST PARTUM TUBAL LIGATION;   Surgeon: Conard NovakStephen D Jackson, MD;  Location: [facility] ORS;  Service: Gynecology;  Laterality: Bilateral;  . WISDOM TOOTH EXTRACTION     Patient Active Problem List   Diagnosis Date Noted  . Chlamydia 02/15/2017  . Encounter for sterilization 09/19/2016  . Normal labor and delivery 09/18/2016  . Labor and delivery indication for care or intervention 09/14/2016  . Gestational diabetes mellitus (GDM) affecting pregnancy, antepartum 09/05/2016  . High risk pregnancy, antepartum 09/05/2016  . Bipolar 1 disorder (HCC) 09/05/2016  . Rh negative state in antepartum period 09/05/2016  . GERD (gastroesophageal reflux disease) 08/28/2016  . 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 Anxiety, generalized 11/05/2015  . 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/05/2015  . History of chlamydia infection 11/05/2015  . History of abnormal Pap smear 02/21/2013      Prior to Admission medications   Medication Sig Start Date End Date Taking? Authorizing Provider  azithromycin (ZITHROMAX) 250 MG tablet Take 2 tabs day one, then 1 tab daily until complete 07/20/17   Particia NearingLane, Rachel Elizabeth, PA-C  cefdinir (OMNICEF) 300 MG capsule Take 1 capsule (300 mg total) by mouth 2 (two) times daily. 07/28/17   Particia NearingLane, Rachel Elizabeth, PA-C  chlorpheniramine-HYDROcodone [facility](TUSSIONEX PENNKINETIC ER) 10-8 MG/5ML SUER Take 5 mLs by mouth every 12 (twelve) hours as needed. 07/28/17   Particia NearingLane, Rachel Elizabeth, PA-C  clarithromycin (BIAXIN) 250 MG tablet Take 1 tablet (250 mg total) by mouth 2 (  two) times daily. 07/30/17   Particia Nearing, PA-C  escitalopram (LEXAPRO) 10 MG tablet TK 1 T PO QD 02/11/17   [provider]  guaiFENesin-codeine 100-10 MG/5ML syrup Take 10 mLs by mouth 3 (three) times daily as needed for cough. 07/20/17   Particia Nearing, PA-C  lidocaine (LIDODERM) 5 % Place 1 patch onto the skin daily. Remove & Discard patch within 12 hours or as directed by MD 07/02/17   Particia Nearing, PA-C  lidocaine (XYLOCAINE) 2 % solution Use as directed 5 mLs in the mouth or throat as needed. 08/27/17   Particia Nearing, PA-C  LORazepam (ATIVAN) 0.5 MG tablet TK 1 T PO BID FOR ANXIETY 01/07/17   [provider]  QUEtiapine (SEROQUEL) 50 MG tablet TK 1 T PO  QHS 02/11/17   [provider]  VYVANSE 20 MG capsule Take 20 mg by mouth every morning. 02/11/17   [provider]  propranolol ER (INDERAL LA) 60 MG 24 hr capsule Take 1 capsule (60 mg total) by mouth at bedtime. To decrease frequency/severity of headaches. 12/31/14 10/17/15  Isa Rankin, MD    Allergies Sulfa antibiotics; Sulfacetamide sodium; Sulfasalazine; Doxycycline; Cantaloupe (diagnostic); Amoxicillin-pot clavulanate; and Penicillins    Social History Social History   Tobacco Use  . Smoking status: Never Smoker  . Smokeless tobacco: Never Used  Substance Use Topics  . Alcohol use: No  . Drug use: No    Review of Systems Patient denies headaches, rhinorrhea, blurry vision, numbness, shortness of breath, chest pain, edema, cough, abdominal pain, nausea, vomiting, diarrhea, dysuria, fevers, rashes or hallucinations unless otherwise stated above in HPI. ____________________________________________   PHYSICAL EXAM:  VITAL SIGNS: Vitals:   10/12/17 1521  BP: 119/70  Pulse: 100  Resp: 20  Temp: 98.4 F (36.9 C)  SpO2: (!) 80%    Constitutional: Alert and oriented. Well appearing and in no acute distress. Eyes: Conjunctivae are normal.  Head: Atraumatic. Nose: No congestion/rhinnorhea. Mouth/Throat: Mucous membranes are moist.   Neck: No stridor. Painless ROM.  Cardiovascular: Normal rate, regular rhythm. Grossly normal heart sounds.  Good peripheral circulation. Respiratory: Normal respiratory effort.  No retractions. Lungs CTAB. Gastrointestinal: Soft with mild epigastric ttp. No distention. No abdominal bruits. No CVA tenderness. Genitourinary:    Musculoskeletal: No lower extremity tenderness nor edema.  No joint effusions. Neurologic:  Normal speech and language. No gross focal neurologic deficits are appreciated. No facial droop Skin:  Skin is warm, dry and intact. No rash noted. Psychiatric: Mood and affect are normal. Speech and behavior are normal.  ____________________________________________   LABS (all labs ordered are listed, but only abnormal results are displayed)  Results for orders placed or performed during the hospital encounter of 10/12/17 (from the past 24 hour(s))  CBC     Status: Abnormal   Collection Time: 10/12/17  3:25 PM  Result Value Ref Range   WBC 14.8 (H) 3.6 - 11.0 K/uL   RBC 4.54 3.80 - 5.20 MIL/uL   Hemoglobin 13.6 12.0 - 16.0 g/dL   HCT 78.2 95.6 - 21.3 %   MCV 86.2 80.0 - 100.0 fL   MCH 29.9 26.0 - 34.0 pg   MCHC 34.7 32.0 - 36.0 g/dL   RDW 08.6 57.8 - 46.9 %   Platelets 288 150 - 440 K/uL  Pregnancy, urine POC     Status: None   Collection Time: 10/12/17  3:29 PM  Result Value Ref Range   Preg Test, Ur NEGATIVE  NEGATIVE   ____________________________________________  EKG____________________________________________  RADIOLOGY  I personally reviewed all radiographic images ordered to evaluate for the above acute complaints and reviewed radiology reports and findings.  These findings were personally discussed with the patient.  Please see medical record for radiology report.  ____________________________________________   PROCEDURES  Procedure(s) performed:  Procedures    Critical Care performed: no ____________________________________________   INITIAL IMPRESSION / ASSESSMENT AND PLAN / ED COURSE  Pertinent labs & imaging results that were available during my care of the patient were reviewed by me and considered in my medical decision making (see chart for details).  DDX: pancreatitis, splenomegaly, gastritis, sbo, colitis, uti, pyelo  Alyssa Kemp is a 27 y.o. who  presents to the ED with symptoms as described above.  CT imaging ordered for the above differential.  CT imaging is reassuring shows no evidence of acute intra-abdominal process.  Does have probable acute cystitis possible [place].  Patient otherwise well-appearing.  Tolerating oral hydration and ambulating with a steady gait.  Patient will be given prescription for antibiotics to treat for acute cystitis.  Discussed signs and symptoms for which the patient should return immediately to the hospital.      As part of my medical decision making, I reviewed the following data within the electronic MEDICAL RECORD NUMBER Nursing notes reviewed and incorporated, Labs reviewed, notes from prior ED visits .   ____________________________________________   FINAL CLINICAL IMPRESSION(S) / ED DIAGNOSES  Final diagnoses:  Generalized abdominal pain  Bloating  Acute cystitis with hematuria      NEW MEDICATIONS STARTED DURING THIS VISIT:  New Prescriptions   No medications on file     Note:  This document was prepared using Dragon voice recognition software and may include unintentional dictation errors.    Willy Eddy, MD 10/12/17 [phone]

## 2017-10-12 NOTE — ED Notes (Signed)
FIRST NURSE NOTE: Pt reports [facility] sent pt over for CT scan of pancreas because she was having bloating in her abdomen. Pt ambulatory to triage desk.

## 2017-10-12 NOTE — ED Triage Notes (Addendum)
Pt reports sent from [facility] for CT scan due to abdominal bloating and lower back pain. Pt reports UTI sx for 2 days. Unsure if possibly pregnant, irregular periods. Pt reports abdomen has become so bloated in the past 6 days that she isn't able to fit in her pants.

## 2017-10-12 NOTE — Discharge Instructions (Signed)

## 2017-10-12 NOTE — ED Notes (Signed)
Pt c/o having abd bloating/distention with LUQ pain since [facility]. Denies N/V/D..Marland Kitchen

## 2017-10-19 ENCOUNTER — Encounter: Payer: Self-pay | Admitting: Family Medicine

## 2017-10-19 ENCOUNTER — Ambulatory Visit: Payer: Medicaid Other | Admitting: Family Medicine

## 2017-10-19 VITALS — BP 123/73 | HR 76 | Temp 98.1°F | Wt 181.7 lb

## 2017-10-19 DIAGNOSIS — K219 Gastro-esophageal reflux disease without esophagitis: Secondary | ICD-10-CM | POA: Diagnosis not present

## 2017-10-19 DIAGNOSIS — N760 Acute vaginitis: Secondary | ICD-10-CM | POA: Diagnosis not present

## 2017-10-19 DIAGNOSIS — G8929 Other chronic pain: Secondary | ICD-10-CM

## 2017-10-19 DIAGNOSIS — N3090 Cystitis, unspecified without hematuria: Secondary | ICD-10-CM | POA: Diagnosis not present

## 2017-10-19 DIAGNOSIS — B9689 Other specified bacterial agents as the cause of diseases classified elsewhere: Secondary | ICD-10-CM

## 2017-10-19 DIAGNOSIS — M5441 Lumbago with sciatica, right side: Secondary | ICD-10-CM

## 2017-10-19 LAB — UA/M W/RFLX CULTURE, ROUTINE
Bilirubin, UA: NEGATIVE
GLUCOSE, UA: NEGATIVE
Ketones, UA: NEGATIVE
Nitrite, UA: NEGATIVE
Specific Gravity, UA: >1.03 — ABNORMAL HIGH (ref 1.005–1.030)
UUROB: 0.2 mg/dL (ref 0.2–1.0)
pH, UA: 5.5 (ref 5.0–7.5)

## 2017-10-19 LAB — MICROSCOPIC EXAMINATION: Epithelial Cells (non renal): >10 /hpf — AB (ref 0–10)

## 2017-10-19 LAB — WET PREP FOR TRICH, YEAST, CLUE
CLUE CELL EXAM: POSITIVE — AB
Trichomonas Exam: NEGATIVE
YEAST EXAM: NEGATIVE

## 2017-10-19 MED ORDER — METRONIDAZOLE 500 MG PO TABS: 500.0000 mg | ORAL_TABLET | Freq: Two times a day (BID) | ORAL | 0 refills | Status: DC

## 2017-10-19 MED ORDER — NITROFURANTOIN MONOHYD MACRO 100 MG PO CAPS: 100.0000 mg | ORAL_CAPSULE | Freq: Two times a day (BID) | ORAL | 0 refills | Status: DC

## 2017-10-19 MED ORDER — OMEPRAZOLE 20 MG PO CPDR: 20.0000 mg | DELAYED_RELEASE_CAPSULE | Freq: Every day | ORAL | 3 refills | Status: DC

## 2017-10-19 MED ORDER — FLUCONAZOLE 150 MG PO TABS: 150.0000 mg | ORAL_TABLET | Freq: Once | ORAL | 0 refills | Status: AC

## 2017-10-19 NOTE — Progress Notes (Signed)
BP 123/73 (BP Location: Left Arm, Patient Position: Sitting, Cuff Size: Normal)   Pulse 76   Temp 98.1 F (36.7 C) (Oral)   Wt 181 lb 11.2 oz (82.4 kg)   SpO2 100%   BMI 31.19 kg/m    Subjective:    [name], female    DOB: November 30, 1990, 27 y.o.   MRN: [id number]  HPI: Alyssa Kemp is a 27 y.o. female  Chief Complaint  Patient presents with  . Urinary Tract Infection    Had UTI but did not finish antibiotic due to breast feeding.  . Vaginal Itching    Now having vaginal discharge and itching.  . Vaginal Discharge   Was seen in the ER recently for flank pain, dysuria, abdominal cramping and bloating and dx'd with a UTI. Started on keflex but could not complete because she is still breastfeeding and states the medicine caused her daughter to have significant diarrhea and vomiting. Still having dysuria, thick clumpy discharge, vaginal itching and irritation. States boyfriend is now also having irritation. Denies fevers, N/V/D, vaginal lesions or rashes,concern for STI. Trying OTC monistat with no relief. Currently on menstrual period.   Also having acid reflux flare. Usually able to control with TUMs and occasional zantac but having daily sxs not well controlled with OTC measures the past few weeks. Has tried PPIs in the past with good relief.   Would like a second opinion from Neurosurgery for her chronic back pain as current specialist has not given her many options and she feels none of them are long-term. Had epidural injection about 3 weeks ago which may have helped some but was very painful and she does not wish to continue long term. Her pain is significantly interfering with her ability to take care of her children at this point, barely able to pick them up or do basic strenuous tasks around the house. States sciatica has been absent for several weeks but back pain is now b/l and almost constant.   Past Medical History:  Diagnosis Date  . Bipolar  depression (HCC)   . GERD (gastroesophageal reflux disease)   . Gestational diabetes   . History of macrosomia in infant in prior pregnancy, currently pregnant   . 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 Scoliosis    Social History   Socioeconomic History  . Marital status: Single    Spouse name: Not on file  . Number of children: Not on file  . Years of education: Not on file  . Highest education level: Not on file  Occupational History  . Not on file  Social Needs  . Financial resource strain: Not on file  . Food insecurity:    Worry: Not on file    Inability: Not on file  . Transportation needs:    Medical: Not on file    Non-medical: Not on file  Tobacco Use  . Smoking status: Never Smoker  . Smokeless tobacco: Never Used  Substance and Sexual Activity  . Alcohol use: No  . Drug use: No  . Sexual activity: Yes    Birth control/protection: None, Surgical  Lifestyle  . Physical activity:    Days per week: Not on file    Minutes per session: Not on file  . Stress: Not on file  Relationships  . Social connections:    Talks on phone: Not on file    Gets together: Not on file    Attends religious service: Not  on file    Active member of club or organization: Not on file    Attends meetings of clubs or organizations: Not on file    Relationship status: Not on file  . Intimate partner violence:    Fear of current or ex partner: Not on file    Emotionally abused: Not on file    Physically abused: Not on file    Forced sexual activity: Not on file  Other Topics Concern  . Not on file  Social History Narrative  . Not on file    Relevant past medical, surgical, family and social history reviewed and updated as indicated. Interim medical history since our last visit reviewed. Allergies and medications reviewed and updated.  Review of Systems  Per HPI unless specifically indicated above     Objective:    BP 123/73 (BP Location: Left Arm, Patient Position:  Sitting, Cuff Size: Normal)   Pulse 76   Temp 98.1 F (36.7 C) (Oral)   Wt 181 lb 11.2 oz (82.4 kg)   SpO2 100%   BMI 31.19 kg/m   Wt Readings from Last 3 Encounters:  10/19/17 181 lb 11.2 oz (82.4 kg)  10/12/17 182 lb (82.6 kg)  07/20/17 173 lb 14.4 oz (78.9 kg)    Physical Exam  Constitutional: She is oriented to person, place, and time. She appears well-developed and well-nourished. No distress.  HENT:  Head: Atraumatic.  Mouth/Throat: Oropharynx is clear and moist.  Eyes: Pupils are equal, round, and reactive to light. Conjunctivae are normal.  Neck: Normal range of motion. Neck supple.  Cardiovascular: Regular rhythm.  Pulmonary/Chest: Effort normal and breath sounds normal.  Abdominal: Soft. Bowel sounds are normal. There is no tenderness.  Genitourinary: Vaginal discharge (white, thick) found.  Musculoskeletal: Normal range of motion. She exhibits no tenderness (No CVA tenderness b/l).  Lymphadenopathy:    She has no cervical adenopathy.  Neurological: She is alert and oriented to person, place, and time.  Skin: Skin is warm and dry.  Psychiatric: She has a normal mood and affect. Her behavior is normal.  Nursing note and vitals reviewed.   Results for orders placed or performed in visit on 10/19/17  WET PREP FOR TRICH, YEAST, CLUE  Result Value Ref Range   Trichomonas Exam Negative Negative   Yeast Exam Negative Negative   Clue Cell Exam Positive (A) Negative  Microscopic Examination  Result Value Ref Range   WBC, UA 0-5 0 - 5 /hpf   RBC, UA 3-10 (A) 0 - 2 /hpf   Epithelial Cells (non renal) >10 (A) 0 - 10 /hpf   Mucus, UA Present Not Estab.   Bacteria, UA Few None seen/Few  UA/M w/rflx Culture, Routine  Result Value Ref Range   Specific Gravity, UA >1.030 (H) 1.005 - 1.030   pH, UA 5.5 5.0 - 7.5   Color, UA Brown (A) Yellow   Appearance Ur Cloudy (A) Clear   Leukocytes, UA Trace (A) Negative   Protein, UA 2+ (A) Negative/Trace   Glucose, UA Negative  Negative   Ketones, UA Negative Negative   RBC, UA 3+ (A) Negative   Bilirubin, UA Negative Negative   Urobilinogen, Ur 0.2 0.2 - 1.0 mg/dL   Nitrite, UA Negative Negative   Microscopic Examination See below:       Assessment & Plan:   Problem List Items Addressed This Visit      Digestive   GERD (gastroesophageal reflux disease)    Will restart prilosec and monitor  for benefit. Lifestyle changes reviewed      Relevant Medications   omeprazole (PRILOSEC) 20 MG capsule     Nervous and Auditory   Chronic low back pain with right-sided sciatica    Will refer for second opinion with Neurosurgery. Continue current plan - injections, stretches, home exercises, massage      Relevant Medications   VRAYLAR capsule   Other Relevant Orders   Ambulatory referral to Neurosurgery    Other Visit Diagnoses    Cystitis    -  Primary   Repeat U/A today is improved, but still with some bacteria. Since she didn't finish course previously, will start macrobid and monitor for improvement   Relevant Orders   UA/M w/rflx Culture, Routine (Completed)   BV (bacterial vaginosis)       Positive wet prep. Will tx with flagyl, probiotics, good vaginal hygiene. Avoid sexual contact until tx completed and sxs resolve   Relevant Medications   metroNIDAZOLE (FLAGYL) 500 MG tablet   Other Relevant Orders   WET PREP FOR TRICH, YEAST, CLUE (Completed)       Follow up plan: Return for as scheduled.

## 2017-10-22 NOTE — Assessment & Plan Note (Signed)
Will restart prilosec and monitor for benefit. Lifestyle changes reviewed

## 2017-10-22 NOTE — Assessment & Plan Note (Signed)
Will refer for second opinion with Neurosurgery. Continue current plan - injections, stretches, home exercises, massage

## 2017-10-22 NOTE — Patient Instructions (Signed)
Follow up as needed

## 2017-11-01 ENCOUNTER — Telehealth: Payer: Self-pay | Admitting: Family Medicine

## 2017-11-01 ENCOUNTER — Encounter: Payer: Self-pay | Admitting: Family Medicine

## 2017-11-01 DIAGNOSIS — R300 Dysuria: Secondary | ICD-10-CM

## 2017-11-01 NOTE — Telephone Encounter (Signed)
Patient will come in and drop off urine.

## 2017-11-01 NOTE — Telephone Encounter (Signed)
Copied from CRM [id number]. Topic: General - Other >> Nov 01, 2017 10:15 AM Raquel Sarna wrote: Pt took all of her antibiotic for her UTI.  The symptoms came back the next day. Pt is wondering if another stronger one can be called in for her. Please call pt back to let her know.

## 2017-11-01 NOTE — Telephone Encounter (Signed)
Needs to at least come in and leave a urine, if not an appointment to see if she has a UTI. Order in.

## 2017-11-02 ENCOUNTER — Other Ambulatory Visit: Payer: Medicaid Other

## 2017-11-02 ENCOUNTER — Telehealth: Payer: Self-pay | Admitting: Family Medicine

## 2017-11-02 DIAGNOSIS — R319 Hematuria, unspecified: Secondary | ICD-10-CM

## 2017-11-02 DIAGNOSIS — R300 Dysuria: Secondary | ICD-10-CM

## 2017-11-02 LAB — UA/M W/RFLX CULTURE, ROUTINE
Bilirubin, UA: NEGATIVE
Glucose, UA: NEGATIVE
Ketones, UA: NEGATIVE
Leukocytes, UA: NEGATIVE
NITRITE UA: NEGATIVE
PH UA: 5.5 (ref 5.0–7.5)
Protein, UA: NEGATIVE
Specific Gravity, UA: 1.025 (ref 1.005–1.030)
Urobilinogen, Ur: 0.2 mg/dL (ref 0.2–1.0)

## 2017-11-02 LAB — MICROSCOPIC EXAMINATION: Epithelial Cells (non renal): >10 /hpf — AB (ref 0–10)

## 2017-11-02 NOTE — Telephone Encounter (Signed)
Please let her know that her urine had a lot of blood in it, but no sign of a UTI. If she is not on her period right now, I'll refer her to urology to get this worked up. No need for another antibiotic right now as it doesn't look like she has an infection.

## 2017-11-02 NOTE — Telephone Encounter (Signed)
Referral to urology made today

## 2017-11-02 NOTE — Telephone Encounter (Signed)
Patient states that she is not currently on her period

## 2017-11-02 NOTE — Telephone Encounter (Signed)
Routing to provider  

## 2017-11-02 NOTE — Telephone Encounter (Signed)
Copied from CRM #[id number]. Topic: Quick Communication - See Telephone Encounter >> Nov 02, 2017  1:34 PM Terisa Starr wrote: CRM for notification. See Telephone encounter for: 11/02/17.  Patient is wanting to know if the results are back from her urine sample this morning Call back @ [phone]

## 2017-11-26 ENCOUNTER — Ambulatory Visit: Payer: Medicaid Other

## 2017-12-05 NOTE — H&P (View-Only) (Signed)
12/08/2017 12:04 PM   Alyssa Kemp 1990/08/20 [id number]  Referring provider: Particia Nearing, PA-C [address] [place], [place] [address]  No chief complaint on file.   HPI: Patient is a 27 -year-old Caucasian female who presents today as a referral from Dr. Olevia Perches for microscopic hematuria.    Patient was found to have microscopic hematuria on 10/19/2017 and 11/02/2017 with 3-10 RBC's/hpf.     She does have a prior history of recurrent urinary tract infections with her pregnancy.  She was on Macrobid the almost the entire pregnancy.  She does not have a history of nephrolithiasis, trauma to the genitourinary tract or malignancies of the genitourinary tract.   She does not have a family medical history of nephrolithiasis, malignancies of the genitourinary tract or hematuria.  She was not raised by her parents.  She states her grandfather had prostate cancer.    Today, she is having intermittent sensitivity when she urinates.  This has been occurring since her pregnancy.   Her UA today demonstrates 3-10 RBC's and moderate bacteria.    Patient denies any gross hematuria, dysuria or suprapubic/flank pain.  Patient denies any fevers, chills, nausea or vomiting.    CT Renal stone study performed in 03/2017 noted adrenals appear normal bilaterally. Kidneys bilaterally show no appreciable mass or hydronephrosis on either side. There is no renal or ureteral calculus on either side. Urinary bladder is midline. Urinary bladder wall appears thickened in spite of significant decompression of the urinary bladder.  Contrast CT in 09/2017 noted adrenal glands are unremarkable. Kidneys are normal, without renal calculi, focal lesion, or hydronephrosis.  Bladder is unremarkable.  She is not a smoker. She is not exposed to secondhand smoke.  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     She has a high BMI.    She does not drink water.  She drinks milk and  [employer] and Dr. Pat Kocher.    PMH: Past Medical History:  Diagnosis Date  . Bipolar depression (HCC)   . GERD (gastroesophageal reflux disease)   . Gestational diabetes   . History of macrosomia in infant in prior pregnancy, currently pregnant   . 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 Scoliosis     Surgical History: Past Surgical History:  Procedure Laterality Date  . ADENOIDECTOMY    . 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 TONSILLECTOMY    . TUBAL LIGATION Bilateral 09/19/2016   Procedure: POST PARTUM TUBAL LIGATION;  Surgeon: [name], MD;  Location: [facility];  Service: Gynecology;  Laterality: Bilateral;  . WISDOM TOOTH EXTRACTION      Home Medications:  Allergies as of 12/08/2017      Reactions   Sulfa Antibiotics Shortness Of Breath, Other (See Comments)   Other Reaction: OTHER REACTION   Sulfacetamide Sodium Shortness Of Breath   Sulfasalazine Shortness Of Breath   Doxycycline Swelling   Cantaloupe (diagnostic) Swelling   Amoxicillin-pot Clavulanate Other (See Comments)   Penicillins Other (See Comments)   Other reaction(s): Other (See Comments)      Medication List        Accurate as of 12/08/17 12:04 PM. Always use your most recent med list.          metroNIDAZOLE 500 MG tablet Commonly known as:  FLAGYL Take 1 tablet (500 mg total) by mouth 2 (two) times daily.   nitrofurantoin (macrocrystal-monohydrate) 100 MG capsule Commonly known as:  MACROBID Take  1 capsule (100 mg total) by mouth 2 (two) times daily.   omeprazole 20 MG capsule Commonly known as:  PRILOSEC Take 1 capsule (20 mg total) by mouth daily.   VRAYLAR capsule Generic drug:  cariprazine TK 1 C PO HS   VYVANSE 20 MG capsule Generic drug:  lisdexamfetamine Take 20 mg by mouth every morning.       Allergies:  Allergies  Allergen Reactions  . Sulfa Antibiotics Shortness Of Breath and Other (See Comments)    Other Reaction: OTHER REACTION  . Sulfacetamide Sodium  Shortness Of Breath  . Sulfasalazine Shortness Of Breath  . Doxycycline Swelling  . Cantaloupe (Diagnostic) Swelling  . Amoxicillin-Pot Clavulanate Other (See Comments)  . Penicillins Other (See Comments)    Other reaction(s): Other (See Comments)    Family History: Family History  Problem Relation Age of Onset  . Cervical cancer Mother   . Ovarian cancer Mother   . Hypertension Maternal Grandmother   . Pancreatic cancer Maternal Grandmother   . Diabetes Maternal Grandmother   . Coronary artery disease Maternal Grandfather   . Congestive Heart Failure Maternal Grandfather   . Hypertension Maternal Grandfather   . Prostate cancer Maternal Grandfather   . Diabetes Maternal Grandfather   . Hypertension Paternal Grandmother   . Diabetes Paternal Grandmother   . Heart disease Paternal Grandfather   . Hypertension Paternal Grandfather   . Breast cancer Paternal Aunt     Social History:  reports that she has never smoked. She has never used smokeless tobacco. She reports that she does not drink alcohol or use drugs.  ROS: UROLOGY Frequent Urination?: No Hard to postpone urination?: No Burning/pain with urination?: Yes Get up at night to urinate?: No Leakage of urine?: No Urine stream starts and stops?: No Trouble starting stream?: No Do you have to strain to urinate?: No Blood in urine?: Yes Urinary tract infection?: No Sexually transmitted disease?: No Injury to kidneys or bladder?: No Painful intercourse?: No Weak stream?: No Currently pregnant?: No Vaginal bleeding?: No  Gastrointestinal Nausea?: Yes Vomiting?: Yes Indigestion/heartburn?: Yes Diarrhea?: No Constipation?: No  Constitutional Fever: No Night sweats?: No Weight loss?: No Fatigue?: Yes  Skin Skin rash/lesions?: No Itching?: No  Eyes Blurred vision?: No Double vision?: No  Ears/Nose/Throat Sore throat?: No Sinus problems?: No  Hematologic/Lymphatic Swollen glands?: No Easy bruising?:  No  Cardiovascular Leg swelling?: No Chest pain?: No  Respiratory Cough?: No Shortness of breath?: No  Endocrine Excessive thirst?: Yes  Musculoskeletal Back pain?: Yes Joint pain?: No  Neurological Headaches?: Yes Dizziness?: No  Psychologic Depression?: Yes Anxiety?: Yes  Physical Exam: BP 107/69   Pulse 82   Resp 16   Ht 5\' 4"  (1.626 m)   Wt 180 lb (81.6 kg)   SpO2 96%   BMI 30.90 kg/m   Constitutional:  Well nourished. Alert and oriented, No acute distress. HEENT: [place] AT, moist mucus membranes.  Trachea midline, no masses. Cardiovascular: No clubbing, cyanosis, or edema. Respiratory: Normal respiratory effort, no increased work of breathing. GI: Abdomen is soft, non tender, non distended, no abdominal masses. Liver and spleen not palpable.  No hernias appreciated.  Stool sample for occult testing is not indicated.   GU: No CVA tenderness.  No bladder fullness or masses.  Normal external genitalia, normal pubic hair distribution, no lesions.  Normal urethral meatus, no lesions, no prolapse, no discharge.   No urethral masses, tenderness and/or tenderness. No bladder fullness, tenderness or masses. Normal vagina mucosa, good estrogen  effect, no discharge, no lesions, good pelvic support, no cystocele or rectocele noted.  No cervical motion tenderness.  Uterus is freely mobile and non-fixed.  No adnexal/parametria masses or tenderness noted.  Anus and perineum are without rashes or lesions.    Skin: No rashes, bruises or suspicious lesions. Lymph: No cervical or inguinal adenopathy. Neurologic: Grossly intact, no focal deficits, moving all 4 extremities. Psychiatric: Normal mood and affect.  Laboratory Data: Lab Results  Component Value Date   WBC 14.8 (H) 10/12/2017   HGB 13.6 10/12/2017   HCT 39.1 10/12/2017   MCV 86.2 10/12/2017   PLT 288 10/12/2017    Lab Results  Component Value Date   CREATININE 0.60 10/12/2017    No results found for: PSA  No  results found for: TESTOSTERONE  No results found for: HGBA1C  Lab Results  Component Value Date   TSH 0.629 07/02/2017    No results found for: CHOL, HDL, CHOLHDL, VLDL, LDLCALC  Lab Results  Component Value Date   AST 21 10/12/2017   Lab Results  Component Value Date   ALT 19 10/12/2017   No components found for: ALKALINEPHOPHATASE No components found for: BILIRUBINTOTAL  No results found for: ESTRADIOL   Urinalysis 3-10 RBC's.  Moderate bacteria.  See epic.   Pertinent Imaging: CLINICAL DATA:  Dysuria  EXAM: CT ABDOMEN AND PELVIS WITHOUT CONTRAST  TECHNIQUE: Multidetector CT imaging of the abdomen and pelvis was performed following the standard protocol without oral or intravenous contrast material administration.  COMPARISON:  December 17, 2005  FINDINGS: Lower chest: Lung bases are clear.  Hepatobiliary: No focal liver lesions are evident on this noncontrast enhanced study. Gallbladder wall is not appreciably thickened. There is no biliary duct dilatation.  Pancreas: There is no pancreatic mass or inflammatory focus.  Spleen: No splenic lesions evident.  Adrenals/Urinary Tract: Adrenals appear normal bilaterally. Kidneys bilaterally show no appreciable mass or hydronephrosis on either side. There is no renal or ureteral calculus on either side. Urinary bladder is midline. Urinary bladder wall appears thickened in spite of significant decompression of the urinary bladder.  Stomach/Bowel: There is no appreciable small or large bowel wall or mesenteric thickening. Stomach wall appears somewhat thickened. Note that no attempt to distend stomach was made. There is no appreciable bowel obstruction. No free air or portal venous air.  Vascular/Lymphatic: There is no abdominal aortic aneurysm. No vascular lesions are evident on this noncontrast enhanced study. There is no adenopathy in the abdomen or pelvis by size criteria. There are scattered  subcentimeter lymph nodes in the mesentery, a finding regarded as nonspecific.  Reproductive: Uterus is directed somewhat superiorly. There is no evident pelvic mass.  Other: Appendix is not appreciable. There is no periappendiceal region inflammation. There is no ascites or abscess in the abdomen or pelvis. There is a minimal ventral hernia containing only fat.  Musculoskeletal: There is degenerative change at L5-S1 with disc narrowing at this level. 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 There are no blastic or lytic bone lesions. No intramuscular or abdominal wall lesion evident.  IMPRESSION: 1. Urinary bladder wall is felt to be thickened. Suspect a degree of cystitis.  2. Stomach wall appears somewhat thickened, although no attempt has been made to distend the stomach. A degree of gastritis cannot be excluded. Bowel wall elsewhere appears unremarkable. No bowel obstruction. No free air or portal venous air.  3.  No renal or ureteral calculus.  No hydronephrosis.  4.  No abscess.  No  periappendiceal region inflammation.  5.  Subcentimeter mesenteric lymph nodes, regarded as nonspecific.  6.  Minimal ventral hernia containing only fat.  7. Relatively mild osteitis condensans ilia bilaterally, a finding of questionable clinical significance.   Electronically Signed   By: Bretta Bang III M.D.   On: 04/20/2017 14:39    CLINICAL DATA:  27 y/o F; abdominal bloating and lower back pain. UTI symptoms for 2 days.  EXAM: CT ABDOMEN AND PELVIS WITH CONTRAST  TECHNIQUE: Multidetector CT imaging of the abdomen and pelvis was performed using the standard protocol following bolus administration of intravenous contrast.  CONTRAST:  ISOVUE-300 IOPAMIDOL (ISOVUE-300) INJECTION 61%  COMPARISON:  04/20/2017 CT abdomen and pelvis.  FINDINGS: Lower chest: No acute abnormality.  Hepatobiliary: No focal liver abnormality is seen. No  gallstones, gallbladder wall thickening, or biliary dilatation.  Pancreas: Unremarkable. No pancreatic ductal dilatation or surrounding inflammatory changes.  Spleen: Normal in size without focal abnormality.  Adrenals/Urinary Tract: Adrenal glands are unremarkable. Kidneys are normal, without renal calculi, focal lesion, or hydronephrosis. Bladder is unremarkable.  Stomach/Bowel: Stomach is within normal limits. Appendix appears normal. No evidence of bowel wall thickening, distention, or inflammatory changes.  Vascular/Lymphatic: No significant vascular findings are present. No enlarged abdominal or pelvic lymph nodes.  Reproductive: Uterus and bilateral adnexa are unremarkable.  Other: No abdominal wall hernia or abnormality. No abdominopelvic ascites.  Musculoskeletal: No fracture is seen. Stable discogenic degenerative changes at the L5-S1 level. Mild osteitis condensans ilii.  IMPRESSION: 1. No acute process identified. 2. 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   Electronically Signed   By: Mitzi Hansen M.D.   On: 10/12/2017 16:32  I have independently reviewed the films  Assessment & Plan:    1. Microscopic hematuria Explained to the patient that there are a number of causes that can be associated with blood in the urine, such as stones,  UTI's, damage to the urinary tract and/or cancer. At this time, I felt that the patient warranted further urologic evaluation.   The AUA guidelines state that a CT urogram is the preferred imaging study to evaluate hematuria - patient is breast feeding and has undergone a non contrast and a contrast CT recently Her reproductive status is tubal ligation Explained to the patient that the imaging studies performed on 03/2017 and Nov 08, 2017 are not the recommended studies by the AUA for the work-up of blood in the urine.    The imaging studies that were performed lack the  detail of excluding some urological tumors. The recommended study is a CT urogram.  This study does require the use of contrast material.    At this time, he/she may choose to forego the appropriate study with the understanding that they are risking a missed diagnosis of a urological cancer and proceed with in office cystoscopy or undergo cystoscopy with RTG's in the OR - she has opted to undergo cystoscopy in the OR with RTG's I also explained the risks of general anesthesia, such as: MI, CVA, paralysis, coma and/or death.  UA was positive for 3-10 RBC's Urine culture  BUN + creatinine    Return for cystoscopy with bilateral RTG's .  These notes generated with voice recognition software. I apologize for typographical errors.  Michiel Cowboy, PA-C  [facility] [address]  [place], [place] [address] [phone]

## 2017-12-05 NOTE — Progress Notes (Signed)
12/08/2017 12:04 PM   Alyssa Kemp 1990/08/20 [id number]  Referring provider: Particia Nearing, PA-C [address] [place], [place] [address]  No chief complaint on file.   HPI: Patient is a 27 -year-old Caucasian female who presents today as a referral from Dr. Olevia Perches for microscopic hematuria.    Patient was found to have microscopic hematuria on 10/19/2017 and 11/02/2017 with 3-10 RBC's/hpf.     She does have a prior history of recurrent urinary tract infections with her pregnancy.  She was on Macrobid the almost the entire pregnancy.  She does not have a history of nephrolithiasis, trauma to the genitourinary tract or malignancies of the genitourinary tract.   She does not have a family medical history of nephrolithiasis, malignancies of the genitourinary tract or hematuria.  She was not raised by her parents.  She states her grandfather had prostate cancer.    Today, she is having intermittent sensitivity when she urinates.  This has been occurring since her pregnancy.   Her UA today demonstrates 3-10 RBC's and moderate bacteria.    Patient denies any gross hematuria, dysuria or suprapubic/flank pain.  Patient denies any fevers, chills, nausea or vomiting.    CT Renal stone study performed in 03/2017 noted adrenals appear normal bilaterally. Kidneys bilaterally show no appreciable mass or hydronephrosis on either side. There is no renal or ureteral calculus on either side. Urinary bladder is midline. Urinary bladder wall appears thickened in spite of significant decompression of the urinary bladder.  Contrast CT in 09/2017 noted adrenal glands are unremarkable. Kidneys are normal, without renal calculi, focal lesion, or hydronephrosis.  Bladder is unremarkable.  She is not a smoker. She is not exposed to secondhand smoke.  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     She has a high BMI.    She does not drink water.  She drinks milk and  [employer] and Dr. Pat Kocher.    PMH: Past Medical History:  Diagnosis Date  . Bipolar depression (HCC)   . GERD (gastroesophageal reflux disease)   . Gestational diabetes   . History of macrosomia in infant in prior pregnancy, currently pregnant   . 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 Scoliosis     Surgical History: Past Surgical History:  Procedure Laterality Date  . ADENOIDECTOMY    . 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 TONSILLECTOMY    . TUBAL LIGATION Bilateral 09/19/2016   Procedure: POST PARTUM TUBAL LIGATION;  Surgeon: [name], MD;  Location: [facility];  Service: Gynecology;  Laterality: Bilateral;  . WISDOM TOOTH EXTRACTION      Home Medications:  Allergies as of 12/08/2017      Reactions   Sulfa Antibiotics Shortness Of Breath, Other (See Comments)   Other Reaction: OTHER REACTION   Sulfacetamide Sodium Shortness Of Breath   Sulfasalazine Shortness Of Breath   Doxycycline Swelling   Cantaloupe (diagnostic) Swelling   Amoxicillin-pot Clavulanate Other (See Comments)   Penicillins Other (See Comments)   Other reaction(s): Other (See Comments)      Medication List        Accurate as of 12/08/17 12:04 PM. Always use your most recent med list.          metroNIDAZOLE 500 MG tablet Commonly known as:  FLAGYL Take 1 tablet (500 mg total) by mouth 2 (two) times daily.   nitrofurantoin (macrocrystal-monohydrate) 100 MG capsule Commonly known as:  MACROBID Take  1 capsule (100 mg total) by mouth 2 (two) times daily.   omeprazole 20 MG capsule Commonly known as:  PRILOSEC Take 1 capsule (20 mg total) by mouth daily.   VRAYLAR capsule Generic drug:  cariprazine TK 1 C PO HS   VYVANSE 20 MG capsule Generic drug:  lisdexamfetamine Take 20 mg by mouth every morning.       Allergies:  Allergies  Allergen Reactions  . Sulfa Antibiotics Shortness Of Breath and Other (See Comments)    Other Reaction: OTHER REACTION  . Sulfacetamide Sodium  Shortness Of Breath  . Sulfasalazine Shortness Of Breath  . Doxycycline Swelling  . Cantaloupe (Diagnostic) Swelling  . Amoxicillin-Pot Clavulanate Other (See Comments)  . Penicillins Other (See Comments)    Other reaction(s): Other (See Comments)    Family History: Family History  Problem Relation Age of Onset  . Cervical cancer Mother   . Ovarian cancer Mother   . Hypertension Maternal Grandmother   . Pancreatic cancer Maternal Grandmother   . Diabetes Maternal Grandmother   . Coronary artery disease Maternal Grandfather   . Congestive Heart Failure Maternal Grandfather   . Hypertension Maternal Grandfather   . Prostate cancer Maternal Grandfather   . Diabetes Maternal Grandfather   . Hypertension Paternal Grandmother   . Diabetes Paternal Grandmother   . Heart disease Paternal Grandfather   . Hypertension Paternal Grandfather   . Breast cancer Paternal Aunt     Social History:  reports that she has never smoked. She has never used smokeless tobacco. She reports that she does not drink alcohol or use drugs.  ROS: UROLOGY Frequent Urination?: No Hard to postpone urination?: No Burning/pain with urination?: Yes Get up at night to urinate?: No Leakage of urine?: No Urine stream starts and stops?: No Trouble starting stream?: No Do you have to strain to urinate?: No Blood in urine?: Yes Urinary tract infection?: No Sexually transmitted disease?: No Injury to kidneys or bladder?: No Painful intercourse?: No Weak stream?: No Currently pregnant?: No Vaginal bleeding?: No  Gastrointestinal Nausea?: Yes Vomiting?: Yes Indigestion/heartburn?: Yes Diarrhea?: No Constipation?: No  Constitutional Fever: No Night sweats?: No Weight loss?: No Fatigue?: Yes  Skin Skin rash/lesions?: No Itching?: No  Eyes Blurred vision?: No Double vision?: No  Ears/Nose/Throat Sore throat?: No Sinus problems?: No  Hematologic/Lymphatic Swollen glands?: No Easy bruising?:  No  Cardiovascular Leg swelling?: No Chest pain?: No  Respiratory Cough?: No Shortness of breath?: No  Endocrine Excessive thirst?: Yes  Musculoskeletal Back pain?: Yes Joint pain?: No  Neurological Headaches?: Yes Dizziness?: No  Psychologic Depression?: Yes Anxiety?: Yes  Physical Exam: BP 107/69   Pulse 82   Resp 16   Ht 5\' 4"  (1.626 m)   Wt 180 lb (81.6 kg)   SpO2 96%   BMI 30.90 kg/m   Constitutional:  Well nourished. Alert and oriented, No acute distress. HEENT: [place] AT, moist mucus membranes.  Trachea midline, no masses. Cardiovascular: No clubbing, cyanosis, or edema. Respiratory: Normal respiratory effort, no increased work of breathing. GI: Abdomen is soft, non tender, non distended, no abdominal masses. Liver and spleen not palpable.  No hernias appreciated.  Stool sample for occult testing is not indicated.   GU: No CVA tenderness.  No bladder fullness or masses.  Normal external genitalia, normal pubic hair distribution, no lesions.  Normal urethral meatus, no lesions, no prolapse, no discharge.   No urethral masses, tenderness and/or tenderness. No bladder fullness, tenderness or masses. Normal vagina mucosa, good estrogen  effect, no discharge, no lesions, good pelvic support, no cystocele or rectocele noted.  No cervical motion tenderness.  Uterus is freely mobile and non-fixed.  No adnexal/parametria masses or tenderness noted.  Anus and perineum are without rashes or lesions.    Skin: No rashes, bruises or suspicious lesions. Lymph: No cervical or inguinal adenopathy. Neurologic: Grossly intact, no focal deficits, moving all 4 extremities. Psychiatric: Normal mood and affect.  Laboratory Data: Lab Results  Component Value Date   WBC 14.8 (H) 10/12/2017   HGB 13.6 10/12/2017   HCT 39.1 10/12/2017   MCV 86.2 10/12/2017   PLT 288 10/12/2017    Lab Results  Component Value Date   CREATININE 0.60 10/12/2017    No results found for: PSA  No  results found for: TESTOSTERONE  No results found for: HGBA1C  Lab Results  Component Value Date   TSH 0.629 07/02/2017    No results found for: CHOL, HDL, CHOLHDL, VLDL, LDLCALC  Lab Results  Component Value Date   AST 21 10/12/2017   Lab Results  Component Value Date   ALT 19 10/12/2017   No components found for: ALKALINEPHOPHATASE No components found for: BILIRUBINTOTAL  No results found for: ESTRADIOL   Urinalysis 3-10 RBC's.  Moderate bacteria.  See epic.   Pertinent Imaging: CLINICAL DATA:  Dysuria  EXAM: CT ABDOMEN AND PELVIS WITHOUT CONTRAST  TECHNIQUE: Multidetector CT imaging of the abdomen and pelvis was performed following the standard protocol without oral or intravenous contrast material administration.  COMPARISON:  December 17, 2005  FINDINGS: Lower chest: Lung bases are clear.  Hepatobiliary: No focal liver lesions are evident on this noncontrast enhanced study. Gallbladder wall is not appreciably thickened. There is no biliary duct dilatation.  Pancreas: There is no pancreatic mass or inflammatory focus.  Spleen: No splenic lesions evident.  Adrenals/Urinary Tract: Adrenals appear normal bilaterally. Kidneys bilaterally show no appreciable mass or hydronephrosis on either side. There is no renal or ureteral calculus on either side. Urinary bladder is midline. Urinary bladder wall appears thickened in spite of significant decompression of the urinary bladder.  Stomach/Bowel: There is no appreciable small or large bowel wall or mesenteric thickening. Stomach wall appears somewhat thickened. Note that no attempt to distend stomach was made. There is no appreciable bowel obstruction. No free air or portal venous air.  Vascular/Lymphatic: There is no abdominal aortic aneurysm. No vascular lesions are evident on this noncontrast enhanced study. There is no adenopathy in the abdomen or pelvis by size criteria. There are scattered  subcentimeter lymph nodes in the mesentery, a finding regarded as nonspecific.  Reproductive: Uterus is directed somewhat superiorly. There is no evident pelvic mass.  Other: Appendix is not appreciable. There is no periappendiceal region inflammation. There is no ascites or abscess in the abdomen or pelvis. There is a minimal ventral hernia containing only fat.  Musculoskeletal: There is degenerative change at L5-S1 with disc narrowing at this level. 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 There are no blastic or lytic bone lesions. No intramuscular or abdominal wall lesion evident.  IMPRESSION: 1. Urinary bladder wall is felt to be thickened. Suspect a degree of cystitis.  2. Stomach wall appears somewhat thickened, although no attempt has been made to distend the stomach. A degree of gastritis cannot be excluded. Bowel wall elsewhere appears unremarkable. No bowel obstruction. No free air or portal venous air.  3.  No renal or ureteral calculus.  No hydronephrosis.  4.  No abscess.  No  periappendiceal region inflammation.  5.  Subcentimeter mesenteric lymph nodes, regarded as nonspecific.  6.  Minimal ventral hernia containing only fat.  7. Relatively mild osteitis condensans ilia bilaterally, a finding of questionable clinical significance.   Electronically Signed   By: Bretta Bang III M.D.   On: 04/20/2017 14:39    CLINICAL DATA:  27 y/o F; abdominal bloating and lower back pain. UTI symptoms for 2 days.  EXAM: CT ABDOMEN AND PELVIS WITH CONTRAST  TECHNIQUE: Multidetector CT imaging of the abdomen and pelvis was performed using the standard protocol following bolus administration of intravenous contrast.  CONTRAST:  ISOVUE-300 IOPAMIDOL (ISOVUE-300) INJECTION 61%  COMPARISON:  04/20/2017 CT abdomen and pelvis.  FINDINGS: Lower chest: No acute abnormality.  Hepatobiliary: No focal liver abnormality is seen. No  gallstones, gallbladder wall thickening, or biliary dilatation.  Pancreas: Unremarkable. No pancreatic ductal dilatation or surrounding inflammatory changes.  Spleen: Normal in size without focal abnormality.  Adrenals/Urinary Tract: Adrenal glands are unremarkable. Kidneys are normal, without renal calculi, focal lesion, or hydronephrosis. Bladder is unremarkable.  Stomach/Bowel: Stomach is within normal limits. Appendix appears normal. No evidence of bowel wall thickening, distention, or inflammatory changes.  Vascular/Lymphatic: No significant vascular findings are present. No enlarged abdominal or pelvic lymph nodes.  Reproductive: Uterus and bilateral adnexa are unremarkable.  Other: No abdominal wall hernia or abnormality. No abdominopelvic ascites.  Musculoskeletal: No fracture is seen. Stable discogenic degenerative changes at the L5-S1 level. Mild osteitis condensans ilii.  IMPRESSION: 1. No acute process identified. 2. 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   Electronically Signed   By: Mitzi Hansen M.D.   On: 10/12/2017 16:32  I have independently reviewed the films  Assessment & Plan:    1. Microscopic hematuria Explained to the patient that there are a number of causes that can be associated with blood in the urine, such as stones,  UTI's, damage to the urinary tract and/or cancer. At this time, I felt that the patient warranted further urologic evaluation.   The AUA guidelines state that a CT urogram is the preferred imaging study to evaluate hematuria - patient is breast feeding and has undergone a non contrast and a contrast CT recently Her reproductive status is tubal ligation Explained to the patient that the imaging studies performed on 03/2017 and Nov 08, 2017 are not the recommended studies by the AUA for the work-up of blood in the urine.    The imaging studies that were performed lack the  detail of excluding some urological tumors. The recommended study is a CT urogram.  This study does require the use of contrast material.    At this time, he/she may choose to forego the appropriate study with the understanding that they are risking a missed diagnosis of a urological cancer and proceed with in office cystoscopy or undergo cystoscopy with RTG's in the OR - she has opted to undergo cystoscopy in the OR with RTG's I also explained the risks of general anesthesia, such as: MI, CVA, paralysis, coma and/or death.  UA was positive for 3-10 RBC's Urine culture  BUN + creatinine    Return for cystoscopy with bilateral RTG's .  These notes generated with voice recognition software. I apologize for typographical errors.  Michiel Cowboy, PA-C  [facility] [address]  [place], [place] [address] [phone]

## 2017-12-08 ENCOUNTER — Other Ambulatory Visit: Payer: Self-pay

## 2017-12-08 ENCOUNTER — Other Ambulatory Visit: Payer: Self-pay | Admitting: Radiology

## 2017-12-08 ENCOUNTER — Encounter: Payer: Self-pay | Admitting: Urology

## 2017-12-08 ENCOUNTER — Ambulatory Visit: Payer: Medicaid Other | Admitting: Urology

## 2017-12-08 VITALS — BP 107/69 | HR 82 | Resp 16 | Ht 64.0 in | Wt 180.0 lb

## 2017-12-08 DIAGNOSIS — R3129 Other microscopic hematuria: Secondary | ICD-10-CM

## 2017-12-08 LAB — URINALYSIS, COMPLETE
Bilirubin, UA: NEGATIVE
GLUCOSE, UA: NEGATIVE
Ketones, UA: NEGATIVE
LEUKOCYTES UA: NEGATIVE
Nitrite, UA: NEGATIVE
PH UA: 6 (ref 5.0–7.5)
Protein, UA: NEGATIVE
Specific Gravity, UA: 1.025 (ref 1.005–1.030)
Urobilinogen, Ur: 0.2 mg/dL (ref 0.2–1.0)

## 2017-12-08 LAB — MICROSCOPIC EXAMINATION: WBC UA: NONE SEEN /HPF (ref 0–5)

## 2017-12-08 NOTE — Patient Instructions (Signed)
Cystoscopy  Cystoscopy is a procedure that is used to help diagnose and sometimes treat conditions that affect that lower urinary tract. The lower urinary tract includes the bladder and the tube that drains urine from the bladder out of the body (urethra). Cystoscopy is performed with a thin, tube-shaped instrument with a light and camera at the end (cystoscope). The cystoscope may be hard (rigid) or flexible, depending on the goal of the procedure.The cystoscope is inserted through the urethra, into the bladder.  Cystoscopy may be recommended if you have:   Urinary tractinfections that keep coming back (recurring).   Blood in the urine (hematuria).   Loss of bladder control (urinary incontinence) or an overactive bladder.   Unusual cells found in a urine sample.   A blockage in the urethra.   Painful urination.   An abnormality in the bladder found during an intravenous pyelogram (IVP) or CT scan.    Cystoscopy may also be done to remove a sample of tissue to be examined under a microscope (biopsy).  Tell a health care provider about:   Any allergies you have.   All medicines you are taking, including vitamins, herbs, eye drops, creams, and over-the-counter medicines.   Any problems you or family members have had with anesthetic medicines.   Any blood disorders you have.   Any surgeries you have had.   Any medical conditions you have.   Whether you are pregnant or may be pregnant.  What are the risks?  Generally, this is a safe procedure. However, problems may occur, including:   Infection.   Bleeding.   Allergic reactions to medicines.   Damage to other structures or organs.    What happens before the procedure?   Ask your health care provider about:  ? Changing or stopping your regular medicines. This is especially important if you are taking diabetes medicines or blood thinners.  ? Taking medicines such as aspirin and ibuprofen. These medicines can thin your blood. Do not take these medicines  before your procedure if your health care provider instructs you not to.   Follow instructions from your health care provider about eating or drinking restrictions.   You may be given antibiotic medicine to help prevent infection.   You may have an exam or testing, such as X-rays of the bladder, urethra, or kidneys.   You may have urine tests to check for signs of infection.   Plan to have someone take you home after the procedure.  What happens during the procedure?   To reduce your risk of infection,your health care team will wash or sanitize their hands.   You will be given one or more of the following:  ? A medicine to help you relax (sedative).  ? A medicine to numb the area (local anesthetic).   The area around the opening of your urethra will be cleaned.   The cystoscope will be passed through your urethra into your bladder.   Germ-free (sterile)fluid will flow through the cystoscope to fill your bladder. The fluid will stretch your bladder so that your surgeon can clearly examine your bladder walls.   The cystoscope will be removed and your bladder will be emptied.  The procedure may vary among health care providers and hospitals.  What happens after the procedure?   You may have some soreness or pain in your abdomen and urethra. Medicines will be available to help you.   You may have some blood in your urine.   Do not   drive for 24 hours if you received a sedative.  This information is not intended to replace advice given to you by your health care provider. Make sure you discuss any questions you have with your health care provider.  Document Released: 06/05/2000 Document Revised: 10/17/2015 Document Reviewed: 04/25/2015  Elsevier Interactive Patient Education  2018 [employer]

## 2017-12-09 LAB — BUN+CREAT
BUN/Creatinine Ratio: 33 — ABNORMAL HIGH (ref 9–23)
BUN: 24 mg/dL — ABNORMAL HIGH (ref 6–20)
Creatinine, Ser: 0.72 mg/dL (ref 0.57–1.00)
GFR calc Af Amer: 135 mL/min/{1.73_m2} (ref >59)
GFR, EST NON AFRICAN AMERICAN: 117 mL/min/{1.73_m2} (ref >59)

## 2017-12-10 ENCOUNTER — Other Ambulatory Visit: Payer: Self-pay | Admitting: Radiology

## 2017-12-11 ENCOUNTER — Other Ambulatory Visit: Payer: Self-pay | Admitting: Urology

## 2017-12-11 LAB — CULTURE, URINE COMPREHENSIVE

## 2017-12-11 MED ORDER — NITROFURANTOIN MONOHYD MACRO 100 MG PO CAPS: 100.0000 mg | ORAL_CAPSULE | Freq: Two times a day (BID) | ORAL | 0 refills | Status: DC

## 2017-12-11 NOTE — Progress Notes (Signed)
See notes under labs. 

## 2017-12-12 ENCOUNTER — Other Ambulatory Visit: Payer: Self-pay

## 2017-12-12 ENCOUNTER — Emergency Department
Admission: EM | Admit: 2017-12-12 | Discharge: 2017-12-12 | Disposition: A | Discharge status: Home / Self Care | Payer: Medicaid Other | Attending: Emergency Medicine | Admitting: Emergency Medicine

## 2017-12-12 ENCOUNTER — Encounter: Payer: Self-pay | Admitting: Emergency Medicine

## 2017-12-12 DIAGNOSIS — L03211 Cellulitis of face: Secondary | ICD-10-CM | POA: Diagnosis not present

## 2017-12-12 DIAGNOSIS — Z79899 Other long term (current) drug therapy: Secondary | ICD-10-CM | POA: Insufficient documentation

## 2017-12-12 DIAGNOSIS — L539 Erythematous condition, unspecified: Secondary | ICD-10-CM | POA: Diagnosis present

## 2017-12-12 MED ORDER — MUPIROCIN 2 % EX OINT: 1.0000 "application " | TOPICAL_OINTMENT | Freq: Two times a day (BID) | CUTANEOUS | 0 refills | Status: DC

## 2017-12-12 MED ORDER — CLINDAMYCIN HCL 150 MG PO CAPS
300.0000 mg | ORAL_CAPSULE | Freq: Three times a day (TID) | ORAL | 0 refills | Status: DC
Start: 2017-12-12 — End: 2017-12-28

## 2017-12-12 NOTE — Discharge Instructions (Addendum)
Follow-up with your regular doctor if you are worsening.  Return emergency department if you are unable to see your regular doctor.  Medication as prescribed.  Please see the person that put the piercing and so they can remove it.

## 2017-12-12 NOTE — ED Triage Notes (Signed)
Pt comes into the ED via POV c/o infection around her dermal piercing.  Patient has had the piercing for over year but woke up the other morning with redness around the site.  Patient in NAD at this time and she denies any drainage or fevers.

## 2017-12-12 NOTE — ED Provider Notes (Signed)
[facility] Emergency Department Provider Note  ____________________________________________   First MD Initiated Contact with Patient 12/12/17 1409     (approximate)  I have reviewed the triage vital signs and the nursing notes.   HISTORY  Chief Complaint Cellulitis    HPI Alyssa Kemp is a 27 y.o. female presents to the ER c/o of redness on her face near a one year old facial piercing.  She denies fever or chills.  No change in vision or pain with eye movement.     Past Medical History:  Diagnosis Date  . Bipolar depression (HCC)   . GERD (gastroesophageal reflux disease)   . Gestational diabetes   . History of macrosomia in infant in prior pregnancy, currently pregnant   . 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 Scoliosis     Patient Active Problem List   Diagnosis Date Noted  . Chlamydia 02/15/2017  . Encounter for sterilization 09/19/2016  . Normal labor and delivery 09/18/2016  . Labor and delivery indication for care or intervention 09/14/2016  . Gestational diabetes mellitus (GDM) affecting pregnancy, antepartum 09/05/2016  . High risk pregnancy, antepartum 09/05/2016  . Bipolar 1 disorder (HCC) 09/05/2016  . Rh negative state in antepartum period 09/05/2016  . GERD (gastroesophageal reflux disease) 08/28/2016  . 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 Anxiety, generalized 11/05/2015  . 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/05/2015  . History of chlamydia infection 11/05/2015  . History of abnormal Pap smear 02/21/2013    Past Surgical History:  Procedure Laterality Date  . ADENOIDECTOMY    . 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 TONSILLECTOMY    . TUBAL LIGATION Bilateral 09/19/2016   Procedure: POST PARTUM TUBAL LIGATION;  Surgeon: Conard NovakStephen D Jackson, MD;  Location: [facility] ORS;  Service: Gynecology;  Laterality: Bilateral;  . WISDOM TOOTH EXTRACTION      Prior to Admission medications     Medication Sig Start Date End Date Taking? Authorizing Provider  clindamycin (CLEOCIN) 150 MG capsule Take 2 capsules (300 mg total) by mouth 3 (three) times daily. 12/12/17   Yareli Carthen, Roselyn BeringSusan W, PA-C  mupirocin ointment (BACTROBAN) 2 % Place 1 application into the nose 2 (two) times daily. 12/12/17   Bodhi Stenglein, Roselyn BeringSusan W, PA-C  omeprazole (PRILOSEC) 20 MG capsule Take 1 capsule (20 mg total) by mouth daily. 10/19/17   Particia NearingLane, Rachel Elizabeth, PA-C  VRAYLAR capsule TK 1 C PO HS 09/15/17   [provider]  VYVANSE 20 MG capsule Take 20 mg by mouth every morning. 02/11/17   [provider]    Allergies Sulfa antibiotics; Sulfacetamide sodium; Sulfasalazine; Doxycycline; Cantaloupe (diagnostic); Amoxicillin-pot clavulanate; and Penicillins  Family History  Problem Relation Age of Onset  . Cervical cancer Mother   . Ovarian cancer Mother   . Hypertension Maternal Grandmother   . Pancreatic cancer Maternal Grandmother   . Diabetes Maternal Grandmother   . Coronary artery disease Maternal Grandfather   . Congestive Heart Failure Maternal Grandfather   . Hypertension Maternal Grandfather   . Prostate cancer Maternal Grandfather   . Diabetes Maternal Grandfather   . Hypertension Paternal Grandmother   . Diabetes Paternal Grandmother   . Heart disease Paternal Grandfather   . Hypertension Paternal Grandfather   . Breast cancer Paternal Aunt     Social History Social History   Tobacco Use  . Smoking status: Never Smoker  . Smokeless tobacco: Never Used  Substance Use Topics  .  Alcohol use: No  . Drug use: No    Review of Systems  Constitutional: No fever/chills Eyes: No visual changes. ENT: No sore throat. Respiratory: Denies cough Genitourinary: Negative for dysuria. Musculoskeletal: Negative for back pain. Skin: Positive for redness and swelling on the left side of the face near a dermal piercing    ____________________________________________   PHYSICAL  EXAM:  VITAL SIGNS: ED Triage Vitals  Enc Vitals Group     BP 12/12/17 1236 104/63     Pulse Rate 12/12/17 1236 70     Resp 12/12/17 1236 15     Temp 12/12/17 1236 98 F (36.7 C)     Temp Source 12/12/17 1236 Oral     SpO2 12/12/17 1236 100 %     Weight 12/12/17 1238 180 lb (81.6 kg)     Height 12/12/17 1238 5\' 4"  (1.626 m)     Head Circumference --      Peak Flow --      Pain Score 12/12/17 1237 7     Pain Loc --      Pain Edu? --      Excl. in GC? --     Constitutional: Alert and oriented. Well appearing and in no acute distress. Eyes: Conjunctivae are normal.  Head: Atraumatic.  Face with a large amount of tender swollen red area expanding from the left zygomatic arch over to the nasal bridge.  No pustule is noted.  This area does surround a dermal piercing.  There is crusting around the piercing Nose: No congestion/rhinnorhea. Mouth/Throat: Mucous membranes are moist.   Cardiovascular: Normal rate, regular rhythm. Respiratory: Normal respiratory effort.  No retractions GU: deferred Musculoskeletal: FROM all extremities, warm and well perfused Neurologic:  Normal speech and language.  Skin:  Skin is warm, dry and intact. No rash noted. Psychiatric: Mood and affect are normal. Speech and behavior are normal.  ____________________________________________   LABS (all labs ordered are listed, but only abnormal results are displayed)  Labs Reviewed - No data to display ____________________________________________   ____________________________________________  RADIOLOGY    ____________________________________________   PROCEDURES  Procedure(s) performed: No  Procedures    ____________________________________________   INITIAL IMPRESSION / ASSESSMENT AND PLAN / ED COURSE  Pertinent labs & imaging results that were available during my care of the patient were reviewed by me and considered in my medical decision making (see chart for details).  Patient  is 27 year old female presents emergency department complaining of redness and swelling on the face near the piercing that she got over a year ago.  She states there is crusting at the site also.  Denies fever chills.  On physical exam the left cheek has a large amount of redness and swelling surrounding the dermal piercing.  There is no drainage noted at this time.  However there is some crusting at the piercing.  Discussed the findings with patient.  Told her that she has an infection from the piercing.  She was given a prescription for clindamycin.  She is to follow-up with the person that gave her the piercing to have it removed.  She is to return to the emergency department if she is worsening.  She states she understands comply with our instructions.  She was discharged in stable condition       As part of my medical decision making, I reviewed the following data within the electronic MEDICAL RECORD NUMBER Nursing notes reviewed and incorporated, Notes from prior ED visits and  Controlled Substance Database  ____________________________________________   FINAL CLINICAL IMPRESSION(S) / ED DIAGNOSES  Final diagnoses:  Cellulitis of face      NEW MEDICATIONS STARTED DURING THIS VISIT:  Discharge Medication List as of 12/12/2017  2:22 PM    START taking these medications   Details  clindamycin (CLEOCIN) 150 MG capsule Take 2 capsules (300 mg total) by mouth 3 (three) times daily., Starting Sun 12/12/2017, Print    mupirocin ointment (BACTROBAN) 2 % Place 1 application into the nose 2 (two) times daily., Starting Sun 12/12/2017, Print         Note:  This document was prepared using Dragon voice recognition software and may include unintentional dictation errors.    Faythe Ghee, PA-C 12/12/17 1521    Jene Every, MD 12/15/17 1316

## 2017-12-13 ENCOUNTER — Telehealth: Payer: Self-pay

## 2017-12-13 NOTE — Telephone Encounter (Signed)
See noted dated 12/11/2017. She cannot breast feed while on this antibiotic.   Patient notified she states she was put on Clindamycin over the weekend for an infection of a piercing on her face. Per Carollee HerterShannon ok to hold off on nitrofurantion and finish course of clindamycin. Patient will stop by office on 12-20-17 for UA recheck to make sure infection is clear.

## 2017-12-13 NOTE — Telephone Encounter (Signed)
-----   Message from Harle BattiestShannon A McGowan, PA-C sent at 12/11/2017 12:16 PM EDT ----- Please let Alyssa Kemp know that her urine culture was positive for infection.  She needs to start nitrofurantoin 100 mg, one twice daily for seven days.  She then needs to provide an urine on July 1st to recheck for infection.  She has a procedure on 12/28/2017 and we need to make sure the infection has cleared.

## 2017-12-20 ENCOUNTER — Telehealth: Payer: Self-pay | Admitting: Urology

## 2017-12-20 ENCOUNTER — Other Ambulatory Visit: Payer: Self-pay | Admitting: Radiology

## 2017-12-20 ENCOUNTER — Other Ambulatory Visit: Payer: Medicaid Other

## 2017-12-20 DIAGNOSIS — R3129 Other microscopic hematuria: Secondary | ICD-10-CM | POA: Diagnosis not present

## 2017-12-20 NOTE — Telephone Encounter (Signed)
Pt came in to leave urine sample and wanted you to know she was unable to start Macrobid til yesterday.  She had an infection from a facial piercing and was on another antibiotic and finished that one first before beginning Macrobid.  Just F.Y.I.  She wanted you to know.

## 2017-12-21 ENCOUNTER — Encounter
Admission: RE | Admit: 2017-12-21 | Discharge: 2017-12-21 | Disposition: A | Discharge status: Home / Self Care | Payer: Medicaid Other | Source: Ambulatory Visit | Attending: Urology | Admitting: Urology

## 2017-12-21 ENCOUNTER — Other Ambulatory Visit: Payer: Self-pay

## 2017-12-21 DIAGNOSIS — Z01818 Encounter for other preprocedural examination: Secondary | ICD-10-CM | POA: Insufficient documentation

## 2017-12-21 LAB — URINALYSIS, COMPLETE
Bilirubin, UA: NEGATIVE
Glucose, UA: NEGATIVE
KETONES UA: NEGATIVE
Leukocytes, UA: NEGATIVE
Nitrite, UA: NEGATIVE
PROTEIN UA: NEGATIVE
SPEC GRAV UA: 1.025 (ref 1.005–1.030)
Urobilinogen, Ur: 0.2 mg/dL (ref 0.2–1.0)
pH, UA: 5.5 (ref 5.0–7.5)

## 2017-12-21 LAB — MICROSCOPIC EXAMINATION
Epithelial Cells (non renal): >10 /hpf — ABNORMAL HIGH (ref 0–10)
WBC UA: NONE SEEN /HPF (ref 0–5)

## 2017-12-21 NOTE — Patient Instructions (Signed)
Your procedure is scheduled on: 12-28-17 TUESDAY Report to Same Day Surgery 2nd floor medical mall [facility] Entrance-take elevator on left to 2nd floor.  Check in with surgery information desk.) To find out your arrival time please call [phone] between 1PM - 3PM on 12-27-17 MONDAY  Remember: Instructions that are not followed completely may result in serious medical risk, up to and including death, or upon the discretion of your surgeon and anesthesiologist your surgery may need to be rescheduled.    _x___ 1. Do not eat food after midnight the night before your procedure. NO GUM OR CANDY AFTER MIDNIGHT.  You may drink clear liquids up to 2 hours before you are scheduled to arrive at the hospital for your procedure.  Do not drink clear liquids within 2 hours of your scheduled arrival to the hospital.  Clear liquids include  --Water or Apple juice without pulp  --Clear carbohydrate beverage such as ClearFast or Gatorade  --Black Coffee or Clear Tea (No milk, no creamers, do not add anything to the coffee or Tea     __x__ 2. No Alcohol for 24 hours before or after surgery.   __x__3. No Smoking or e-cigarettes for 24 prior to surgery.  Do not use any chewable tobacco products for at least 6 hour prior to surgery   ____  4. Bring all medications with you on the day of surgery if instructed.    __x__ 5. Notify your doctor if there is any change in your medical condition     (cold, fever, infections).    x___6. On the morning of surgery brush your teeth with toothpaste and water.  You may rinse your mouth with mouth wash if you wish.  Do not swallow any toothpaste or mouthwash.   Do not wear jewelry, make-up, hairpins, clips or nail polish.  Do not wear lotions, powders, or perfumes. You may wear deodorant.  Do not shave 48 hours prior to surgery. Men may shave face and neck.  Do not bring valuables to the hospital.    [facility] is not responsible for any belongings or  valuables.               Contacts, dentures or bridgework may not be worn into surgery.  Leave your suitcase in the car. After surgery it may be brought to your room.  For patients admitted to the hospital, discharge time is determined by your treatment team.  _  Patients discharged the day of surgery will not be allowed to drive home.  You will need someone to drive you home and stay with you the night of your procedure.    Please read over the following fact sheets that you were given:   [facility] Preparing for Surgery   _x___ TAKE THE FOLLOWING MEDICATION THE MORNING OF SURGERY WITH A SMALL SIP OF WATER. These include:  1. PRILOSEC   2. TAKE A PRILOSEC THE NIGHT BEFORE YOUR SURGERY  3.   4.  5.  6.  ____Fleets enema or Magnesium Citrate as directed.   _x___ Use CHG Soap or sage wipes as directed on instruction sheet   ____ Use inhalers on the day of surgery and bring to hospital day of surgery  ____ Stop Metformin and Janumet 2 days prior to surgery.    ____ Take 1/2 of usual insulin dose the night before surgery and none on the morning surgery.   ____ Follow recommendations from Cardiologist, Pulmonologist or PCP regarding stopping Aspirin, Coumadin,  Plavix ,Eliquis, Effient, or Pradaxa, and Pletal.  X____Stop Anti-inflammatories such as Advil, Aleve, Ibuprofen, Motrin, Naproxen, Naprosyn, Goodies powders or aspirin products NOW- OK to take Tylenol   ____ Stop supplements until after surgery.    ____ Bring C-Pap to the hospital.

## 2017-12-22 ENCOUNTER — Encounter
Admission: RE | Admit: 2017-12-22 | Discharge: 2017-12-22 | Disposition: A | Discharge status: Home / Self Care | Payer: Medicaid Other | Source: Ambulatory Visit | Attending: Urology | Admitting: Urology

## 2017-12-22 DIAGNOSIS — Z01812 Encounter for preprocedural laboratory examination: Secondary | ICD-10-CM | POA: Diagnosis not present

## 2017-12-22 LAB — BASIC METABOLIC PANEL
Anion gap: 5 (ref 5–15)
BUN: 15 mg/dL (ref 6–20)
CO2: 29 mmol/L (ref 22–32)
CREATININE: 0.69 mg/dL (ref 0.44–1.00)
Calcium: 9.5 mg/dL (ref 8.9–10.3)
Chloride: 105 mmol/L (ref 98–111)
GFR calc Af Amer: >60 mL/min (ref >60)
GFR calc non Af Amer: >60 mL/min (ref >60)
Glucose, Bld: 90 mg/dL (ref 70–99)
Potassium: 4.7 mmol/L (ref 3.5–5.1)
Sodium: 139 mmol/L (ref 135–145)

## 2017-12-22 LAB — CBC
HCT: 38.6 % (ref 35.0–47.0)
Hemoglobin: 13.8 g/dL (ref 12.0–16.0)
MCH: 30.6 pg (ref 26.0–34.0)
MCHC: 35.9 g/dL (ref 32.0–36.0)
MCV: 85.5 fL (ref 80.0–100.0)
Platelets: 296 10*3/uL (ref 150–440)
RBC: 4.51 MIL/uL (ref 3.80–5.20)
RDW: 13.3 % (ref 11.5–14.5)
WBC: 8.2 10*3/uL (ref 3.6–11.0)

## 2017-12-24 LAB — CULTURE, URINE COMPREHENSIVE

## 2017-12-28 ENCOUNTER — Telehealth: Payer: Self-pay | Admitting: Urology

## 2017-12-28 ENCOUNTER — Ambulatory Visit: Payer: Medicaid Other | Admitting: Anesthesiology

## 2017-12-28 ENCOUNTER — Encounter: Payer: Self-pay | Admitting: Anesthesiology

## 2017-12-28 ENCOUNTER — Ambulatory Visit
Admission: RE | Admit: 2017-12-28 | Discharge: 2017-12-28 | Disposition: A | Discharge status: Home / Self Care | Payer: Medicaid Other | Source: Ambulatory Visit | Attending: Urology | Admitting: Urology

## 2017-12-28 ENCOUNTER — Encounter
Admission: RE | Disposition: A | Discharge status: Home / Self Care | Payer: Self-pay | Source: Ambulatory Visit | Attending: Urology

## 2017-12-28 DIAGNOSIS — R3129 Other microscopic hematuria: Secondary | ICD-10-CM | POA: Diagnosis not present

## 2017-12-28 DIAGNOSIS — Z8249 Family history of ischemic heart disease and other diseases of the circulatory system: Secondary | ICD-10-CM | POA: Diagnosis not present

## 2017-12-28 DIAGNOSIS — Z888 Allergy status to other drugs, medicaments and biological substances status: Secondary | ICD-10-CM | POA: Insufficient documentation

## 2017-12-28 DIAGNOSIS — Z882 Allergy status to sulfonamides status: Secondary | ICD-10-CM | POA: Diagnosis not present

## 2017-12-28 DIAGNOSIS — Z79899 Other long term (current) drug therapy: Secondary | ICD-10-CM | POA: Diagnosis not present

## 2017-12-28 DIAGNOSIS — Z881 Allergy status to other antibiotic agents status: Secondary | ICD-10-CM | POA: Insufficient documentation

## 2017-12-28 DIAGNOSIS — Z87440 Personal history of urinary (tract) infections: Secondary | ICD-10-CM | POA: Insufficient documentation

## 2017-12-28 DIAGNOSIS — K219 Gastro-esophageal reflux disease without esophagitis: Secondary | ICD-10-CM | POA: Insufficient documentation

## 2017-12-28 DIAGNOSIS — F319 Bipolar disorder, unspecified: Secondary | ICD-10-CM | POA: Insufficient documentation

## 2017-12-28 DIAGNOSIS — R311 Benign essential microscopic hematuria: Secondary | ICD-10-CM | POA: Diagnosis not present

## 2017-12-28 DIAGNOSIS — M419 Scoliosis, unspecified: Secondary | ICD-10-CM | POA: Insufficient documentation

## 2017-12-28 HISTORY — PX: CYSTOSCOPY W/ RETROGRADES: SHX1426

## 2017-12-28 LAB — POCT PREGNANCY, URINE: Preg Test, Ur: NEGATIVE

## 2017-12-28 SURGERY — CYSTOSCOPY, WITH RETROGRADE PYELOGRAM
Anesthesia: General | Site: Ureter | Laterality: Bilateral | Wound class: Clean Contaminated

## 2017-12-28 MED ORDER — CEFAZOLIN SODIUM-DEXTROSE 1-4 GM/50ML-% IV SOLN
INTRAVENOUS | Status: AC
  Filled 2017-12-28: qty 50

## 2017-12-28 MED ORDER — MIDAZOLAM HCL 2 MG/2ML IJ SOLN
INTRAMUSCULAR | Status: DC | PRN
  Administered 2017-12-28: 2 mg via INTRAVENOUS

## 2017-12-28 MED ORDER — FENTANYL CITRATE (PF) 100 MCG/2ML IJ SOLN: 25.0000 ug | INTRAMUSCULAR | Status: DC | PRN

## 2017-12-28 MED ORDER — FENTANYL CITRATE (PF) 100 MCG/2ML IJ SOLN
INTRAMUSCULAR | Status: DC | PRN
  Administered 2017-12-28: 100 ug via INTRAVENOUS

## 2017-12-28 MED ORDER — ONDANSETRON HCL 4 MG/2ML IJ SOLN: 4.0000 mg | Freq: Once | INTRAMUSCULAR | Status: DC | PRN

## 2017-12-28 MED ORDER — FENTANYL CITRATE (PF) 100 MCG/2ML IJ SOLN
INTRAMUSCULAR | Status: AC
  Filled 2017-12-28: qty 2

## 2017-12-28 MED ORDER — IOTHALAMATE MEGLUMINE 43 % IV SOLN
INTRAVENOUS | Status: DC | PRN
  Administered 2017-12-28: 15 mL via URETHRAL

## 2017-12-28 MED ORDER — LIDOCAINE HCL (CARDIAC) PF 100 MG/5ML IV SOSY
PREFILLED_SYRINGE | INTRAVENOUS | Status: DC | PRN
  Administered 2017-12-28: 100 mg via INTRAVENOUS

## 2017-12-28 MED ORDER — ROCURONIUM BROMIDE 50 MG/5ML IV SOLN
INTRAVENOUS | Status: AC
  Filled 2017-12-28: qty 1

## 2017-12-28 MED ORDER — LIDOCAINE HCL (PF) 2 % IJ SOLN
INTRAMUSCULAR | Status: AC
  Filled 2017-12-28: qty 10

## 2017-12-28 MED ORDER — MIDAZOLAM HCL 2 MG/2ML IJ SOLN
INTRAMUSCULAR | Status: AC
  Filled 2017-12-28: qty 2

## 2017-12-28 MED ORDER — CEFAZOLIN SODIUM-DEXTROSE 1-4 GM/50ML-% IV SOLN
1.0000 g | INTRAVENOUS | Status: AC
  Administered 2017-12-28: 1 g via INTRAVENOUS

## 2017-12-28 MED ORDER — ROCURONIUM BROMIDE 100 MG/10ML IV SOLN
INTRAVENOUS | Status: DC | PRN
  Administered 2017-12-28: 40 mg via INTRAVENOUS

## 2017-12-28 MED ORDER — PROPOFOL 10 MG/ML IV BOLUS
INTRAVENOUS | Status: DC | PRN
  Administered 2017-12-28: 180 mg via INTRAVENOUS

## 2017-12-28 MED ORDER — SUGAMMADEX SODIUM 200 MG/2ML IV SOLN
INTRAVENOUS | Status: DC | PRN
  Administered 2017-12-28: 200 mg via INTRAVENOUS

## 2017-12-28 MED ORDER — LACTATED RINGERS IV SOLN
INTRAVENOUS | Status: DC
  Administered 2017-12-28: 10:00:00 via INTRAVENOUS

## 2017-12-28 MED ORDER — PROPOFOL 10 MG/ML IV BOLUS
INTRAVENOUS | Status: AC
  Filled 2017-12-28: qty 20

## 2017-12-28 MED ORDER — SUGAMMADEX SODIUM 200 MG/2ML IV SOLN
INTRAVENOUS | Status: AC
  Filled 2017-12-28: qty 2

## 2017-12-28 MED ORDER — ONDANSETRON HCL 4 MG/2ML IJ SOLN
INTRAMUSCULAR | Status: AC
  Filled 2017-12-28: qty 2

## 2017-12-28 SURGICAL SUPPLY — 18 items
BAG DRAIN CYSTO-URO LG1000N (MISCELLANEOUS) ×2 IMPLANT
BRUSH SCRUB EZ  4% CHG (MISCELLANEOUS) ×1
BRUSH SCRUB EZ 4% CHG (MISCELLANEOUS) ×1 IMPLANT
CATH URETL 5X70 OPEN END (CATHETERS) ×2 IMPLANT
CONRAY 43 FOR UROLOGY 50M (MISCELLANEOUS) ×2 IMPLANT
DRAPE UTILITY 15X26 TOWEL STRL (DRAPES) ×2 IMPLANT
GLOVE BIO SURGEON STRL SZ8 (GLOVE) ×2 IMPLANT
GOWN STRL REUS W/ TWL LRG LVL3 (GOWN DISPOSABLE) ×2 IMPLANT
GOWN STRL REUS W/TWL LRG LVL3 (GOWN DISPOSABLE) ×2
KIT TURNOVER CYSTO (KITS) ×2 IMPLANT
PACK CYSTO AR (MISCELLANEOUS) ×2 IMPLANT
SENSORWIRE 0.038 NOT ANGLED (WIRE)
SET CYSTO W/LG BORE CLAMP LF (SET/KITS/TRAYS/PACK) ×2 IMPLANT
SOL .9 NS 3000ML IRR  AL (IV SOLUTION) ×1
SOL .9 NS 3000ML IRR UROMATIC (IV SOLUTION) ×1 IMPLANT
SURGILUBE 2OZ TUBE FLIPTOP (MISCELLANEOUS) ×2 IMPLANT
WATER STERILE IRR 1000ML POUR (IV SOLUTION) ×2 IMPLANT
WIRE SENSOR 0.038 NOT ANGLED (WIRE) IMPLANT

## 2017-12-28 NOTE — Telephone Encounter (Signed)
-----   Message from Riki AltesScott C Stoioff, MD sent at 12/28/2017 12:09 PM EDT ----- Please sched f/u appt with shannon in 6-8 weeks

## 2017-12-28 NOTE — Telephone Encounter (Signed)
Appt scheduled, pt notified   Thanks,  French Anaracy

## 2017-12-28 NOTE — Interval H&P Note (Signed)
History and Physical Interval Note:  12/28/2017 10:58 AM  Alyssa Peri JeffersonN Poole  has presented today for surgery, with the diagnosis of microscopic hematuria  The various methods of treatment have been discussed with the patient and family. After consideration of risks, benefits and other options for treatment, the patient has consented to  Procedure(s): CYSTOSCOPY WITH RETROGRADE PYELOGRAM (Bilateral) as a surgical intervention .  The patient's history has been reviewed, patient examined, no change in status, stable for surgery.  I have reviewed the patient's chart and labs.  Questions were answered to the patient's satisfaction.     Nahia Nissan C Maddelynn Moosman

## 2017-12-28 NOTE — Transfer of Care (Signed)
Immediate Anesthesia Transfer of Care Note  Patient: Alyssa Kemp  Procedure(s) Performed: CYSTOSCOPY WITH RETROGRADE PYELOGRAM (Bilateral Ureter)  Patient Location: PACU  Anesthesia Type:General  Level of Consciousness: awake and patient cooperative  Airway & Oxygen Therapy: Patient Spontanous Breathing and Patient connected to nasal cannula oxygen  Post-op Assessment: Report given to RN and Post -op Vital signs reviewed and stable  Post vital signs: Reviewed and stable  Last Vitals:  Vitals Value Taken Time  BP    Temp    Pulse 76 12/28/2017 12:02 PM  Resp 17 12/28/2017 12:02 PM  SpO2 97 % 12/28/2017 12:02 PM  Vitals shown include unvalidated device data.  Last Pain:  Vitals:   12/28/17 1002  TempSrc: Tympanic  PainSc: 0-No pain         Complications: No apparent anesthesia complications

## 2017-12-28 NOTE — Anesthesia Procedure Notes (Signed)
Procedure Name: Intubation Date/Time: 12/28/2017 11:29 AM Performed by: Bernardo Heater, CRNA Pre-anesthesia Checklist: Patient identified, Emergency Drugs available, Suction available and Patient being monitored Patient Re-evaluated:Patient Re-evaluated prior to induction Oxygen Delivery Method: Circle system utilized Preoxygenation: Pre-oxygenation with 100% oxygen Induction Type: IV induction Laryngoscope Size: Mac and 3 Grade View: Grade I Tube size: 7.0 mm Number of attempts: 1 Placement Confirmation: ETT inserted through vocal cords under direct vision,  positive ETCO2 and breath sounds checked- equal and bilateral Secured at: 21 cm Tube secured with: Tape Dental Injury: Teeth and Oropharynx as per pre-operative assessment

## 2017-12-28 NOTE — Anesthesia Preprocedure Evaluation (Signed)
Anesthesia Evaluation  Patient identified by MRN, date of birth, ID band Patient awake    Reviewed: Allergy & Precautions, H&P , NPO status , Patient's Chart, lab work & pertinent test results, reviewed documented beta blocker date and time   Airway Mallampati: II   Neck ROM: full    Dental  (+) Poor Dentition   Pulmonary neg pulmonary ROS,    Pulmonary exam normal        Cardiovascular negative cardio ROS Normal cardiovascular exam Rhythm:regular Rate:Normal     Neuro/Psych PSYCHIATRIC DISORDERS negative neurological ROS  negative psych ROS   GI/Hepatic negative GI ROS, Neg liver ROS, GERD  Medicated,  Endo/Other  negative endocrine ROSdiabetes  Renal/GU negative Renal ROS  negative genitourinary   Musculoskeletal   Abdominal   Peds  Hematology negative hematology ROS (+)   Anesthesia Other Findings Past Medical History: No date: Bipolar depression (HCC) No date: GERD (gastroesophageal reflux disease) No date: Gestational diabetes No date: History of macrosomia in infant in prior pregn* 12/16/2015: LGSIL on Pap smear of cervix No date: Scoliosis Past Surgical History: No date: ADENOIDECTOMY 01/15/2016: COLPOSCOPY     Comment: CIN 1 No date: TONSILLECTOMY No date: WISDOM TOOTH EXTRACTION BMI    Body Mass Index:  33.64 kg/m     Reproductive/Obstetrics negative OB ROS                             Anesthesia Physical  Anesthesia Plan  ASA: II  Anesthesia Plan: General   Post-op Pain Management:    Induction: Intravenous  PONV Risk Score and Plan:   Airway Management Planned: Oral ETT  Additional Equipment:   Intra-op Plan:   Post-operative Plan: Extubation in OR  Informed Consent: I have reviewed the patients History and Physical, chart, labs and discussed the procedure including the risks, benefits and alternatives for the proposed anesthesia with the patient or  authorized representative who has indicated his/her understanding and acceptance.   Dental Advisory Given  Plan Discussed with: CRNA  Anesthesia Plan Comments:         Anesthesia Quick Evaluation

## 2017-12-28 NOTE — Anesthesia Postprocedure Evaluation (Signed)
Anesthesia Post Note  Patient: Alyssa Kemp  Procedure(s) Performed: CYSTOSCOPY WITH RETROGRADE PYELOGRAM (Bilateral Ureter)  Patient location during evaluation: PACU Anesthesia Type: General Level of consciousness: awake and alert and oriented Pain management: pain level controlled Vital Signs Assessment: post-procedure vital signs reviewed and stable Respiratory status: spontaneous breathing Cardiovascular status: blood pressure returned to baseline Anesthetic complications: no     Last Vitals:  Vitals:   12/28/17 1002 12/28/17 1203  BP: (!) 170/73 (!) 124/49  Pulse: 67 76  Resp: 18 17  Temp: 36.8 C (!) 36.3 C  SpO2: 98% 98%    Last Pain:  Vitals:   12/28/17 1203  TempSrc:   PainSc: Asleep                 Stefany Starace

## 2017-12-28 NOTE — Discharge Instructions (Signed)

## 2017-12-28 NOTE — Anesthesia Post-op Follow-up Note (Signed)
Anesthesia QCDR form completed.        

## 2017-12-31 NOTE — Op Note (Signed)
Preoperative diagnosis:  1. Microhematuria  Postoperative diagnosis:  1. Microhematuria  Procedure: 1. Cystoscopy 2. Bilateral retrograde pyelography with interpretation  Surgeon: Riki AltesScott C Stoioff, MD  Anesthesia: General  Complications: None  Intraoperative findings:  Cystoscopy-no bladder mucosal lesions  Right retrograde pyelogram-ureter normal in caliber without filling defect, narrowing.  Renal pelvis and collecting system normal in appearance without filling defect.  Delicate calyces.  Prompt emptying on real-time fluoroscopy.  Left retrograde pyelogram-ureter normal in caliber without filling defect, narrowing.  Renal pelvis and collecting system normal in appearance without filling defect.  Delicate calyces.  Prompt emptying on real-time fluoroscopy.  EBL: Minimal  Specimens: None  Indication: Alyssa Kemp is a 27 y.o. patient with a history of recurrent UTI and persistent microhematuria seen in the office by Michiel CowboyShannon McGowan.  She elected further evaluation with cystoscopy under anesthesia and bilateral retrograde pyelography.  After reviewing the management options for treatment, he elected to proceed with the above surgical procedure(s). We have discussed the potential benefits and risks of the procedure, side effects of the proposed treatment, the likelihood of the patient achieving the goals of the procedure, and any potential problems that might occur during the procedure or recuperation. Informed consent has been obtained.  Description of procedure:  The patient was taken to the operating room and general anesthesia was induced.  The patient was placed in the dorsal lithotomy position, prepped and draped in the usual sterile fashion, and preoperative antibiotics were administered. A preoperative time-out was performed.   A 22 French cystoscope was lubricated and passed per urethra.  There was mild erythema and hypervascularity of the urethra.  The bladder mucosa was  closely inspected and there was no mucosal erythema, solid or papillary lesions.  There was squamous metaplasia of the trigone with some areas of hypervascularity.  The ureteral orifices were normal appearing with clear efflux.  A 6 French open-ended ureteral catheter was placed through the cystoscope and advanced into the right ureteral orifice.  Retrograde pyelogram was performed with findings as described above.  An identical procedure was performed on the contralateral side with findings as described above.  The bladder was emptied and the cystoscope was removed.  After anesthetic reversal she was transported to the PACU in stable condition.   Riki AltesScott C Stoioff, M.D.

## 2018-01-07 ENCOUNTER — Ambulatory Visit: Payer: Medicaid Other | Admitting: Family Medicine

## 2018-01-07 ENCOUNTER — Encounter: Payer: Self-pay | Admitting: Family Medicine

## 2018-01-07 VITALS — BP 106/66 | HR 70 | Temp 98.4°F | Ht 64.0 in | Wt 180.7 lb

## 2018-01-07 DIAGNOSIS — W57XXXA Bitten or stung by nonvenomous insect and other nonvenomous arthropods, initial encounter: Secondary | ICD-10-CM

## 2018-01-07 DIAGNOSIS — R300 Dysuria: Secondary | ICD-10-CM

## 2018-01-07 DIAGNOSIS — S0096XA Insect bite (nonvenomous) of unspecified part of head, initial encounter: Secondary | ICD-10-CM | POA: Diagnosis not present

## 2018-01-07 LAB — UA/M W/RFLX CULTURE, ROUTINE
Bilirubin, UA: NEGATIVE
Glucose, UA: NEGATIVE
Ketones, UA: NEGATIVE
Leukocytes, UA: NEGATIVE
Nitrite, UA: NEGATIVE
Specific Gravity, UA: 1.03 (ref 1.005–1.030)
UUROB: 0.2 mg/dL (ref 0.2–1.0)
pH, UA: 5.5 (ref 5.0–7.5)

## 2018-01-07 LAB — MICROSCOPIC EXAMINATION

## 2018-01-07 MED ORDER — TRIAMCINOLONE ACETONIDE 0.1 % EX CREA: 1.0000 "application " | TOPICAL_CREAM | Freq: Two times a day (BID) | CUTANEOUS | 0 refills | Status: DC

## 2018-01-07 NOTE — Progress Notes (Signed)
BP 106/66 (BP Location: Right Arm, Patient Position: Sitting, Cuff Size: Normal)   Pulse 70   Temp 98.4 F (36.9 C) (Oral)   Ht 5\' 4"  (1.626 m)   Wt 180 lb 11.2 oz (82 kg)   LMP 12/15/2017 (Approximate)   SpO2 98%   BMI 31.02 kg/m    Subjective:    [name], female    DOB: Aug 03, 1990, 27 y.o.   MRN: [id number]  HPI: Alyssa Kemp is a 27 y.o. female  Chief Complaint  Patient presents with  . Dysuria    Ongoing for 4 days.  . Bug Bites    Noticed bug bites in various places Wednesday. One on right side of face that is painful.    4 days of urgency and dysuria. Denies fevers, N/V, new low back pain, abdominal pain. Not taking anything for sxs. Recently went to Urology for cystoscopy for recurrent UTIs, no abnormal findings on cystoscopy. Was treated at that time for a UTI with resolution.   Several swollen bites across body, worst one on face. Noticed it Wednesday, red, swollen, painful. Not using anything OTC. Does have pets in the home. Unaware if anyone else at home has any bites.   Past Medical History:  Diagnosis Date  . Bipolar depression (HCC)   . GERD (gastroesophageal reflux disease)   . Gestational diabetes   . History of macrosomia in infant in prior pregnancy, currently pregnant   . 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 Scoliosis    Social History   Socioeconomic History  . Marital status: Single    Spouse name: Not on file  . Number of children: Not on file  . Years of education: Not on file  . Highest education level: Not on file  Occupational History  . Not on file  Social Needs  . Financial resource strain: Not on file  . Food insecurity:    Worry: Not on file    Inability: Not on file  . Transportation needs:    Medical: Not on file    Non-medical: Not on file  Tobacco Use  . Smoking status: Never Smoker  . Smokeless tobacco: Never Used  Substance and Sexual Activity  . Alcohol use: No  . Drug use: No  . Sexual  activity: Yes    Birth control/protection: None, Surgical  Lifestyle  . Physical activity:    Days per week: Not on file    Minutes per session: Not on file  . Stress: Not on file  Relationships  . Social connections:    Talks on phone: Not on file    Gets together: Not on file    Attends religious service: Not on file    Active member of club or organization: Not on file    Attends meetings of clubs or organizations: Not on file    Relationship status: Not on file  . Intimate partner violence:    Fear of current or ex partner: Not on file    Emotionally abused: Not on file    Physically abused: Not on file    Forced sexual activity: Not on file  Other Topics Concern  . Not on file  Social History Narrative  . Not on file   Relevant past medical, surgical, family and social history reviewed and updated as indicated. Interim medical history since our last visit reviewed. Allergies and medications reviewed and updated.  Review of Systems  Per HPI unless specifically indicated  above     Objective:    BP 106/66 (BP Location: Right Arm, Patient Position: Sitting, Cuff Size: Normal)   Pulse 70   Temp 98.4 F (36.9 C) (Oral)   Ht 5\' 4"  (1.626 m)   Wt 180 lb 11.2 oz (82 kg)   LMP 12/15/2017 (Approximate)   SpO2 98%   BMI 31.02 kg/m   Wt Readings from Last 3 Encounters:  01/07/18 180 lb 11.2 oz (82 kg)  12/28/17 180 lb (81.6 kg)  12/12/17 180 lb (81.6 kg)    Physical Exam  Constitutional: She is oriented to person, place, and time. She appears well-developed and well-nourished. No distress.  HENT:  Head: Atraumatic.  Eyes: Pupils are equal, round, and reactive to light. Conjunctivae are normal.  Neck: Normal range of motion. Neck supple.  Cardiovascular: Normal rate and regular rhythm.  Pulmonary/Chest: Effort normal and breath sounds normal.  Abdominal: Soft. Bowel sounds are normal. There is no tenderness.  Musculoskeletal: Normal range of motion. She exhibits no  tenderness (No CVA ttp).  Neurological: She is alert and oriented to person, place, and time.  Skin: Skin is warm and dry.  Psychiatric: She has a normal mood and affect. Her behavior is normal.  Nursing note and vitals reviewed.  Results for orders placed or performed during the hospital encounter of 12/28/17  Pregnancy, urine POC  Result Value Ref Range   Preg Test, Ur NEGATIVE NEGATIVE      Assessment & Plan:   Problem List Items Addressed This Visit    None    Visit Diagnoses    Dysuria    -  Primary   Suspect inflammatory rather than bacterial as U/A WNL. Probiotics, avoid irritants like sugary or caffienated beverages, increase water intake   Relevant Orders   UA/M w/rflx Culture, Routine   Insect bite of head, unspecified part, initial encounter       Triamcinolone cream sent, advised to take benadryl 1-2 times daily. No evidence of infection currently but return precautions given watching out for that change      Follow up plan: Return if symptoms worsen or fail to improve.

## 2018-01-07 NOTE — Patient Instructions (Signed)
Follow up as needed

## 2018-01-11 ENCOUNTER — Emergency Department
Admission: EM | Admit: 2018-01-11 | Discharge: 2018-01-11 | Disposition: A | Discharge status: Home / Self Care | Payer: Medicaid Other | Attending: Emergency Medicine | Admitting: Emergency Medicine

## 2018-01-11 ENCOUNTER — Encounter: Payer: Self-pay | Admitting: Emergency Medicine

## 2018-01-11 ENCOUNTER — Other Ambulatory Visit: Payer: Self-pay

## 2018-01-11 DIAGNOSIS — M79602 Pain in left arm: Secondary | ICD-10-CM | POA: Diagnosis not present

## 2018-01-11 DIAGNOSIS — M792 Neuralgia and neuritis, unspecified: Secondary | ICD-10-CM

## 2018-01-11 DIAGNOSIS — Z79899 Other long term (current) drug therapy: Secondary | ICD-10-CM | POA: Insufficient documentation

## 2018-01-11 DIAGNOSIS — F411 Generalized anxiety disorder: Secondary | ICD-10-CM | POA: Diagnosis not present

## 2018-01-11 LAB — GLUCOSE, CAPILLARY: Glucose-Capillary: 92 mg/dL (ref 70–99)

## 2018-01-11 MED ORDER — PREDNISONE 10 MG PO TABS: ORAL_TABLET | ORAL | 0 refills | Status: DC

## 2018-01-11 MED ORDER — CYCLOBENZAPRINE HCL 5 MG PO TABS: ORAL_TABLET | ORAL | 0 refills | Status: DC

## 2018-01-11 MED ORDER — PREDNISONE 20 MG PO TABS
60.0000 mg | ORAL_TABLET | Freq: Once | ORAL | Status: AC
  Administered 2018-01-11: 60 mg via ORAL
  Filled 2018-01-11: qty 3

## 2018-01-11 NOTE — ED Provider Notes (Signed)
[facility] Emergency Department Provider Note  ____________________________________________  Time seen: Approximately 11:06 PM  I have reviewed the triage vital signs and the nursing notes.   HISTORY  Chief Complaint Arm Pain    HPI Alyssa Kemp is a 27 y.o. female that presents emergency department for evaluation of arm pain and numbness for 3 days. Arm feels numb but also burns to touch. Pain started on Sunday in her left wrist and radiates up her arm. She has a shooting pain when she moves left shoulder or left wrist. Pain increases the more she uses her arm.  She does not work.  This is never happened before. She does not smoke. No recent surgeries. No bedrest. No recent travel.  No trauma. No fever, neck pain, shortness of breath, chest pain, jaw pain, chills, sweats.   Past Medical History:  Diagnosis Date  . Bipolar depression (HCC)   . GERD (gastroesophageal reflux disease)   . Gestational diabetes   . History of macrosomia in infant in prior pregnancy, currently pregnant   . 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 Scoliosis     Patient Active Problem List   Diagnosis Date Noted  . Chlamydia 02/15/2017  . Encounter for sterilization 09/19/2016  . Normal labor and delivery 09/18/2016  . Labor and delivery indication for care or intervention 09/14/2016  . Gestational diabetes mellitus (GDM) affecting pregnancy, antepartum 09/05/2016  . High risk pregnancy, antepartum 09/05/2016  . Bipolar 1 disorder (HCC) 09/05/2016  . Rh negative state in antepartum period 09/05/2016  . GERD (gastroesophageal reflux disease) 08/28/2016  . 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 Anxiety, generalized 11/05/2015  . 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/05/2015  . History of chlamydia infection 11/05/2015  . History of abnormal Pap smear 02/21/2013    Past Surgical History:  Procedure Laterality Date  . ADENOIDECTOMY    .  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 CYSTOSCOPY W/ RETROGRADES Bilateral 12/28/2017   Procedure: CYSTOSCOPY WITH RETROGRADE PYELOGRAM;  Surgeon: Riki AltesStoioff, Scott C, MD;  Location: [facility] ORS;  Service: Urology;  Laterality: Bilateral;  . TONSILLECTOMY    . TUBAL LIGATION Bilateral 09/19/2016   Procedure: POST PARTUM TUBAL LIGATION;  Surgeon: Conard NovakStephen D Jackson, MD;  Location: [facility] ORS;  Service: Gynecology;  Laterality: Bilateral;  . WISDOM TOOTH EXTRACTION      Prior to Admission medications   Medication Sig Start Date End Date Taking? Authorizing Provider  cyclobenzaprine (FLEXERIL) 5 MG tablet Take 1-2 tablets 3 times daily as needed 01/11/18   Enid DerryWagner, Devonne Lalani, PA-C  escitalopram (LEXAPRO) 20 MG tablet Take 20 mg by mouth daily.    [provider]  omeprazole (PRILOSEC) 20 MG capsule Take 1 capsule (20 mg total) by mouth daily. Patient taking differently: Take 20 mg by mouth as needed.  10/19/17   Particia NearingLane, Rachel Elizabeth, PA-C  predniSONE (DELTASONE) 10 MG tablet Take 6 tablets on day 1, take 5 tablets on day 2, take 4 tablets on day 3, take 3 tablets on day 4, take 2 tablets on day 5, take 1 tablet on day 6 01/11/18   Enid DerryWagner, Layson Bertsch, PA-C  triamcinolone cream (KENALOG) 0.1 % Apply 1 application topically 2 (two) times daily. 01/07/18   Particia NearingLane, Rachel Elizabeth, PA-C  VYVANSE 20 MG capsule Take 20 mg by mouth as needed.  02/11/17   [provider]    Allergies Sulfa antibiotics; Sulfacetamide sodium; Sulfasalazine; Doxycycline; Cantaloupe (diagnostic); Amoxicillin-pot clavulanate;  and Penicillins  Family History  Problem Relation Age of Onset  . Cervical cancer Mother   . Ovarian cancer Mother   . Hypertension Maternal Grandmother   . Pancreatic cancer Maternal Grandmother   . Diabetes Maternal Grandmother   . Coronary artery disease Maternal Grandfather   . Congestive Heart Failure Maternal Grandfather   . Hypertension Maternal Grandfather   . Prostate cancer Maternal Grandfather    . Diabetes Maternal Grandfather   . Hypertension Paternal Grandmother   . Diabetes Paternal Grandmother   . Heart disease Paternal Grandfather   . Hypertension Paternal Grandfather   . Breast cancer Paternal Aunt     Social History Social History   Tobacco Use  . Smoking status: Never Smoker  . Smokeless tobacco: Never Used  Substance Use Topics  . Alcohol use: No  . Drug use: No     Review of Systems  Constitutional: No fever/chills Cardiovascular: No chest pain. Respiratory: No cough. No SOB. Gastrointestinal:  No nausea, no vomiting.  Musculoskeletal: Positive for arm pain.  Skin: Negative for rash, abrasions, lacerations, ecchymosis. Neurological: Negative for headaches   ____________________________________________   PHYSICAL EXAM:  VITAL SIGNS: ED Triage Vitals  Enc Vitals Group     BP 01/11/18 2213 110/66     Pulse Rate 01/11/18 2213 81     Resp 01/11/18 2213 20     Temp 01/11/18 2213 98.6 F (37 C)     Temp Source 01/11/18 2213 Oral     SpO2 01/11/18 2213 98 %     Weight 01/11/18 2207 180 lb (81.6 kg)     Height 01/11/18 2207 5\' 4"  (1.626 m)     Head Circumference --      Peak Flow --      Pain Score 01/11/18 2206 7     Pain Loc --      Pain Edu? --      Excl. in GC? --      Constitutional: Alert and oriented. Well appearing and in no acute distress. Eyes: Conjunctivae are normal. PERRL. EOMI. Head: Atraumatic. ENT:      Ears:      Nose: No congestion/rhinnorhea.      Mouth/Throat: Mucous membranes are moist.  Neck: No stridor. No cervical spine tenderness to palpation. Cardiovascular: Normal rate, regular rhythm.  Good peripheral circulation. Equal radial pulses.  Respiratory: Normal respiratory effort without tachypnea or retractions. Lungs CTAB. Good air entry to the bases with no decreased or absent breath sounds. Musculoskeletal: Full range of motion to all extremities. No gross deformities appreciated. Tenderness to palpation of  shoulder and upper biceps. Pain elicited with ROM of left shoulder and left elbow. Strength equal in upper extremities bilaterally.  Neurologic:  Normal speech and language. No gross focal neurologic deficits are appreciated.  Skin:  Skin is warm, dry and intact. No rash noted. Psychiatric: Mood and affect are normal. Speech and behavior are normal. Patient exhibits appropriate insight and judgement.   ____________________________________________   LABS (all labs ordered are listed, but only abnormal results are displayed)  Labs Reviewed  GLUCOSE, CAPILLARY  CBG MONITORING, ED   ____________________________________________  EKG  NSR ____________________________________________  RADIOLOGY   No results found.  ____________________________________________    PROCEDURES  Procedure(s) performed:    Procedures    Medications  predniSONE (DELTASONE) tablet 60 mg (has no administration in time range)     ____________________________________________   INITIAL IMPRESSION / ASSESSMENT AND PLAN / ED COURSE  Pertinent labs & imaging  results that were available during my care of the patient were reviewed by me and considered in my medical decision making (see chart for details).  Review of the [place] CSRS was performed in accordance of the [facility] prior to dispensing any controlled drugs.     Patient's diagnosis is consistent with radiculopathy. Vital signs and exam are reassuring.  Pain is reproducible with palpation and range of motion of shoulder and wrist. EKG shows NSR.  Blood glucose is 93.  Patient had a recent thyroid panel.  Patient declines additional blood work. Patient will be discharged home with prescriptions for prednisone and flexeril. Patient is to follow up with PCP as directed. Patient is given ED precautions to return to the ED for any worsening or new symptoms.     ____________________________________________  FINAL CLINICAL IMPRESSION(S) / ED  DIAGNOSES  Final diagnoses:  Left arm pain  Radicular pain in left arm      NEW MEDICATIONS STARTED DURING THIS VISIT:  ED Discharge Orders        Ordered    predniSONE (DELTASONE) 10 MG tablet     01/11/18 2327    cyclobenzaprine (FLEXERIL) 5 MG tablet     01/11/18 2327          This chart was dictated using voice recognition software/Dragon. Despite best efforts to proofread, errors can occur which can change the meaning. Any change was purely unintentional.    Enid Derry, PA-C 01/11/18 Gwendlyn Deutscher, [place], MD 01/12/18 [phone]

## 2018-01-11 NOTE — ED Triage Notes (Signed)
Patient to ER for c/o left arm pain since Sunday. Reports "numbness" at first, but when further questioned, patient states it's more of a burning pain that hurts to touch. Denies any known injury. Does report tingling sensation.

## 2018-01-18 DIAGNOSIS — F411 Generalized anxiety disorder: Secondary | ICD-10-CM | POA: Diagnosis not present

## 2018-02-01 DIAGNOSIS — F411 Generalized anxiety disorder: Secondary | ICD-10-CM | POA: Diagnosis not present

## 2018-02-08 NOTE — Progress Notes (Deleted)
02/09/2018 8:21 PM   Alyssa Kemp February 27, 1991 [id number]  Referring provider: Particia Nearing, PA-C [address] [place], [place] [address]  No chief complaint on file.   HPI:  Patient is a 27 -year-old Caucasian female with a history of hematuria who presents today for follow-up.    Patient completed hematuria work-up in 12/28/2017 with contrast CT and cystoscopy with RTG's in the OR.  No worrisome findings - squamous metaplasia of the urethra.   CT Renal stone study performed in 03/2017 noted adrenals appear normal bilaterally. Kidneys bilaterally show no appreciable mass or hydronephrosis on either side. There is no renal or ureteral calculus on either side. Urinary bladder is midline. Urinary bladder wall appears thickened in spite of significant decompression of the urinary bladder.  Contrast CT in 09/2017 noted adrenal glands are unremarkable. Kidneys are normal, without renal calculi, focal lesion, or hydronephrosis.  Bladder is unremarkable.  She is not a smoker. She is not exposed to secondhand smoke.  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     She has a high BMI.    She does not drink water.  She drinks milk and Pepsi's and Dr. Pat Kocher.    Her UA today is ***  PMH: Past Medical History:  Diagnosis Date  . Bipolar depression (HCC)   . GERD (gastroesophageal reflux disease)   . Gestational diabetes   . History of macrosomia in infant in prior pregnancy, currently pregnant   . 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 Scoliosis     Surgical History: Past Surgical History:  Procedure Laterality Date  . ADENOIDECTOMY    . 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 CYSTOSCOPY W/ RETROGRADES Bilateral 12/28/2017   Procedure: CYSTOSCOPY WITH RETROGRADE PYELOGRAM;  Surgeon: [name], MD;  Location: [facility] ORS;  Service: Urology;  Laterality: Bilateral;  . TONSILLECTOMY    . TUBAL LIGATION Bilateral 09/19/2016   Procedure: POST PARTUM  TUBAL LIGATION;  Surgeon: [name], MD;  Location: [facility] ORS;  Service: Gynecology;  Laterality: Bilateral;  . WISDOM TOOTH EXTRACTION      Home Medications:  Allergies as of 02/09/2018      Reactions   Sulfa Antibiotics Shortness Of Breath, Other (See Comments)   Other Reaction: OTHER REACTION   Sulfacetamide Sodium Shortness Of Breath   Sulfasalazine Shortness Of Breath   Doxycycline Swelling   Cantaloupe (diagnostic) Swelling   Amoxicillin-pot Clavulanate Other (See Comments)   Penicillins Other (See Comments)   Other reaction(s): Other (See Comments)      Medication List        Accurate as of 02/08/18  8:21 PM. Always use your most recent med list.          cyclobenzaprine 5 MG tablet Commonly known as:  FLEXERIL Take 1-2 tablets 3 times daily as needed   escitalopram 20 MG tablet Commonly known as:  LEXAPRO Take 20 mg by mouth daily.   omeprazole 20 MG capsule Commonly known as:  PRILOSEC Take 1 capsule (20 mg total) by mouth daily.   predniSONE 10 MG tablet Commonly known as:  DELTASONE Take 6 tablets on day 1, take 5 tablets on day 2, take 4 tablets on day 3, take 3 tablets on day 4, take 2 tablets on day 5, take 1 tablet on day 6   triamcinolone cream 0.1 % Commonly known as:  KENALOG Apply 1 application topically 2 (two) times daily.   VYVANSE  20 MG capsule Generic drug:  lisdexamfetamine Take 20 mg by mouth as needed.       Allergies:  Allergies  Allergen Reactions  . Sulfa Antibiotics Shortness Of Breath and Other (See Comments)    Other Reaction: OTHER REACTION  . Sulfacetamide Sodium Shortness Of Breath  . Sulfasalazine Shortness Of Breath  . Doxycycline Swelling  . Cantaloupe (Diagnostic) Swelling  . Amoxicillin-Pot Clavulanate Other (See Comments)  . Penicillins Other (See Comments)    Other reaction(s): Other (See Comments)    Family History: Family History  Problem Relation Age of Onset  . Cervical cancer Mother   . Ovarian  cancer Mother   . Hypertension Maternal Grandmother   . Pancreatic cancer Maternal Grandmother   . Diabetes Maternal Grandmother   . Coronary artery disease Maternal Grandfather   . Congestive Heart Failure Maternal Grandfather   . Hypertension Maternal Grandfather   . Prostate cancer Maternal Grandfather   . Diabetes Maternal Grandfather   . Hypertension Paternal Grandmother   . Diabetes Paternal Grandmother   . Heart disease Paternal Grandfather   . Hypertension Paternal Grandfather   . Breast cancer Paternal Aunt     Social History:  reports that she has never smoked. She has never used smokeless tobacco. She reports that she does not drink alcohol or use drugs.  ROS:                                        Physical Exam: There were no vitals taken for this visit.  Constitutional:  Well nourished. Alert and oriented, No acute distress. HEENT: [place] AT, moist mucus membranes.  Trachea midline, no masses. Cardiovascular: No clubbing, cyanosis, or edema. Respiratory: Normal respiratory effort, no increased work of breathing. GI: Abdomen is soft, non tender, non distended, no abdominal masses. Liver and spleen not palpable.  No hernias appreciated.  Stool sample for occult testing is not indicated.   GU: No CVA tenderness.  No bladder fullness or masses.  Normal external genitalia, normal pubic hair distribution, no lesions.  Normal urethral meatus, no lesions, no prolapse, no discharge.   No urethral masses, tenderness and/or tenderness. No bladder fullness, tenderness or masses. Normal vagina mucosa, good estrogen effect, no discharge, no lesions, good pelvic support, no cystocele or rectocele noted.  No cervical motion tenderness.  Uterus is freely mobile and non-fixed.  No adnexal/parametria masses or tenderness noted.  Anus and perineum are without rashes or lesions.    Skin: No rashes, bruises or suspicious lesions. Lymph: No cervical or inguinal  adenopathy. Neurologic: Grossly intact, no focal deficits, moving all 4 extremities. Psychiatric: Normal mood and affect.  Laboratory Data: Lab Results  Component Value Date   WBC 8.2 12/22/2017   HGB 13.8 12/22/2017   HCT 38.6 12/22/2017   MCV 85.5 12/22/2017   PLT 296 12/22/2017    Lab Results  Component Value Date   CREATININE 0.69 12/22/2017    No results found for: PSA  No results found for: TESTOSTERONE  No results found for: HGBA1C  Lab Results  Component Value Date   TSH 0.629 07/02/2017    No results found for: CHOL, HDL, CHOLHDL, VLDL, LDLCALC  Lab Results  Component Value Date   AST 21 10/12/2017   Lab Results  Component Value Date   ALT 19 10/12/2017   No components found for: ALKALINEPHOPHATASE No components found for: BILIRUBINTOTAL  No  results found for: ESTRADIOL   Urinalysis *** See epic.  I have reviewed the labs.  Pertinent Imaging: ***  Assessment & Plan:    1. History of hematuria Hematuria work-up completed July 2019 was negative    No follow-ups on file.  These notes generated with voice recognition software. I apologize for typographical errors.  Michiel CowboySHANNON Duana Benedict, PA-C  [facility] [address]  [place], [place] [address] [phone]

## 2018-02-09 ENCOUNTER — Encounter: Payer: Self-pay | Admitting: Urology

## 2018-02-09 ENCOUNTER — Ambulatory Visit: Payer: Medicaid Other | Admitting: Urology

## 2018-02-09 ENCOUNTER — Ambulatory Visit: Payer: Medicaid Other | Admitting: Physician Assistant

## 2018-02-09 ENCOUNTER — Encounter: Payer: Self-pay | Admitting: Physician Assistant

## 2018-02-09 ENCOUNTER — Other Ambulatory Visit: Payer: Self-pay

## 2018-02-09 VITALS — BP 94/64 | HR 144 | Temp 98.2°F | Ht 64.0 in | Wt 181.3 lb

## 2018-02-09 DIAGNOSIS — B9789 Other viral agents as the cause of diseases classified elsewhere: Secondary | ICD-10-CM

## 2018-02-09 DIAGNOSIS — J069 Acute upper respiratory infection, unspecified: Secondary | ICD-10-CM | POA: Diagnosis not present

## 2018-02-09 MED ORDER — PROMETHAZINE-DM 6.25-15 MG/5ML PO SYRP: 5.0000 mL | ORAL_SOLUTION | Freq: Every evening | ORAL | 0 refills | Status: DC | PRN

## 2018-02-09 NOTE — Progress Notes (Signed)
   Subjective:    [name], female    DOB: 05/12/1991, 27 y.o.   MRN: [id number]  Alyssa Kemp is a 27 y.o. female presenting on 02/09/2018 for Cough (x 4 days/ pt states cough gets wore at nigh. states she has been taking deslym and nightquil but does not help); Headache; Nasal Congestion; and Other (blood pressure and puls might not be accurate as pt keep coughing while taking it)   HPI   No fevers, chills, SOB, difficulty breathing. No productive cough.   Social History   Tobacco Use  . Smoking status: Never Smoker  . Smokeless tobacco: Never Used  Substance Use Topics  . Alcohol use: No  . Drug use: No    Review of Systems Per HPI unless specifically indicated above     Objective:    BP 94/64   Pulse (!) 144   Temp 98.2 F (36.8 C) (Oral)   Ht 5\' 4"  (1.626 m)   Wt 181 lb 4.8 oz (82.2 kg)   SpO2 98%   BMI 31.12 kg/m   Wt Readings from Last 3 Encounters:  02/09/18 181 lb 4.8 oz (82.2 kg)  01/11/18 180 lb (81.6 kg)  01/07/18 180 lb 11.2 oz (82 kg)    Physical Exam  Constitutional: She appears well-developed and well-nourished.  HENT:  Right Ear: External ear normal.  Left Ear: External ear normal.  Mouth/Throat: Oropharynx is clear and moist. No oropharyngeal exudate.  Eyes: Right eye exhibits discharge. Left eye exhibits discharge.  Neck: Neck supple.  Cardiovascular: Normal rate and regular rhythm.  Pulmonary/Chest: Effort normal and breath sounds normal. No respiratory distress. She has no rales.  Lymphadenopathy:    She has no cervical adenopathy.  Skin: Skin is warm and dry.  Psychiatric: She has a normal mood and affect. Her behavior is normal.   Results for orders placed or performed during the hospital encounter of 01/11/18  Glucose, capillary  Result Value Ref Range   Glucose-Capillary 92 70 - 99 mg/dL      Assessment & Plan:  1. Viral URI with cough  Does not need antibiotic. Treat symptomatically as below for viral  URI.   - promethazine-dextromethorphan (PROMETHAZINE-DM) 6.25-15 MG/5ML syrup; Take 5 mLs by mouth at bedtime as needed for cough.  Dispense: 118 mL; Refill: 0   Follow up plan: Return if symptoms worsen or fail to improve.  Osvaldo AngstAdriana Pollak, PA-C [facility]  [facility] 02/10/2018, 12:58 PM

## 2018-02-09 NOTE — Patient Instructions (Signed)
Upper Respiratory Infection, Adult Most upper respiratory infections (URIs) are caused by a virus. A URI affects the nose, throat, and upper air passages. The most common type of URI is often called "the common cold." Follow these instructions at home:  Take medicines only as told by your doctor.  Gargle warm saltwater or take cough drops to comfort your throat as told by your doctor.  Use a warm mist humidifier or inhale steam from a shower to increase air moisture. This may make it easier to breathe.  Drink enough fluid to keep your pee (urine) clear or pale yellow.  Eat soups and other clear broths.  Have a healthy diet.  Rest as needed.  Go back to work when your fever is gone or your doctor says it is okay. ? You may need to stay home longer to avoid giving your URI to others. ? You can also wear a face mask and wash your hands often to prevent spread of the virus.  Use your inhaler more if you have asthma.  Do not use any tobacco products, including cigarettes, chewing tobacco, or electronic cigarettes. If you need help quitting, ask your doctor. Contact a doctor if:  You are getting worse, not better.  Your symptoms are not helped by medicine.  You have chills.  You are getting more short of breath.  You have brown or red mucus.  You have yellow or brown discharge from your nose.  You have pain in your face, especially when you bend forward.  You have a fever.  You have puffy (swollen) neck glands.  You have pain while swallowing.  You have white areas in the back of your throat. Get help right away if:  You have very bad or constant: ? Headache. ? Ear pain. ? Pain in your forehead, behind your eyes, and over your cheekbones (sinus pain). ? Chest pain.  You have long-lasting (chronic) lung disease and any of the following: ? Wheezing. ? Long-lasting cough. ? Coughing up blood. ? A change in your usual mucus.  You have a stiff neck.  You have  changes in your: ? Vision. ? Hearing. ? Thinking. ? Mood. This information is not intended to replace advice given to you by your health care provider. Make sure you discuss any questions you have with your health care provider. Document Released: 11/25/2007 Document Revised: 02/09/2016 Document Reviewed: 09/13/2013 Elsevier Interactive Patient Education  2018 Elsevier Inc.  

## 2018-02-10 ENCOUNTER — Telehealth: Payer: Self-pay | Admitting: Family Medicine

## 2018-02-10 MED ORDER — HYDROCOD POLST-CPM POLST ER 10-8 MG/5ML PO SUER: 5.0000 mL | Freq: Two times a day (BID) | ORAL | 0 refills | Status: DC | PRN

## 2018-02-10 NOTE — Telephone Encounter (Signed)
Tussionex sent.

## 2018-02-10 NOTE — Telephone Encounter (Signed)
Patient agrees with plan but states she's taken promethazine before when she was pregnant and it gave her severe headaches. Patient states the cough syrup is doing the same. Patient wondering if it could be changed to something else.   [place] [place]

## 2018-02-10 NOTE — Telephone Encounter (Signed)
Pt following up on this call request.  Advised pt I would have someone call her back.

## 2018-02-10 NOTE — Telephone Encounter (Signed)
Discussed case with Ricki RodriguezAdriana, agree with her assessment. Continue supportive care and cough syrup. Call next week if not feeling better, and we have on call provider for over weekend if having any issues  Copied from CRM [id number]. Topic: Appointment Scheduling - Scheduling Inquiry for Clinic >> Feb 10, 2018  8:30 AM Crist InfanteHarrald, Kathy J wrote: Reason for CRM: pt saw Adriana yesterday and states she is worse today. Pt  had a fever last night, none today, (tylenol brought it down) cough is worse, coughing up green mucus.  Pt was hoping to see Fleet ContrasRachel today, but no appts. Pt declined to see anyone else. Pt states if she cannot see Fleet ContrasRachel , can she get something else prescribed? Only got cough med Please advise

## 2018-02-15 DIAGNOSIS — F411 Generalized anxiety disorder: Secondary | ICD-10-CM | POA: Diagnosis not present

## 2018-02-22 ENCOUNTER — Encounter: Payer: Self-pay | Admitting: Emergency Medicine

## 2018-02-22 ENCOUNTER — Emergency Department
Admission: EM | Admit: 2018-02-22 | Discharge: 2018-02-22 | Disposition: A | Discharge status: Home / Self Care | Payer: Medicaid Other | Attending: Emergency Medicine | Admitting: Emergency Medicine

## 2018-02-22 ENCOUNTER — Emergency Department: Payer: Medicaid Other

## 2018-02-22 DIAGNOSIS — Z79899 Other long term (current) drug therapy: Secondary | ICD-10-CM | POA: Insufficient documentation

## 2018-02-22 DIAGNOSIS — R05 Cough: Secondary | ICD-10-CM | POA: Diagnosis not present

## 2018-02-22 DIAGNOSIS — N390 Urinary tract infection, site not specified: Secondary | ICD-10-CM | POA: Diagnosis not present

## 2018-02-22 DIAGNOSIS — R300 Dysuria: Secondary | ICD-10-CM | POA: Diagnosis not present

## 2018-02-22 DIAGNOSIS — R059 Cough, unspecified: Secondary | ICD-10-CM

## 2018-02-22 LAB — URINALYSIS, ROUTINE W REFLEX MICROSCOPIC
BILIRUBIN URINE: NEGATIVE
Glucose, UA: NEGATIVE mg/dL
Ketones, ur: NEGATIVE mg/dL
LEUKOCYTES UA: NEGATIVE
NITRITE: NEGATIVE
Protein, ur: NEGATIVE mg/dL
Specific Gravity, Urine: 1.027 (ref 1.005–1.030)
pH: 5 (ref 5.0–8.0)

## 2018-02-22 LAB — POCT PREGNANCY, URINE: Preg Test, Ur: NEGATIVE

## 2018-02-22 MED ORDER — CEPHALEXIN 500 MG PO CAPS: 500.0000 mg | ORAL_CAPSULE | Freq: Two times a day (BID) | ORAL | 0 refills | Status: AC

## 2018-02-22 MED ORDER — BENZONATATE 100 MG PO CAPS: ORAL_CAPSULE | ORAL | 0 refills | Status: DC

## 2018-02-22 MED ORDER — CEPHALEXIN 500 MG PO CAPS
500.0000 mg | ORAL_CAPSULE | Freq: Once | ORAL | Status: AC
  Administered 2018-02-22: 500 mg via ORAL
  Filled 2018-02-22: qty 1

## 2018-02-22 NOTE — ED Provider Notes (Signed)
[facility] Emergency Department Provider Note ____________________________________________  Time seen: 1142  I have reviewed the triage vital signs and the nursing notes.  HISTORY  Chief Complaint  Cough; Urinary Frequency; and Dysuria  HPI Alyssa Kemp is a 27 y.o. female presents herself to the ED for evaluation of an intermittently productive cough for the last week.  She is also experienced several days of urinary frequency, urgency, and dysuria.  She has been taking Azo over-the-counter with some limited improvement.  He denies any interim fevers, chills, or sweats.  She is been taking over-the-counter Delsym with limited benefit to the cough.  She denies any chest pain, shortness of breath, wheezing, or cough induced vomiting.  She also denies any sick contacts, recent travel, or other exposures.  She reports a history of recurrent UTIs, and has been evaluated in the past for microscopic hematuria with cystoscopy.  She denies any urinary symptoms for at least 2 months.  Past Medical History:  Diagnosis Date  . Bipolar depression (HCC)   . GERD (gastroesophageal reflux disease)   . Gestational diabetes   . History of macrosomia in infant in prior pregnancy, currently pregnant   . 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 Scoliosis     Patient Active Problem List   Diagnosis Date Noted  . Chlamydia 02/15/2017  . Encounter for sterilization 09/19/2016  . Normal labor and delivery 09/18/2016  . Labor and delivery indication for care or intervention 09/14/2016  . Gestational diabetes mellitus (GDM) affecting pregnancy, antepartum 09/05/2016  . High risk pregnancy, antepartum 09/05/2016  . Bipolar 1 disorder (HCC) 09/05/2016  . Rh negative state in antepartum period 09/05/2016  . GERD (gastroesophageal reflux disease) 08/28/2016  . 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 Anxiety, generalized 11/05/2015  . Chronic low back pain with  right-sided sciatica 11/05/2015  . History of chlamydia infection 11/05/2015  . History of abnormal Pap smear 02/21/2013    Past Surgical History:  Procedure Laterality Date  . ADENOIDECTOMY    . 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 CYSTOSCOPY W/ RETROGRADES Bilateral 12/28/2017   Procedure: CYSTOSCOPY WITH RETROGRADE PYELOGRAM;  Surgeon: [name], MD;  Location: [facility] ORS;  Service: Urology;  Laterality: Bilateral;  . TONSILLECTOMY    . TUBAL LIGATION Bilateral 09/19/2016   Procedure: POST PARTUM TUBAL LIGATION;  Surgeon: [name], MD;  Location: [facility] ORS;  Service: Gynecology;  Laterality: Bilateral;  . WISDOM TOOTH EXTRACTION      Prior to Admission medications   Medication Sig Start Date End Date Taking? Authorizing Provider  benzonatate (TESSALON PERLES) 100 MG capsule Take 1-2 tabs TID prn cough 02/22/18   Rollin Kotowski, Charlesetta Ivory, PA-C  cephALEXin (KEFLEX) 500 MG capsule Take 1 capsule (500 mg total) by mouth 2 (two) times daily for 7 days. 02/22/18 03/01/18  Johan Antonacci, Charlesetta Ivory, PA-C  chlorpheniramine-HYDROcodone (TUSSIONEX PENNKINETIC ER) 10-8 MG/5ML SUER Take 5 mLs by mouth every 12 (twelve) hours as needed. 02/10/18   Particia Nearing, PA-C  cyclobenzaprine (FLEXERIL) 5 MG tablet Take 1-2 tablets 3 times daily as needed 01/11/18   Enid Derry, PA-C  escitalopram (LEXAPRO) 20 MG tablet Take 20 mg by mouth daily.    [provider]  omeprazole (PRILOSEC) 20 MG capsule Take 1 capsule (20 mg total) by mouth daily. Patient taking differently: Take 20 mg by mouth as needed.  10/19/17   Particia Nearing, PA-C  predniSONE (DELTASONE) 10 MG  tablet Take 6 tablets on day 1, take 5 tablets on day 2, take 4 tablets on day 3, take 3 tablets on day 4, take 2 tablets on day 5, take 1 tablet on day 6 01/11/18   Enid Derry, PA-C  promethazine-dextromethorphan (PROMETHAZINE-DM) 6.25-15 MG/5ML syrup Take 5 mLs by mouth at bedtime as needed for cough. 02/09/18    Trey Sailors, PA-C  QUEtiapine (SEROQUEL) 50 MG tablet Take 50 mg by mouth at bedtime.    [provider]  triamcinolone cream (KENALOG) 0.1 % Apply 1 application topically 2 (two) times daily. 01/07/18   Particia Nearing, PA-C  VYVANSE 20 MG capsule Take 20 mg by mouth as needed.  02/11/17   [provider]    Allergies Sulfa antibiotics; Sulfacetamide sodium; Sulfasalazine; Doxycycline; Cantaloupe (diagnostic); Amoxicillin-pot clavulanate; and Penicillins  Family History  Problem Relation Age of Onset  . Cervical cancer Mother   . Ovarian cancer Mother   . Hypertension Maternal Grandmother   . Pancreatic cancer Maternal Grandmother   . Diabetes Maternal Grandmother   . Coronary artery disease Maternal Grandfather   . Congestive Heart Failure Maternal Grandfather   . Hypertension Maternal Grandfather   . Prostate cancer Maternal Grandfather   . Diabetes Maternal Grandfather   . Hypertension Paternal Grandmother   . Diabetes Paternal Grandmother   . Heart disease Paternal Grandfather   . Hypertension Paternal Grandfather   . Breast cancer Paternal Aunt     Social History Social History   Tobacco Use  . Smoking status: Never Smoker  . Smokeless tobacco: Never Used  Substance Use Topics  . Alcohol use: No  . Drug use: No    Review of Systems  Constitutional: Negative for fever. Eyes: Negative for visual changes. ENT: Negative for sore throat. Cardiovascular: Negative for chest pain. Respiratory: Negative for shortness of breath.  Reports intermittently productive cough as above. Gastrointestinal: Negative for abdominal pain, vomiting and diarrhea. Genitourinary: Positive for dysuria. Musculoskeletal: Negative for back pain. Skin: Negative for rash. Neurological: Negative for headaches, focal weakness or numbness. ____________________________________________  PHYSICAL EXAM:  VITAL SIGNS: ED Triage Vitals  Enc Vitals Group     BP  02/22/18 1058 115/81     Pulse Rate 02/22/18 1058 91     Resp 02/22/18 1058 20     Temp 02/22/18 1058 98.1 F (36.7 C)     Temp Source 02/22/18 1058 Oral     SpO2 02/22/18 1058 96 %     Weight 02/22/18 1059 180 lb (81.6 kg)     Height 02/22/18 1059 5\' 4"  (1.626 m)     Head Circumference --      Peak Flow --      Pain Score 02/22/18 1059 6     Pain Loc --      Pain Edu? --      Excl. in GC? --     Constitutional: Alert and oriented. Well appearing and in no distress. Head: Normocephalic and atraumatic. Eyes: Conjunctivae are normal. PERRL. Normal extraocular movements Ears: Canals clear. TMs intact bilaterally. Nose: No congestion/rhinorrhea/epistaxis. Mouth/Throat: Mucous membranes are moist.  Uvula is midline tonsils are flat. Neck: Supple. No thyromegaly. Hematological/Lymphatic/Immunological: No cervical lymphadenopathy. Cardiovascular: Normal rate, regular rhythm. Normal distal pulses. Respiratory: Normal respiratory effort. No wheezes/rales/rhonchi. Gastrointestinal: Soft and nontender. No distention.  No CVA tenderness on percussion. ____________________________________________   LABS (pertinent positives/negatives)  Labs Reviewed  URINALYSIS, ROUTINE W REFLEX MICROSCOPIC - Abnormal; Notable for the following components:  Result Value   Color, Urine AMBER (*)    APPearance CLEAR (*)    Hgb urine dipstick MODERATE (*)    Bacteria, UA RARE (*)    All other components within normal limits  URINE CULTURE  POCT PREGNANCY, URINE  ____________________________________________   RADIOLOGY  CXR  IMPRESSION: Normal chest radiographs. ____________________________________________  PROCEDURES  Procedures Keflex 500 mg PO ____________________________________________  INITIAL IMPRESSION / ASSESSMENT AND PLAN / ED COURSE  Patient with ED evaluation of 1 week complaint of intermittent productive cough and urinary frequency and dysuria.  Her urinalysis shows rare  bacteria and mild WBCs. She will be treated with Keflex and a urine culture is pending. She will also be given Tessalon Perles for cough relief. She will follow-up with her provider or return as needed.  ____________________________________________  FINAL CLINICAL IMPRESSION(S) / ED DIAGNOSES  Final diagnoses:  Lower urinary tract infectious disease  Cough      Karmen Stabs, Charlesetta Ivory, PA-C 02/22/18 1813    Jene Every, MD 02/23/18 1219

## 2018-02-22 NOTE — ED Notes (Addendum)
See triage note  Presents with prod cough over the past few days   afebrile on arrival   Also has had some urinary freq and pain

## 2018-02-22 NOTE — ED Triage Notes (Signed)
Pt reports for the last week she has had productive cough with green sputum and urinary urgency, frequency and dysuria.

## 2018-02-22 NOTE — Discharge Instructions (Addendum)
You are being treated for a bladder infection and a mild bronchitis. Take the prescription meds as directed. Follow-up with your provider for ongoing symptoms.

## 2018-02-24 LAB — URINE CULTURE
Culture: NO GROWTH
Special Requests: NORMAL

## 2018-02-27 ENCOUNTER — Encounter: Payer: Self-pay | Admitting: Urology

## 2018-02-27 NOTE — Progress Notes (Signed)
Certified letter sent on February 28, 2018 

## 2018-03-08 DIAGNOSIS — F411 Generalized anxiety disorder: Secondary | ICD-10-CM | POA: Diagnosis not present

## 2018-03-31 ENCOUNTER — Encounter: Payer: Self-pay | Admitting: Family Medicine

## 2018-03-31 ENCOUNTER — Other Ambulatory Visit: Payer: Self-pay

## 2018-03-31 ENCOUNTER — Ambulatory Visit: Payer: Medicaid Other | Admitting: Family Medicine

## 2018-03-31 VITALS — BP 117/77 | HR 79 | Temp 98.5°F | Ht 64.0 in | Wt 184.4 lb

## 2018-03-31 DIAGNOSIS — E669 Obesity, unspecified: Secondary | ICD-10-CM | POA: Diagnosis not present

## 2018-03-31 DIAGNOSIS — Z8632 Personal history of gestational diabetes: Secondary | ICD-10-CM | POA: Diagnosis not present

## 2018-03-31 MED ORDER — CLINDAMYCIN PHOS-BENZOYL PEROX 1-5 % EX GEL: Freq: Two times a day (BID) | CUTANEOUS | 0 refills | Status: DC

## 2018-03-31 NOTE — Progress Notes (Signed)
   BP 117/77   Pulse 79   Temp 98.5 F (36.9 C) (Oral)   Ht 5\' 4"  (1.626 m)   Wt 184 lb 6.4 oz (83.6 kg)   SpO2 98%   BMI 31.65 kg/m    Subjective:    [name], female    DOB: 11/22/1990, 27 y.o.   MRN: [id number]  HPI: Alyssa Kemp is a 27 y.o. female  Chief Complaint  Patient presents with  . Weight Check    pt states that she feels like she is gaining weight, states she is always hungry   Here today with concerns about her weight and appetite. Trying to be more active, drinking more water. Hungry every two hours. Does feel like her portion sizes have increased since pregnancy. Tried a month or two off her lexapro and seroquel but didn't notice a major change in her weight. Back on them now. Had gestational diabetes with her last child and concerned she may have diabetes now. No low blood sugar spells.  Is still breastfeeding her daughter.   Relevant past medical, surgical, family and social history reviewed and updated as indicated. Interim medical history since our last visit reviewed. Allergies and medications reviewed and updated.  Review of Systems  Per HPI unless specifically indicated above     Objective:    BP 117/77   Pulse 79   Temp 98.5 F (36.9 C) (Oral)   Ht 5\' 4"  (1.626 m)   Wt 184 lb 6.4 oz (83.6 kg)   SpO2 98%   BMI 31.65 kg/m   Wt Readings from Last 3 Encounters:  03/31/18 184 lb 6.4 oz (83.6 kg)  02/22/18 180 lb (81.6 kg)  02/09/18 181 lb 4.8 oz (82.2 kg)    Physical Exam  Constitutional: She is oriented to person, place, and time. She appears well-developed and well-nourished. No distress.  HENT:  Head: Atraumatic.  Eyes: Conjunctivae and EOM are normal.  Neck: Normal range of motion. Neck supple.  Cardiovascular: Normal rate and regular rhythm.  Pulmonary/Chest: Effort normal and breath sounds normal.  Musculoskeletal: Normal range of motion.  Neurological: She is alert and oriented to person, place, and time.    Skin: Skin is warm and dry.  Psychiatric: She has a normal mood and affect.  Nursing note and vitals reviewed.   Results for orders placed or performed in visit on 03/31/18  HgB A1c  Result Value Ref Range   Hgb A1c MFr Bld 4.6 (L) 4.8 - 5.6 %   Est. average glucose Bld gHb Est-mCnc 85 mg/dL      Assessment & Plan:   Problem List Items Addressed This Visit      Other   Obesity (BMI 30.0-34.9)    Referral placed to dietician, work hard on lifestyle modifications particularly portion control. Will avoid weight loss medicines given age, BMI, coverage, and breastfeeding. Continue to monitor      Relevant Orders   Amb Referral to Nutrition and Diabetic E    Other Visit Diagnoses    History of gestational diabetes    -  Primary   Will check A1C per pt request, BS's since delivery all WNL. Work on lifestyle modifications   Relevant Orders   HgB A1c (Completed)       Follow up plan: Return for CPE.

## 2018-04-01 LAB — HEMOGLOBIN A1C
Est. average glucose Bld gHb Est-mCnc: 85 mg/dL
Hgb A1c MFr Bld: 4.6 % — ABNORMAL LOW (ref 4.8–5.6)

## 2018-04-03 NOTE — Assessment & Plan Note (Signed)
Referral placed to dietician, work hard on lifestyle modifications particularly portion control. Will avoid weight loss medicines given age, BMI, coverage, and breastfeeding. Continue to monitor

## 2018-04-03 NOTE — Patient Instructions (Signed)
Follow up for CPE 

## 2018-04-04 ENCOUNTER — Ambulatory Visit: Payer: Medicaid Other | Admitting: Family Medicine

## 2018-04-04 ENCOUNTER — Encounter: Payer: Self-pay | Admitting: Family Medicine

## 2018-04-04 VITALS — BP 116/78 | HR 90 | Temp 99.0°F | Ht 64.0 in | Wt 181.6 lb

## 2018-04-04 DIAGNOSIS — J020 Streptococcal pharyngitis: Secondary | ICD-10-CM

## 2018-04-04 DIAGNOSIS — J029 Acute pharyngitis, unspecified: Secondary | ICD-10-CM | POA: Diagnosis not present

## 2018-04-04 LAB — RAPID STREP SCREEN (MED CTR MEBANE ONLY): Strep Gp A Ag, IA W/Reflex: POSITIVE — AB

## 2018-04-04 MED ORDER — AZITHROMYCIN 250 MG PO TABS: ORAL_TABLET | ORAL | 0 refills | Status: DC

## 2018-04-04 NOTE — Patient Instructions (Signed)
Follow up as needed

## 2018-04-04 NOTE — Progress Notes (Signed)
   BP 116/78 (BP Location: Left Arm, Patient Position: Sitting, Cuff Size: Normal)   Pulse 90   Temp 99 F (37.2 C)   Ht 5\' 4"  (1.626 m)   Wt 181 lb 9 oz (82.4 kg)   SpO2 97%   BMI 31.17 kg/m    Subjective:    [name], female    DOB: 23-Mar-1991, 27 y.o.   MRN: [id number]  HPI: Alyssa Kemp is a 27 y.o. female  Chief Complaint  Patient presents with  . Sore Throat   2 days of sore, swollen throat. No congestion, fever, post nasal drainage, ear pain, or other. Not trying anything besides advil for sxs. No sick contacts.   Relevant past medical, surgical, family and social history reviewed and updated as indicated. Interim medical history since our last visit reviewed. Allergies and medications reviewed and updated.  Review of Systems  Per HPI unless specifically indicated above     Objective:    BP 116/78 (BP Location: Left Arm, Patient Position: Sitting, Cuff Size: Normal)   Pulse 90   Temp 99 F (37.2 C)   Ht 5\' 4"  (1.626 m)   Wt 181 lb 9 oz (82.4 kg)   SpO2 97%   BMI 31.17 kg/m   Wt Readings from Last 3 Encounters:  04/04/18 181 lb 9 oz (82.4 kg)  03/31/18 184 lb 6.4 oz (83.6 kg)  02/22/18 180 lb (81.6 kg)    Physical Exam  Constitutional: She is oriented to person, place, and time. She appears well-developed and well-nourished. No distress.  HENT:  Head: Atraumatic.  Right Ear: External ear normal.  Left Ear: External ear normal.  Nose: Nose normal.  Oropharynx erythematous and edematous b/l  Eyes: Pupils are equal, round, and reactive to light. Conjunctivae and EOM are normal.  Neck: Normal range of motion. Neck supple.  Cardiovascular: Normal rate, regular rhythm and normal heart sounds.  Pulmonary/Chest: Effort normal and breath sounds normal.  Musculoskeletal: Normal range of motion.  Neurological: She is alert and oriented to person, place, and time.  Skin: Skin is warm and dry. No rash noted.  Psychiatric: She has a normal  mood and affect. Her behavior is normal.  Nursing note and vitals reviewed.   Results for orders placed or performed in visit on 03/31/18  HgB A1c  Result Value Ref Range   Hgb A1c MFr Bld 4.6 (L) 4.8 - 5.6 %   Est. average glucose Bld gHb Est-mCnc 85 mg/dL      Assessment & Plan:   Problem List Items Addressed This Visit    None    Visit Diagnoses    Strep pharyngitis    -  Primary   Rapid strep +. Tx with zpack d/t PCN allergy. Throat sprays, honey tea, throat drops. F/u if not improving   Relevant Medications   azithromycin (ZITHROMAX) 250 MG tablet   Other Relevant Orders   Rapid Strep screen([facility])       Follow up plan: Return for as scheduled.

## 2018-04-18 DIAGNOSIS — F411 Generalized anxiety disorder: Secondary | ICD-10-CM | POA: Diagnosis not present

## 2018-04-20 DIAGNOSIS — F411 Generalized anxiety disorder: Secondary | ICD-10-CM | POA: Diagnosis not present

## 2018-04-27 ENCOUNTER — Ambulatory Visit: Payer: Medicaid Other | Admitting: Dietician

## 2018-04-27 DIAGNOSIS — F411 Generalized anxiety disorder: Secondary | ICD-10-CM | POA: Diagnosis not present

## 2018-05-04 DIAGNOSIS — F411 Generalized anxiety disorder: Secondary | ICD-10-CM | POA: Diagnosis not present

## 2018-05-12 ENCOUNTER — Emergency Department
Admission: EM | Admit: 2018-05-12 | Discharge: 2018-05-12 | Disposition: A | Discharge status: Home / Self Care | Payer: Medicaid Other | Attending: Emergency Medicine | Admitting: Emergency Medicine

## 2018-05-12 ENCOUNTER — Emergency Department: Payer: Medicaid Other

## 2018-05-12 ENCOUNTER — Other Ambulatory Visit: Payer: Self-pay

## 2018-05-12 DIAGNOSIS — Y9389 Activity, other specified: Secondary | ICD-10-CM | POA: Diagnosis not present

## 2018-05-12 DIAGNOSIS — F319 Bipolar disorder, unspecified: Secondary | ICD-10-CM | POA: Insufficient documentation

## 2018-05-12 DIAGNOSIS — F419 Anxiety disorder, unspecified: Secondary | ICD-10-CM | POA: Diagnosis not present

## 2018-05-12 DIAGNOSIS — S40012A Contusion of left shoulder, initial encounter: Secondary | ICD-10-CM | POA: Insufficient documentation

## 2018-05-12 DIAGNOSIS — M419 Scoliosis, unspecified: Secondary | ICD-10-CM | POA: Diagnosis not present

## 2018-05-12 DIAGNOSIS — S4992XA Unspecified injury of left shoulder and upper arm, initial encounter: Secondary | ICD-10-CM | POA: Diagnosis not present

## 2018-05-12 DIAGNOSIS — M79602 Pain in left arm: Secondary | ICD-10-CM | POA: Diagnosis not present

## 2018-05-12 DIAGNOSIS — Z79899 Other long term (current) drug therapy: Secondary | ICD-10-CM | POA: Diagnosis not present

## 2018-05-12 DIAGNOSIS — Y929 Unspecified place or not applicable: Secondary | ICD-10-CM | POA: Diagnosis not present

## 2018-05-12 DIAGNOSIS — Y998 Other external cause status: Secondary | ICD-10-CM | POA: Insufficient documentation

## 2018-05-12 DIAGNOSIS — F411 Generalized anxiety disorder: Secondary | ICD-10-CM | POA: Diagnosis not present

## 2018-05-12 DIAGNOSIS — M25512 Pain in left shoulder: Secondary | ICD-10-CM | POA: Diagnosis not present

## 2018-05-12 MED ORDER — MELOXICAM 15 MG PO TABS: 15.0000 mg | ORAL_TABLET | Freq: Every day | ORAL | 0 refills | Status: DC
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
## 2018-05-13 ENCOUNTER — Other Ambulatory Visit: Payer: Self-pay

## 2018-05-13 ENCOUNTER — Ambulatory Visit (INDEPENDENT_AMBULATORY_CARE_PROVIDER_SITE_OTHER): Payer: Medicaid Other | Admitting: Family Medicine

## 2018-05-13 ENCOUNTER — Encounter: Payer: Self-pay | Admitting: Family Medicine

## 2018-05-13 VITALS — BP 113/75 | HR 73 | Temp 98.6°F | Ht 64.0 in | Wt 177.0 lb

## 2018-05-13 DIAGNOSIS — R300 Dysuria: Secondary | ICD-10-CM

## 2018-05-13 DIAGNOSIS — N3001 Acute cystitis with hematuria: Secondary | ICD-10-CM

## 2018-05-13 MED ORDER — NITROFURANTOIN MONOHYD MACRO 100 MG PO CAPS: 100.0000 mg | ORAL_CAPSULE | Freq: Two times a day (BID) | ORAL | 0 refills | Status: DC

## 2018-05-13 NOTE — Progress Notes (Signed)
BP 113/75   Pulse 73   Temp 98.6 F (37 C) (Oral)   Ht 5\' 4"  (1.626 m)   Wt 177 lb (80.3 kg)   SpO2 96%   BMI 30.38 kg/m    Subjective:    [name], female    DOB: 01/01/1991, 27 y.o.   MRN: [id number]  HPI: Alyssa Kemp is a 27 y.o. female  Chief Complaint  Patient presents with  . Urinary Frequency    pt states has had burning urination and cloudy, strong urine x 2 days ago   URINARY SYMPTOMS Duration: Wednesday Dysuria: burning Urinary frequency: yes Urgency: yes Small volume voids: yes Symptom severity: moderate Urinary incontinence: no Foul odor: no Hematuria: no Abdominal pain: yes Back pain: yes Suprapubic pain/pressure: yes Flank pain: no Fever:  no Vomiting: no Relief with cranberry juice: no Relief with pyridium: no Status: stable Previous urinary tract infection: yes Recurrent urinary tract infection: no Sexual activity: No sexually active/monogomous/practicing safe sex History of sexually transmitted disease: no Vaginal discharge: no Treatments attempted: pyridium    Relevant past medical, surgical, family and social history reviewed and updated as indicated. Interim medical history since our last visit reviewed. Allergies and medications reviewed and updated.  Review of Systems  Constitutional: Negative.   Respiratory: Negative.   Cardiovascular: Negative.   Genitourinary: Positive for dysuria, frequency, hematuria and urgency. Negative for decreased urine volume, difficulty urinating, dyspareunia, enuresis, flank pain, genital sores, menstrual problem, pelvic pain, vaginal bleeding, vaginal discharge and vaginal pain.  Musculoskeletal: Negative.   Neurological: Negative.   Psychiatric/Behavioral: Negative.     Per HPI unless specifically indicated above     Objective:    BP 113/75   Pulse 73   Temp 98.6 F (37 C) (Oral)   Ht 5\' 4"  (1.626 m)   Wt 177 lb (80.3 kg)   SpO2 96%   BMI 30.38 kg/m   Wt  Readings from Last 3 Encounters:  05/13/18 177 lb (80.3 kg)  05/12/18 175 lb (79.4 kg)  04/04/18 181 lb 9 oz (82.4 kg)    Physical Exam  Constitutional: She is oriented to person, place, and time. She appears well-developed and well-nourished. No distress.  HENT:  Head: Normocephalic and atraumatic.  Right Ear: Hearing normal.  Left Ear: Hearing normal.  Nose: Nose normal.  Eyes: Conjunctivae and lids are normal. Right eye exhibits no discharge. Left eye exhibits no discharge. No scleral icterus.  Cardiovascular: Normal rate, regular rhythm, normal heart sounds and intact distal pulses. Exam reveals no gallop and no friction rub.  No murmur heard. Pulmonary/Chest: Effort normal and breath sounds normal. No stridor. No respiratory distress. She has no wheezes. She has no rales. She exhibits no tenderness.  Musculoskeletal: Normal range of motion.  Neurological: She is alert and oriented to person, place, and time.  Skin: Skin is warm, dry and intact. Capillary refill takes less than 2 seconds. No rash noted. She is not diaphoretic. No erythema. No pallor.  Psychiatric: She has a normal mood and affect. Her speech is normal and behavior is normal. Judgment and thought content normal. Cognition and memory are normal.  Nursing note and vitals reviewed.   Results for orders placed or performed in visit on 04/04/18  Rapid Strep screen([facility])  Result Value Ref Range   Strep Gp A Ag, IA W/Reflex Positive (A) Negative      Assessment & Plan:   Problem List Items Addressed This Visit    None  Visit Diagnoses    Acute cystitis with hematuria    -  Primary   Will treat with macrobid. Call if not getting better or getting worse.    Dysuria       + UTI   Relevant Orders   UA/M w/rflx Culture, Routine   Urine Culture       Follow up plan: Return if symptoms worsen or fail to improve.

## 2018-05-15 LAB — URINE CULTURE

## 2018-05-16 LAB — URINE CULTURE, REFLEX

## 2018-05-17 LAB — UA/M W/RFLX CULTURE, ROUTINE
Nitrite, UA: POSITIVE — AB
PH UA: 5 (ref 5.0–7.5)
Specific Gravity, UA: 1.01 (ref 1.005–1.030)
Urobilinogen, Ur: >8 mg/dL — ABNORMAL HIGH (ref 0.2–1.0)

## 2018-05-17 LAB — MICROSCOPIC EXAMINATION

## 2018-05-18 DIAGNOSIS — F411 Generalized anxiety disorder: Secondary | ICD-10-CM | POA: Diagnosis not present

## 2018-05-23 DIAGNOSIS — F411 Generalized anxiety disorder: Secondary | ICD-10-CM | POA: Diagnosis not present

## 2018-05-25 ENCOUNTER — Telehealth: Payer: Self-pay | Admitting: Family Medicine

## 2018-05-25 NOTE — Telephone Encounter (Signed)
Called and scheduled patient an appointment for 05/27/18 with Fleet Contrasachel.

## 2018-05-25 NOTE — Telephone Encounter (Signed)
Needs OV   Copied from CRM [id number]. Topic: Referral - Request for Referral >> May 25, 2018  9:24 AM Jay SchlichterWeikart, Melissa J wrote: Has patient seen PCP for this complaint? Yes.   *If NO, is insurance requiring patient see PCP for this issue before PCP can refer them? Referral for which specialty: allergy testing for medication Preferred provider/office:  Reason for referral: allergies  Cb is [phone]

## 2018-05-26 DIAGNOSIS — F411 Generalized anxiety disorder: Secondary | ICD-10-CM | POA: Diagnosis not present

## 2018-05-27 ENCOUNTER — Ambulatory Visit: Payer: Self-pay | Admitting: Family Medicine

## 2018-06-02 DIAGNOSIS — F411 Generalized anxiety disorder: Secondary | ICD-10-CM | POA: Diagnosis not present

## 2018-06-06 ENCOUNTER — Telehealth: Payer: Self-pay | Admitting: Family Medicine

## 2018-06-06 NOTE — Telephone Encounter (Signed)
Seeing Alyssa Kemp on 12/18

## 2018-06-06 NOTE — Telephone Encounter (Signed)
Received CRM about dropping off a urine, please let her know she will need to schedule an appt for evaluation
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
## 2018-06-08 ENCOUNTER — Encounter: Payer: Self-pay | Admitting: Family Medicine

## 2018-06-08 ENCOUNTER — Ambulatory Visit (INDEPENDENT_AMBULATORY_CARE_PROVIDER_SITE_OTHER): Payer: Medicaid Other | Admitting: Family Medicine

## 2018-06-08 VITALS — BP 103/67 | HR 84 | Temp 97.5°F | Wt 174.0 lb

## 2018-06-08 DIAGNOSIS — Z113 Encounter for screening for infections with a predominantly sexual mode of transmission: Secondary | ICD-10-CM

## 2018-06-08 DIAGNOSIS — R350 Frequency of micturition: Secondary | ICD-10-CM

## 2018-06-08 DIAGNOSIS — R399 Unspecified symptoms and signs involving the genitourinary system: Secondary | ICD-10-CM | POA: Diagnosis not present

## 2018-06-08 DIAGNOSIS — Z889 Allergy status to unspecified drugs, medicaments and biological substances status: Secondary | ICD-10-CM | POA: Diagnosis not present

## 2018-06-08 DIAGNOSIS — R3915 Urgency of urination: Secondary | ICD-10-CM | POA: Diagnosis not present

## 2018-06-08 LAB — UA/M W/RFLX CULTURE, ROUTINE
Bilirubin, UA: NEGATIVE
GLUCOSE, UA: NEGATIVE
Ketones, UA: NEGATIVE
Leukocytes, UA: NEGATIVE
Nitrite, UA: NEGATIVE
PH UA: 6 (ref 5.0–7.5)
PROTEIN UA: NEGATIVE
Specific Gravity, UA: 1.025 (ref 1.005–1.030)
Urobilinogen, Ur: 0.2 mg/dL (ref 0.2–1.0)

## 2018-06-08 LAB — MICROSCOPIC EXAMINATION

## 2018-06-08 NOTE — Progress Notes (Signed)
BP 103/67   Pulse 84   Temp (!) 97.5 F (36.4 C) (Oral)   Wt 174 lb (78.9 kg)   SpO2 100%   BMI 29.87 kg/m    Subjective:    [name], female    DOB: [birth year], 27 y.o.   MRN: [id number]  HPI: Alyssa Kemp is a 27 y.o. female  Chief Complaint  Patient presents with  . Allergic Reaction    Would like referral to to allergist for allergy testing  . Urinary Frequency   Urinary urgency and cloudy urine x 5 days, improving since onset. Taking AZO with cranberry which seems to help. Frequent UTIs lately, recently completed macrobid about a month ago. Denies fevers, N/V/D, abdominal pain, new low back pain. Does want to be screened for STIs as she's been having frequent urinary issues lately. No known exposures.   Has had chest tightness and SOB on cipro and anecdotal reactions to other antibiotic in childhood. Also has had questionable reactions to cantaloupe but unsure if she's truly allergic. Wanting allergy testing for confirmation of her allergies.     Relevant past medical, surgical, family and social history reviewed and updated as indicated. Interim medical history since our last visit reviewed. Allergies and medications reviewed and updated.  Review of Systems  Per HPI unless specifically indicated above     Objective:    BP 103/67   Pulse 84   Temp (!) 97.5 F (36.4 C) (Oral)   Wt 174 lb (78.9 kg)   SpO2 100%   BMI 29.87 kg/m   Wt Readings from Last 3 Encounters:  06/08/18 174 lb (78.9 kg)  05/13/18 177 lb (80.3 kg)  05/12/18 175 lb (79.4 kg)    Physical Exam Vitals signs and nursing note reviewed.  Constitutional:      Appearance: Normal appearance. She is not ill-appearing.  HENT:     Head: Atraumatic.  Eyes:     Extraocular Movements: Extraocular movements intact.     Conjunctiva/sclera: Conjunctivae normal.  Neck:     Musculoskeletal: Normal range of motion and neck supple.  Cardiovascular:     Rate and Rhythm: Normal rate  and regular rhythm.     Heart sounds: Normal heart sounds.  Pulmonary:     Effort: Pulmonary effort is normal.     Breath sounds: Normal breath sounds.  Abdominal:     General: Bowel sounds are normal. There is no distension.     Palpations: Abdomen is soft.     Tenderness: There is no abdominal tenderness. There is no guarding.  Musculoskeletal: Normal range of motion.        General: No tenderness (No CVA ttp).  Skin:    General: Skin is warm and dry.  Neurological:     Mental Status: She is alert and oriented to person, place, and time.  Psychiatric:        Mood and Affect: Mood normal.        Thought Content: Thought content normal.        Judgment: Judgment normal.     Results for orders placed or performed in visit on 06/08/18  GC/Chlamydia Probe Amp  Result Value Ref Range   Chlamydia trachomatis, NAA Negative Negative   Neisseria gonorrhoeae by PCR Negative Negative  Microscopic Examination  Result Value Ref Range   WBC, UA 0-5 0 - 5 /hpf   RBC, UA 0-2 0 - 2 /hpf   Epithelial Cells (non renal) 0-10 0 -  10 /hpf   Renal Epithel, UA 0-10 (A) None seen /hpf   Mucus, UA Present Not Estab.   Bacteria, UA Few None seen/Few  UA/M w/rflx Culture, Routine  Result Value Ref Range   Specific Gravity, UA 1.025 1.005 - 1.030   pH, UA 6.0 5.0 - 7.5   Color, UA Yellow Yellow   Appearance Ur Cloudy (A) Clear   Leukocytes, UA Negative Negative   Protein, UA Negative Negative/Trace   Glucose, UA Negative Negative   Ketones, UA Negative Negative   RBC, UA 2+ (A) Negative   Bilirubin, UA Negative Negative   Urobilinogen, Ur 0.2 0.2 - 1.0 mg/dL   Nitrite, UA Negative Negative   Microscopic Examination See below:   HIV Antibody (routine testing w rflx)  Result Value Ref Range   HIV Screen 4th Generation wRfx Non Reactive Non Reactive  HSV(herpes simplex vrs) 1+2 ab-IgG  Result Value Ref Range   HSV 1 Glycoprotein G Ab, IgG <0.91 0.00 - 0.90 index   HSV 2 IgG, Type Spec <0.91  0.00 - 0.90 index  RPR  Result Value Ref Range   RPR Ser Ql Non Reactive Non Reactive      Assessment & Plan:   Problem List Items Addressed This Visit    None    Visit Diagnoses    Urinary frequency    -  Primary   U/A negative, push fluids, probiotics. F/u if worsening sxs for recheck   Relevant Orders   UA/M w/rflx Culture, Routine (Completed)   Routine screening for STI (sexually transmitted infection)       Relevant Orders   GC/Chlamydia Probe Amp (Completed)   HIV Antibody (routine testing w rflx) (Completed)   HSV(herpes simplex vrs) 1+2 ab-IgG (Completed)   RPR (Completed)   Urinary urgency       Relevant Orders   GC/Chlamydia Probe Amp (Completed)   Multiple drug allergies       Referral to allergist placed for testing per patient request   Relevant Orders   Ambulatory referral to Allergy       Follow up plan: Return for CPE.

## 2018-06-09 DIAGNOSIS — F411 Generalized anxiety disorder: Secondary | ICD-10-CM | POA: Diagnosis not present

## 2018-06-09 LAB — HSV(HERPES SIMPLEX VRS) I + II AB-IGG
HSV 1 Glycoprotein G Ab, IgG: <0.91 index (ref 0.00–0.90)
HSV 2 IgG, Type Spec: <0.91 index (ref 0.00–0.90)

## 2018-06-09 LAB — HIV ANTIBODY (ROUTINE TESTING W REFLEX): HIV Screen 4th Generation wRfx: NONREACTIVE

## 2018-06-09 LAB — RPR: RPR: NONREACTIVE

## 2018-06-10 LAB — GC/CHLAMYDIA PROBE AMP
Chlamydia trachomatis, NAA: NEGATIVE
Neisseria gonorrhoeae by PCR: NEGATIVE

## 2018-06-16 ENCOUNTER — Encounter: Payer: Medicaid Other | Admitting: Family Medicine

## 2018-06-17 DIAGNOSIS — F411 Generalized anxiety disorder: Secondary | ICD-10-CM | POA: Diagnosis not present

## 2018-06-20 DIAGNOSIS — F411 Generalized anxiety disorder: Secondary | ICD-10-CM | POA: Diagnosis not present

## 2018-06-27 ENCOUNTER — Encounter: Payer: Medicaid Other | Admitting: Family Medicine

## 2018-06-29 DIAGNOSIS — F411 Generalized anxiety disorder: Secondary | ICD-10-CM | POA: Diagnosis not present

## 2018-06-30 ENCOUNTER — Encounter: Payer: Self-pay | Admitting: Family Medicine

## 2018-07-08 ENCOUNTER — Encounter: Payer: Self-pay | Admitting: Family Medicine

## 2018-07-08 DIAGNOSIS — F411 Generalized anxiety disorder: Secondary | ICD-10-CM | POA: Diagnosis not present

## 2018-07-11 ENCOUNTER — Encounter: Payer: Self-pay | Admitting: Family Medicine

## 2018-07-11 ENCOUNTER — Ambulatory Visit (INDEPENDENT_AMBULATORY_CARE_PROVIDER_SITE_OTHER): Payer: Medicaid Other | Admitting: Family Medicine

## 2018-07-11 VITALS — BP 107/74 | HR 105 | Temp 98.5°F | Ht 64.4 in | Wt 165.6 lb

## 2018-07-11 DIAGNOSIS — Z0000 Encounter for general adult medical examination without abnormal findings: Secondary | ICD-10-CM | POA: Diagnosis not present

## 2018-07-11 LAB — UA/M W/RFLX CULTURE, ROUTINE
Bilirubin, UA: NEGATIVE
Glucose, UA: NEGATIVE
KETONES UA: NEGATIVE
Leukocytes, UA: NEGATIVE
NITRITE UA: NEGATIVE
Protein, UA: NEGATIVE
SPEC GRAV UA: 1.01 (ref 1.005–1.030)
Urobilinogen, Ur: 0.2 mg/dL (ref 0.2–1.0)
pH, UA: 7 (ref 5.0–7.5)

## 2018-07-11 LAB — MICROSCOPIC EXAMINATION

## 2018-07-11 NOTE — Progress Notes (Signed)
BP 107/74 (BP Location: Left Arm, Patient Position: Sitting, Cuff Size: Normal)   Pulse (!) 105   Temp 98.5 F (36.9 C)   Ht 5' 4.4" (1.636 m)   Wt 165 lb 9 oz (75.1 kg)   LMP 06/27/2018 (Approximate)   SpO2 100%   BMI 28.07 kg/m    Subjective:    [name], female    DOB: 1991-04-02, 28 y.o.   MRN: [id number]  HPI: Alyssa Kemp is a 28 y.o. female presenting on 07/11/2018 for comprehensive medical examination. Current medical complaints include:none  She currently lives with: Menopausal Symptoms: no  Depression Screen done today and results listed below:  Depression screen [facility] 2/9 07/11/2018 03/31/2018 07/31/2016  Decreased Interest 0 0 0  Down, Depressed, Hopeless 0 0 0  PHQ - 2 Score 0 0 0  Altered sleeping 0 0 -  Tired, decreased energy 0 0 -  Change in appetite 0 3 -  Feeling bad or failure about yourself  0 1 -  Trouble concentrating 3 0 -  Moving slowly or fidgety/restless 0 0 -  Suicidal thoughts - 0 -  PHQ-9 Score 3 4 -  Difficult doing work/chores Not difficult at all - -    The patient does not have a history of falls. I did not complete a risk assessment for falls. A plan of care for falls was not documented.   Past Medical History:  Past Medical History:  Diagnosis Date  . Bipolar depression (HCC)   . GERD (gastroesophageal reflux disease)   . Gestational diabetes   . Gestational diabetes mellitus (GDM) affecting pregnancy, antepartum 09/05/2016  . History of macrosomia in infant in prior pregnancy, currently pregnant   . 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 Scoliosis     Surgical History:  Past Surgical History:  Procedure Laterality Date  . ADENOIDECTOMY    . 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 CYSTOSCOPY W/ RETROGRADES Bilateral 12/28/2017   Procedure: CYSTOSCOPY WITH RETROGRADE PYELOGRAM;  Surgeon: [name], MD;  Location: [facility] ORS;  Service: Urology;  Laterality: Bilateral;  . TONSILLECTOMY    . TUBAL  LIGATION Bilateral 09/19/2016   Procedure: POST PARTUM TUBAL LIGATION;  Surgeon: [name], MD;  Location: [facility] OR[facility];  Service: Gynecology;  Laterality: Bilateral;  . WISDOM TOOTH EXTRACTION      Medications:  Current Outpatient Medications on File Prior to Visit  Medication Sig  . escitalopram (LEXAPRO) 20 MG tablet Take 20 mg by mouth daily.  Marland Kitchen omeprazole (PRILOSEC) 20 MG capsule Take 1 capsule (20 mg total) by mouth daily. (Patient taking differently: Take 20 mg by mouth as needed. )  . QUEtiapine (SEROQUEL) 50 MG tablet TK 1 T PO QHS   No current facility-administered medications on file prior to visit.     Allergies:  Allergies  Allergen Reactions  . Sulfa Antibiotics Shortness Of Breath and Other (See Comments)    Other Reaction: OTHER REACTION  . Sulfacetamide Sodium Shortness Of Breath  . Sulfasalazine Shortness Of Breath  . Doxycycline Swelling  . Cantaloupe (Diagnostic) Swelling  . Amoxicillin-Pot Clavulanate Other (See Comments)    Unknown- from childhood  . Penicillins Other (See Comments)    Unknown- from childhood    Social History:  Social History   Socioeconomic History  . Marital status: Single    Spouse name: Not on file  . Number of children: Not on file  . Years of education:  Not on file  . Highest education level: Not on file  Occupational History  . Not on file  Social Needs  . Financial resource strain: Not on file  . Food insecurity:    Worry: Not on file    Inability: Not on file  . Transportation needs:    Medical: Not on file    Non-medical: Not on file  Tobacco Use  . Smoking status: Never Smoker  . Smokeless tobacco: Never Used  Substance and Sexual Activity  . Alcohol use: No  . Drug use: No  . Sexual activity: Yes    Birth control/protection: None, Surgical  Lifestyle  . Physical activity:    Days per week: Not on file    Minutes per session: Not on file  . Stress: Not on file  Relationships  . Social connections:     Talks on phone: Not on file    Gets together: Not on file    Attends religious service: Not on file    Active member of club or organization: Not on file    Attends meetings of clubs or organizations: Not on file    Relationship status: Not on file  . Intimate partner violence:    Fear of current or ex partner: Not on file    Emotionally abused: Not on file    Physically abused: Not on file    Forced sexual activity: Not on file  Other Topics Concern  . Not on file  Social History Narrative  . Not on file   Social History   Tobacco Use  Smoking Status Never Smoker  Smokeless Tobacco Never Used   Social History   Substance and Sexual Activity  Alcohol Use No    Family History:  Family History  Problem Relation Age of Onset  . Cervical cancer Mother   . Ovarian cancer Mother   . Hypertension Maternal Grandmother   . Pancreatic cancer Maternal Grandmother   . Diabetes Maternal Grandmother   . Coronary artery disease Maternal Grandfather   . Congestive Heart Failure Maternal Grandfather   . Hypertension Maternal Grandfather   . Prostate cancer Maternal Grandfather   . Diabetes Maternal Grandfather   . Hypertension Paternal Grandmother   . Diabetes Paternal Grandmother   . Heart disease Paternal Grandfather   . Hypertension Paternal Grandfather   . Breast cancer Paternal Aunt     Past medical history, surgical history, medications, allergies, family history and social history reviewed with patient today and changes made to appropriate areas of the chart.   Review of Systems - General ROS: negative Psychological ROS: negative Ophthalmic ROS: negative ENT ROS: negative Allergy and Immunology ROS: negative Hematological and Lymphatic ROS: negative Endocrine ROS: negative Breast ROS: negative for breast lumps Respiratory ROS: no cough, shortness of breath, or wheezing Cardiovascular ROS: no chest pain or dyspnea on exertion Gastrointestinal ROS: no abdominal pain,  change in bowel habits, or black or bloody stools Genito-Urinary ROS: no dysuria, trouble voiding, or hematuria Musculoskeletal ROS: negative Neurological ROS: no TIA or stroke symptoms Dermatological ROS: negative All other ROS negative except what is listed above and in the HPI.      Objective:    BP 107/74 (BP Location: Left Arm, Patient Position: Sitting, Cuff Size: Normal)   Pulse (!) 105   Temp 98.5 F (36.9 C)   Ht 5' 4.4" (1.636 m)   Wt 165 lb 9 oz (75.1 kg)   LMP 06/27/2018 (Approximate)   SpO2 100%   BMI  28.07 kg/m   Wt Readings from Last 3 Encounters:  07/11/18 165 lb 9 oz (75.1 kg)  06/08/18 174 lb (78.9 kg)  05/13/18 177 lb (80.3 kg)    Physical Exam Vitals signs and nursing note reviewed.  Constitutional:      General: She is not in acute distress.    Appearance: She is well-developed.  HENT:     Head: Atraumatic.     Right Ear: External ear normal.     Left Ear: External ear normal.     Nose: Nose normal.     Mouth/Throat:     Pharynx: No oropharyngeal exudate.  Eyes:     General: No scleral icterus.    Conjunctiva/sclera: Conjunctivae normal.     Pupils: Pupils are equal, round, and reactive to light.  Neck:     Musculoskeletal: Normal range of motion and neck supple.     Thyroid: No thyromegaly.  Cardiovascular:     Rate and Rhythm: Normal rate and regular rhythm.     Heart sounds: Normal heart sounds.  Pulmonary:     Effort: Pulmonary effort is normal. No respiratory distress.     Breath sounds: Normal breath sounds.  Abdominal:     General: Bowel sounds are normal.     Palpations: Abdomen is soft. There is no mass.     Tenderness: There is no abdominal tenderness.  Musculoskeletal: Normal range of motion.        General: No tenderness.  Lymphadenopathy:     Cervical: No cervical adenopathy.  Skin:    General: Skin is warm and dry.     Findings: No rash.  Neurological:     Mental Status: She is alert and oriented to person, place, and  time.     Cranial Nerves: No cranial nerve deficit.  Psychiatric:        Behavior: Behavior normal.     Results for orders placed or performed in visit on 07/11/18  Microscopic Examination  Result Value Ref Range   RBC, UA 0-2 0 - 2 /hpf   Epithelial Cells (non renal) 0-10 0 - 10 /hpf  UA/M w/rflx Culture, Routine  Result Value Ref Range   Specific Gravity, UA 1.010 1.005 - 1.030   pH, UA 7.0 5.0 - 7.5   Color, UA Yellow Yellow   Appearance Ur Clear Clear   Leukocytes, UA Negative Negative   Protein, UA Negative Negative/Trace   Glucose, UA Negative Negative   Ketones, UA Negative Negative   RBC, UA Trace (A) Negative   Bilirubin, UA Negative Negative   Urobilinogen, Ur 0.2 0.2 - 1.0 mg/dL   Nitrite, UA Negative Negative   Microscopic Examination See below:       Assessment & Plan:   Problem List Items Addressed This Visit    None    Visit Diagnoses    Annual physical exam    -  Primary   Relevant Orders   CBC with Differential/Platelet   Comprehensive metabolic panel   Lipid Panel w/o Chol/HDL Ratio   TSH   UA/M w/rflx Culture, Routine (Completed)       Follow up plan: Return in about 1 year (around 07/12/2019) for CPE.   LABORATORY TESTING:  - Pap smear: up to date  IMMUNIZATIONS:   - Tdap: Tetanus vaccination status reviewed: last tetanus booster within 10 years. - Influenza: Refused  PATIENT COUNSELING:   Advised to take 1 mg of folate supplement per day if capable of pregnancy.   Sexuality:  Discussed sexually transmitted diseases, partner selection, use of condoms, avoidance of unintended pregnancy  and contraceptive alternatives.   Advised to avoid cigarette smoking.  I discussed with the patient that most people either abstain from alcohol or drink within safe limits (<=14/week and <=4 drinks/occasion for males, <=7/weeks and <= 3 drinks/occasion for females) and that the risk for alcohol disorders and other health effects rises proportionally with  the number of drinks per week and how often a drinker exceeds daily limits.  Discussed cessation/primary prevention of drug use and availability of treatment for abuse.   Diet: Encouraged to adjust caloric intake to maintain  or achieve ideal body weight, to reduce intake of dietary saturated fat and total fat, to limit sodium intake by avoiding high sodium foods and not adding table salt, and to maintain adequate dietary potassium and calcium preferably from fresh fruits, vegetables, and low-fat dairy products.    stressed the importance of regular exercise  Injury prevention: Discussed safety belts, safety helmets, smoke detector, smoking near bedding or upholstery.   Dental health: Discussed importance of regular tooth brushing, flossing, and dental visits.    NEXT PREVENTATIVE PHYSICAL DUE IN 1 YEAR. Return in about 1 year (around 07/12/2019) for CPE.

## 2018-07-12 LAB — LIPID PANEL W/O CHOL/HDL RATIO
CHOLESTEROL TOTAL: 156 mg/dL (ref 100–199)
HDL: 46 mg/dL (ref >39)
LDL Calculated: 84 mg/dL (ref 0–99)
TRIGLYCERIDES: 131 mg/dL (ref 0–149)
VLDL Cholesterol Cal: 26 mg/dL (ref 5–40)

## 2018-07-12 LAB — CBC WITH DIFFERENTIAL/PLATELET
Basophils Absolute: 0.1 10*3/uL (ref 0.0–0.2)
Basos: 1 %
EOS (ABSOLUTE): 0.5 10*3/uL — AB (ref 0.0–0.4)
Eos: 5 %
HEMOGLOBIN: 12.7 g/dL (ref 11.1–15.9)
Hematocrit: 37 % (ref 34.0–46.6)
Immature Grans (Abs): 0 10*3/uL (ref 0.0–0.1)
Immature Granulocytes: 0 %
Lymphocytes Absolute: 3.5 10*3/uL — ABNORMAL HIGH (ref 0.7–3.1)
Lymphs: 34 %
MCH: 30 pg (ref 26.6–33.0)
MCHC: 34.3 g/dL (ref 31.5–35.7)
MCV: 88 fL (ref 79–97)
MONOCYTES: 8 %
Monocytes Absolute: 0.9 10*3/uL (ref 0.1–0.9)
NEUTROS ABS: 5.5 10*3/uL (ref 1.4–7.0)
Neutrophils: 52 %
Platelets: 273 10*3/uL (ref 150–450)
RBC: 4.23 x10E6/uL (ref 3.77–5.28)
RDW: 13.1 % (ref 11.7–15.4)
WBC: 10.5 10*3/uL (ref 3.4–10.8)

## 2018-07-12 LAB — COMPREHENSIVE METABOLIC PANEL
ALT: 18 IU/L (ref 0–32)
AST: 20 IU/L (ref 0–40)
Albumin/Globulin Ratio: 2 (ref 1.2–2.2)
Albumin: 4.4 g/dL (ref 3.9–5.0)
Alkaline Phosphatase: 56 IU/L (ref 39–117)
BILIRUBIN TOTAL: 0.4 mg/dL (ref 0.0–1.2)
BUN/Creatinine Ratio: 19 (ref 9–23)
BUN: 13 mg/dL (ref 6–20)
CHLORIDE: 102 mmol/L (ref 96–106)
CO2: 23 mmol/L (ref 20–29)
Calcium: 9.6 mg/dL (ref 8.7–10.2)
Creatinine, Ser: 0.7 mg/dL (ref 0.57–1.00)
GFR calc Af Amer: 138 mL/min/{1.73_m2} (ref >59)
GFR calc non Af Amer: 120 mL/min/{1.73_m2} (ref >59)
Globulin, Total: 2.2 g/dL (ref 1.5–4.5)
Glucose: 71 mg/dL (ref 65–99)
Potassium: 4.5 mmol/L (ref 3.5–5.2)
SODIUM: 141 mmol/L (ref 134–144)
Total Protein: 6.6 g/dL (ref 6.0–8.5)

## 2018-07-12 LAB — TSH: TSH: 0.473 u[IU]/mL (ref 0.450–4.500)

## 2018-07-13 DIAGNOSIS — F411 Generalized anxiety disorder: Secondary | ICD-10-CM | POA: Diagnosis not present

## 2018-07-18 DIAGNOSIS — F411 Generalized anxiety disorder: Secondary | ICD-10-CM | POA: Diagnosis not present

## 2018-07-21 DIAGNOSIS — F411 Generalized anxiety disorder: Secondary | ICD-10-CM | POA: Diagnosis not present

## 2018-07-28 DIAGNOSIS — F411 Generalized anxiety disorder: Secondary | ICD-10-CM | POA: Diagnosis not present

## 2018-08-02 ENCOUNTER — Ambulatory Visit (INDEPENDENT_AMBULATORY_CARE_PROVIDER_SITE_OTHER): Payer: Medicaid Other | Admitting: Family Medicine

## 2018-08-02 ENCOUNTER — Encounter: Payer: Self-pay | Admitting: Family Medicine

## 2018-08-02 VITALS — BP 113/70 | HR 75 | Temp 98.6°F | Ht 64.0 in | Wt 176.9 lb

## 2018-08-02 DIAGNOSIS — B852 Pediculosis, unspecified: Secondary | ICD-10-CM | POA: Diagnosis not present

## 2018-08-02 DIAGNOSIS — R059 Cough, unspecified: Secondary | ICD-10-CM

## 2018-08-02 DIAGNOSIS — R05 Cough: Secondary | ICD-10-CM

## 2018-08-02 LAB — VERITOR FLU A/B WAIVED
Influenza A: NEGATIVE
Influenza B: NEGATIVE

## 2018-08-02 MED ORDER — HYDROCOD POLST-CPM POLST ER 10-8 MG/5ML PO SUER: 5.0000 mL | Freq: Every evening | ORAL | 0 refills | Status: DC | PRN

## 2018-08-02 MED ORDER — PERMETHRIN 1 % EX LIQD: Freq: Once | CUTANEOUS | 0 refills | Status: AC

## 2018-08-02 MED ORDER — PREDNISONE 20 MG PO TABS: 40.0000 mg | ORAL_TABLET | Freq: Every day | ORAL | 0 refills | Status: DC

## 2018-08-02 NOTE — Progress Notes (Signed)
BP 113/70 (BP Location: Left Arm, Patient Position: Sitting, Cuff Size: Normal)   Pulse 75   Temp 98.6 F (37 C) (Oral)   Ht 5\' 4"  (1.626 m)   Wt 176 lb 14.4 oz (80.2 kg)   SpO2 98%   BMI 30.36 kg/m    Subjective:    [name], female    DOB: 01/20/1991, 28 y.o.   MRN: [id number]  HPI: Alyssa Kemp is a 28 y.o. female  Chief Complaint  Patient presents with  . Cough    Cough has been ongoing appx 2 weeks, but symptoms are worsening.   . Nasal Congestion  . Headache  . Nausea  . Fatigue   Son had the flu 3 weeks ago and she shortly after started with congestion, cough, shortness of breath. Trying delsym and OTC pain relievers without relief. Denies consistent fevers, chills, body aches, N/V/D. No known hx of asthma or allergies. States the cough is the most bothersome lingering symptom.   Has head lice from a friend's child, tried OTC products without relief. Still having significant itching and irritation.   Past Medical History:  Diagnosis Date  . Bipolar depression (HCC)   . GERD (gastroesophageal reflux disease)   . Gestational diabetes   . Gestational diabetes mellitus (GDM) affecting pregnancy, antepartum 09/05/2016  . History of macrosomia in infant in prior pregnancy, currently pregnant   . 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 Scoliosis    Social History   Socioeconomic History  . Marital status: Single    Spouse name: Not on file  . Number of children: Not on file  . Years of education: Not on file  . Highest education level: Not on file  Occupational History  . Not on file  Social Needs  . Financial resource strain: Not on file  . Food insecurity:    Worry: Not on file    Inability: Not on file  . Transportation needs:    Medical: Not on file    Non-medical: Not on file  Tobacco Use  . Smoking status: Never Smoker  . Smokeless tobacco: Never Used  Substance and Sexual Activity  . Alcohol use: No  . Drug use: No  .  Sexual activity: Yes    Birth control/protection: None, Surgical  Lifestyle  . Physical activity:    Days per week: Not on file    Minutes per session: Not on file  . Stress: Not on file  Relationships  . Social connections:    Talks on phone: Not on file    Gets together: Not on file    Attends religious service: Not on file    Active member of club or organization: Not on file    Attends meetings of clubs or organizations: Not on file    Relationship status: Not on file  . Intimate partner violence:    Fear of current or ex partner: Not on file    Emotionally abused: Not on file    Physically abused: Not on file    Forced sexual activity: Not on file  Other Topics Concern  . Not on file  Social History Narrative  . Not on file    Relevant past medical, surgical, family and social history reviewed and updated as indicated. Interim medical history since our last visit reviewed. Allergies and medications reviewed and updated.  Review of Systems  Per HPI unless specifically indicated above     Objective:  BP 113/70 (BP Location: Left Arm, Patient Position: Sitting, Cuff Size: Normal)   Pulse 75   Temp 98.6 F (37 C) (Oral)   Ht 5\' 4"  (1.626 m)   Wt 176 lb 14.4 oz (80.2 kg)   SpO2 98%   BMI 30.36 kg/m   Wt Readings from Last 3 Encounters:  08/02/18 176 lb 14.4 oz (80.2 kg)  07/11/18 165 lb 9 oz (75.1 kg)  06/08/18 174 lb (78.9 kg)    Physical Exam Vitals signs and nursing note reviewed.  Constitutional:      Appearance: Normal appearance.  HENT:     Head: Atraumatic.     Right Ear: Tympanic membrane and external ear normal.     Left Ear: Tympanic membrane and external ear normal.     Nose: Congestion present.     Mouth/Throat:     Mouth: Mucous membranes are moist.     Pharynx: Posterior oropharyngeal erythema present.  Eyes:     Extraocular Movements: Extraocular movements intact.     Conjunctiva/sclera: Conjunctivae normal.  Neck:      Musculoskeletal: Normal range of motion and neck supple.  Cardiovascular:     Rate and Rhythm: Normal rate and regular rhythm.     Heart sounds: Normal heart sounds.  Pulmonary:     Effort: Pulmonary effort is normal.     Breath sounds: Normal breath sounds. No wheezing or rales.  Musculoskeletal: Normal range of motion.  Skin:    General: Skin is warm and dry.     Comments: Lice knits present on scalp  Neurological:     Mental Status: She is alert and oriented to person, place, and time.  Psychiatric:        Mood and Affect: Mood normal.        Thought Content: Thought content normal.     Results for orders placed or performed in visit on 08/02/18  Veritor Flu A/B Waived  Result Value Ref Range   Influenza A Negative Negative   Influenza B Negative Negative      Assessment & Plan:   Problem List Items Addressed This Visit    None    Visit Diagnoses    Cough    -  Primary   Tx with prednisone, delsym, tussionex at bedtime. Mucinex and humidifier prn. F/u if not improving   Relevant Orders   Veritor Flu A/B Waived (Completed)   Lice       Tx with permethrin cream. Discussed treating family members and household       Follow up plan: Return if symptoms worsen or fail to improve.

## 2018-08-03 DIAGNOSIS — F411 Generalized anxiety disorder: Secondary | ICD-10-CM | POA: Diagnosis not present

## 2018-08-05 DIAGNOSIS — J305 Allergic rhinitis due to food: Secondary | ICD-10-CM | POA: Diagnosis not present

## 2018-08-05 DIAGNOSIS — L560 Drug phototoxic response: Secondary | ICD-10-CM | POA: Diagnosis not present

## 2018-08-05 DIAGNOSIS — J301 Allergic rhinitis due to pollen: Secondary | ICD-10-CM | POA: Diagnosis not present

## 2018-08-05 DIAGNOSIS — J309 Allergic rhinitis, unspecified: Secondary | ICD-10-CM | POA: Diagnosis not present

## 2018-08-09 ENCOUNTER — Other Ambulatory Visit: Payer: Self-pay

## 2018-08-09 ENCOUNTER — Emergency Department: Payer: Medicaid Other

## 2018-08-09 ENCOUNTER — Emergency Department
Admission: EM | Admit: 2018-08-09 | Discharge: 2018-08-09 | Disposition: A | Discharge status: Home / Self Care | Payer: Medicaid Other | Attending: Emergency Medicine | Admitting: Emergency Medicine

## 2018-08-09 DIAGNOSIS — S52124A Nondisplaced fracture of head of right radius, initial encounter for closed fracture: Secondary | ICD-10-CM | POA: Diagnosis not present

## 2018-08-09 DIAGNOSIS — Y9351 Activity, roller skating (inline) and skateboarding: Secondary | ICD-10-CM | POA: Diagnosis not present

## 2018-08-09 DIAGNOSIS — Z79899 Other long term (current) drug therapy: Secondary | ICD-10-CM | POA: Diagnosis not present

## 2018-08-09 DIAGNOSIS — S52134A Nondisplaced fracture of neck of right radius, initial encounter for closed fracture: Secondary | ICD-10-CM | POA: Diagnosis not present

## 2018-08-09 DIAGNOSIS — F411 Generalized anxiety disorder: Secondary | ICD-10-CM | POA: Diagnosis not present

## 2018-08-09 DIAGNOSIS — S4991XA Unspecified injury of right shoulder and upper arm, initial encounter: Secondary | ICD-10-CM | POA: Diagnosis not present

## 2018-08-09 DIAGNOSIS — Y929 Unspecified place or not applicable: Secondary | ICD-10-CM | POA: Insufficient documentation

## 2018-08-09 DIAGNOSIS — S59901A Unspecified injury of right elbow, initial encounter: Secondary | ICD-10-CM | POA: Diagnosis present

## 2018-08-09 DIAGNOSIS — M25511 Pain in right shoulder: Secondary | ICD-10-CM | POA: Diagnosis not present

## 2018-08-09 DIAGNOSIS — Y999 Unspecified external cause status: Secondary | ICD-10-CM | POA: Diagnosis not present

## 2018-08-09 DIAGNOSIS — M79641 Pain in right hand: Secondary | ICD-10-CM | POA: Diagnosis not present

## 2018-08-09 DIAGNOSIS — S6991XA Unspecified injury of right wrist, hand and finger(s), initial encounter: Secondary | ICD-10-CM | POA: Diagnosis not present

## 2018-08-09 MED ORDER — HYDROCODONE-ACETAMINOPHEN 5-325 MG PO TABS: 1.0000 | ORAL_TABLET | ORAL | 0 refills | Status: DC | PRN

## 2018-08-09 MED ORDER — MELOXICAM 15 MG PO TABS: 15.0000 mg | ORAL_TABLET | Freq: Every day | ORAL | 0 refills | Status: DC

## 2018-08-09 MED ORDER — HYDROCODONE-ACETAMINOPHEN 5-325 MG PO TABS
1.0000 | ORAL_TABLET | Freq: Once | ORAL | Status: AC
  Administered 2018-08-09: 1 via ORAL
  Filled 2018-08-09: qty 1

## 2018-08-09 NOTE — ED Provider Notes (Signed)
[facility] Emergency Department Provider Note  ____________________________________________  Time seen: Approximately 10:02 PM  I have reviewed the triage vital signs and the nursing notes.   HISTORY  Chief Complaint Arm Injury    HPI Alyssa Kemp is a 28 y.o. female who presents the emergency department complaining of right elbow pain and right arm pain after a fall tonight.  Patient reports that she was skateboarding when she caught a rock, falling on her right arm.  Patient reports that she initially tried to catch herself but ended up landing on her elbow.  Patient reports majority of pain in her elbow but does have pain radiating down the left arm.  Patient did not hit her head or lose consciousness.  Patient is able to move the arm somewhat but does have limited range of motion.  No radicular symptoms.  No other complaints at this time.    Past Medical History:  Diagnosis Date  . Bipolar depression (HCC)   . GERD (gastroesophageal reflux disease)   . Gestational diabetes   . Gestational diabetes mellitus (GDM) affecting pregnancy, antepartum 09/05/2016  . History of macrosomia in infant in prior pregnancy, currently pregnant   . 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 Scoliosis     Patient Active Problem List   Diagnosis Date Noted  . Obesity (BMI 30.0-34.9) 03/31/2018  . Chlamydia 02/15/2017  . Encounter for sterilization 09/19/2016  . Normal labor and delivery 09/18/2016  . Labor and delivery indication for care or intervention 09/14/2016  . High risk pregnancy, antepartum 09/05/2016  . Bipolar 1 disorder (HCC) 09/05/2016  . Rh negative state in antepartum period 09/05/2016  . GERD (gastroesophageal reflux disease) 08/28/2016  . 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 Anxiety, generalized 11/05/2015  . 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/05/2015  . History of chlamydia infection 11/05/2015  . History of  abnormal Pap smear 02/21/2013    Past Surgical History:  Procedure Laterality Date  . ADENOIDECTOMY    . 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 CYSTOSCOPY W/ RETROGRADES Bilateral 12/28/2017   Procedure: CYSTOSCOPY WITH RETROGRADE PYELOGRAM;  Surgeon: Riki AltesStoioff, Scott C, MD;  Location: [facility] ORS;  Service: Urology;  Laterality: Bilateral;  . TONSILLECTOMY    . TUBAL LIGATION Bilateral 09/19/2016   Procedure: POST PARTUM TUBAL LIGATION;  Surgeon: Conard NovakStephen D Jackson, MD;  Location: [facility] ORS;  Service: Gynecology;  Laterality: Bilateral;  . WISDOM TOOTH EXTRACTION      Prior to Admission medications   Medication Sig Start Date End Date Taking? Authorizing Provider  chlorpheniramine-HYDROcodone (TUSSIONEX PENNKINETIC ER) 10-8 MG/5ML SUER Take 5 mLs by mouth at bedtime as needed. 08/02/18   Particia NearingLane, Rachel Elizabeth, PA-C  escitalopram (LEXAPRO) 20 MG tablet Take 20 mg by mouth daily.    [provider]  HYDROcodone-acetaminophen (NORCO/VICODIN) 5-325 MG tablet Take 1 tablet by mouth every 4 (four) hours as needed for moderate pain. 08/09/18   Tagen Milby, Delorise RoyalsJonathan D, PA-C  meloxicam (MOBIC) 15 MG tablet Take 1 tablet (15 mg total) by mouth daily. 08/09/18   Ha Placeres, Delorise RoyalsJonathan D, PA-C  omeprazole (PRILOSEC) 20 MG capsule Take 1 capsule (20 mg total) by mouth daily. Patient taking differently: Take 20 mg by mouth as needed.  10/19/17   Particia NearingLane, Rachel Elizabeth, PA-C  predniSONE (DELTASONE) 20 MG tablet Take 2 tablets (40 mg total) by mouth daily with breakfast. 08/02/18   Particia NearingLane, Rachel Elizabeth, PA-C  QUEtiapine (SEROQUEL) 50 MG tablet  TK 1 T PO QHS 04/24/18   [provider]    Allergies Sulfacetamide sodium; Sulfasalazine; Doxycycline; Cantaloupe (diagnostic); and Amoxicillin-pot clavulanate  Family History  Problem Relation Age of Onset  . Cervical cancer Mother   . Ovarian cancer Mother   . Hypertension Maternal Grandmother   . Pancreatic cancer Maternal Grandmother   . Diabetes Maternal  Grandmother   . Coronary artery disease Maternal Grandfather   . Congestive Heart Failure Maternal Grandfather   . Hypertension Maternal Grandfather   . Prostate cancer Maternal Grandfather   . Diabetes Maternal Grandfather   . Hypertension Paternal Grandmother   . Diabetes Paternal Grandmother   . Heart disease Paternal Grandfather   . Hypertension Paternal Grandfather   . Breast cancer Paternal Aunt     Social History Social History   Tobacco Use  . Smoking status: Never Smoker  . Smokeless tobacco: Never Used  Substance Use Topics  . Alcohol use: No  . Drug use: No     Review of Systems  Constitutional: No fever/chills Eyes: No visual changes.  Cardiovascular: no chest pain. Respiratory: no cough. No SOB. Gastrointestinal: No abdominal pain.  No nausea, no vomiting.   Musculoskeletal: Positive for right arm pain and injury Skin: Negative for rash, abrasions, lacerations, ecchymosis. Neurological: Negative for headaches, focal weakness or numbness. 10-point ROS otherwise negative.  ____________________________________________   PHYSICAL EXAM:  VITAL SIGNS: ED Triage Vitals  Enc Vitals Group     BP 08/09/18 2058 128/81     Pulse Rate 08/09/18 2058 74     Resp 08/09/18 2058 18     Temp 08/09/18 2058 98.7 F (37.1 C)     Temp Source 08/09/18 2058 Oral     SpO2 08/09/18 2058 99 %     Weight 08/09/18 2059 175 lb (79.4 kg)     Height 08/09/18 2059 5\' 4"  (1.626 m)     Head Circumference --      Peak Flow --      Pain Score 08/09/18 2058 8     Pain Loc --      Pain Edu? --      Excl. in GC? --      Constitutional: Alert and oriented. Well appearing and in no acute distress. Eyes: Conjunctivae are normal. PERRL. EOMI. Head: Atraumatic. Neck: No stridor.    Cardiovascular: Normal rate, regular rhythm. Normal S1 and S2.  Good peripheral circulation. Respiratory: Normal respiratory effort without tachypnea or retractions. Lungs CTAB. Good air entry to the  bases with no decreased or absent breath sounds. Musculoskeletal: Full range of motion to all extremities. No gross deformities appreciated.  Visualization of the right upper extremity reveals no gross deformity.  Mild edema of the elbow when compared with unaffected extremity.  Patient has decent range of motion to the shoulder and wrist but has limited range of motion in regards to supination and pronation of the elbow joint.  Patient is very tender to palpation of the radial head and olecranon process.  No aggressive tenderness to palpation over the osseous structures of the right arm.  Radial pulse intact distally.  Sensation intact all digits distally. Neurologic:  Normal speech and language. No gross focal neurologic deficits are appreciated.  Skin:  Skin is warm, dry and intact. No rash noted. Psychiatric: Mood and affect are normal. Speech and behavior are normal. Patient exhibits appropriate insight and judgement.   ____________________________________________   LABS (all labs ordered are listed, but only abnormal results are displayed)  Labs Reviewed - No data to display ____________________________________________  EKG   ____________________________________________  RADIOLOGY I personally viewed and evaluated these images as part of my medical decision making, as well as reviewing the written report by the radiologist.  I concur with radiologist finding of nondisplaced radial head fracture.  Dg Shoulder Right  Result Date: 08/09/2018 CLINICAL DATA:  Pain after skateboarding injury. EXAM: RIGHT SHOULDER - 2+ VIEW COMPARISON:  None. FINDINGS: There is no evidence of fracture or dislocation. There is no evidence of arthropathy or other focal bone abnormality. Soft tissues are unremarkable. IMPRESSION: Negative. Electronically Signed   By: Tollie Eth M.D.   On: 08/09/2018 21:33   Dg Elbow Complete Right  Result Date: 08/09/2018 CLINICAL DATA:  Pain after skateboarding injury.  EXAM: RIGHT ELBOW - COMPLETE 3+ VIEW COMPARISON:  None. FINDINGS: Acute nondisplaced fractures of the radial head and neck are identified with elbow joint effusion. No significant displacement or depression of the radius is noted. No joint dislocation is seen. IMPRESSION: Acute nondisplaced fractures of the radial head and neck with elbow joint effusion. Electronically Signed   By: Tollie Eth M.D.   On: 08/09/2018 21:33   Dg Hand Complete Right  Result Date: 08/09/2018 CLINICAL DATA:  Right shoulder, arm and hand pain after fall while skateboarding. EXAM: RIGHT HAND - COMPLETE 3+ VIEW COMPARISON:  None. FINDINGS: There is no evidence of fracture or dislocation. There is no evidence of arthropathy or other focal bone abnormality. Soft tissues are unremarkable. IMPRESSION: Negative. Electronically Signed   By: Tollie Eth M.D.   On: 08/09/2018 21:32    ____________________________________________    PROCEDURES  Procedure(s) performed:    .Splint Application Date/Time: 08/09/2018 10:22 PM Performed by: Racheal Patches, PA-C Authorized by: Racheal Patches, PA-C   Consent:    Consent obtained:  Verbal   Consent given by:  Patient   Risks discussed:  Pain and swelling Pre-procedure details:    Sensation:  Normal Procedure details:    Laterality:  Right   Location:  Elbow   Elbow:  R elbow   Splint type:  Long arm   Supplies:  Sling Post-procedure details:    Pain:  Improved   Sensation:  Normal   Patient tolerance of procedure:  Tolerated well, no immediate complications      Medications  HYDROcodone-acetaminophen (NORCO/VICODIN) 5-325 MG per tablet 1 tablet (1 tablet Oral Given 08/09/18 2213)     ____________________________________________   INITIAL IMPRESSION / ASSESSMENT AND PLAN / ED COURSE  Pertinent labs & imaging results that were available during my care of the patient were reviewed by me and considered in my medical decision making (see chart for  details).  Review of the [place] CSRS was performed in accordance of the [facility] prior to dispensing any controlled drugs.      Patient's diagnosis is consistent with radial head fracture.  Patient presents the emergency department with complaint of right elbow pain after skateboard accident.  On exam, patient is tender to palpation over the right elbow.  Findings on x-ray are consistent with radial head fracture.  Patient's elbow is splinted as described above.  Patient will be given prescription for meloxicam and limited prescription for Vicodin for pain.  Follow-up with orthopedics.. Patient is given ED precautions to return to the ED for any worsening or new symptoms.     ____________________________________________  FINAL CLINICAL IMPRESSION(S) / ED DIAGNOSES  Final diagnoses:  Closed nondisplaced fracture of head of right radius, initial  encounter      NEW MEDICATIONS STARTED DURING THIS VISIT:  ED Discharge Orders         Ordered    meloxicam (MOBIC) 15 MG tablet  Daily     08/09/18 2206    HYDROcodone-acetaminophen (NORCO/VICODIN) 5-325 MG tablet  Every 4 hours PRN     08/09/18 2206              This chart was dictated using voice recognition software/Dragon. Despite best efforts to proofread, errors can occur which can change the meaning. Any change was purely unintentional.    Racheal PatchesCuthriell, Eschol Auxier D, PA-C 08/09/18 2226    Jeanmarie PlantMcShane, James A, MD 08/09/18 2229

## 2018-08-09 NOTE — ED Triage Notes (Signed)
Was skateboarding and fell on to R arm. C/o R hand pain. Abrasion noted to R elbow. States painful to lift arm up at shoulder. A&O, ambulatory.

## 2018-08-09 NOTE — ED Notes (Signed)
NAD noted at time of D/C. Pt denies questions or concerns. Pt ambulatory to the lobby at this time.  

## 2018-08-12 DIAGNOSIS — S52124A Nondisplaced fracture of head of right radius, initial encounter for closed fracture: Secondary | ICD-10-CM | POA: Diagnosis not present

## 2018-08-15 ENCOUNTER — Encounter: Payer: Self-pay | Admitting: Family Medicine

## 2018-08-15 ENCOUNTER — Ambulatory Visit (INDEPENDENT_AMBULATORY_CARE_PROVIDER_SITE_OTHER): Payer: Medicaid Other | Admitting: Family Medicine

## 2018-08-15 VITALS — BP 132/73 | HR 85 | Temp 98.3°F | Ht 64.0 in | Wt 172.5 lb

## 2018-08-15 DIAGNOSIS — R509 Fever, unspecified: Secondary | ICD-10-CM

## 2018-08-15 DIAGNOSIS — J069 Acute upper respiratory infection, unspecified: Secondary | ICD-10-CM | POA: Diagnosis not present

## 2018-08-15 LAB — VERITOR FLU A/B WAIVED
Influenza A: NEGATIVE
Influenza B: NEGATIVE

## 2018-08-15 MED ORDER — HYDROCOD POLST-CPM POLST ER 10-8 MG/5ML PO SUER: 5.0000 mL | Freq: Every evening | ORAL | 0 refills | Status: DC | PRN

## 2018-08-15 MED ORDER — AZITHROMYCIN 250 MG PO TABS: ORAL_TABLET | ORAL | 0 refills | Status: DC

## 2018-08-15 NOTE — Progress Notes (Signed)
BP 132/73 (BP Location: Left Arm, Patient Position: Sitting, Cuff Size: Normal)   Pulse 85   Temp 98.3 F (36.8 C)   Ht 5\' 4"  (1.626 m)   Wt 172 lb 8 oz (78.2 kg)   LMP 07/19/2018   SpO2 98%   BMI 29.61 kg/m    Subjective:    [name], female    DOB: Oct 25, 1990, 28 y.o.   MRN: [id number]  HPI: Alyssa Kemp is a 28 y.o. female  Chief Complaint  Patient presents with  . URI    x 2 days, cough, fever bodyaches   2 day history of cough, fever, body aches, chills, sweats, congestion. The cough is the most bothersome symptom now. Taking tylenol cold and flu with no relief. Several sick contacts with flu. Has been sick with URI sxs for 2-3 weeks now, not improved on prednisone and cough medicine last week. Denies CP, SOB, N/V/D.   Relevant past medical, surgical, family and social history reviewed and updated as indicated. Interim medical history since our last visit reviewed. Allergies and medications reviewed and updated.  Review of Systems  Per HPI unless specifically indicated above     Objective:    BP 132/73 (BP Location: Left Arm, Patient Position: Sitting, Cuff Size: Normal)   Pulse 85   Temp 98.3 F (36.8 C)   Ht 5\' 4"  (1.626 m)   Wt 172 lb 8 oz (78.2 kg)   LMP 07/19/2018   SpO2 98%   BMI 29.61 kg/m   Wt Readings from Last 3 Encounters:  08/15/18 172 lb 8 oz (78.2 kg)  08/09/18 175 lb (79.4 kg)  08/02/18 176 lb 14.4 oz (80.2 kg)    Physical Exam Vitals signs and nursing note reviewed.  Constitutional:      Appearance: Normal appearance.  HENT:     Head: Atraumatic.     Right Ear: Tympanic membrane and external ear normal.     Left Ear: Tympanic membrane and external ear normal.     Nose: Congestion present.     Mouth/Throat:     Mouth: Mucous membranes are moist.     Pharynx: Posterior oropharyngeal erythema present.  Eyes:     Extraocular Movements: Extraocular movements intact.     Conjunctiva/sclera: Conjunctivae normal.    Neck:     Musculoskeletal: Normal range of motion and neck supple.  Cardiovascular:     Rate and Rhythm: Normal rate and regular rhythm.     Heart sounds: Normal heart sounds.  Pulmonary:     Effort: Pulmonary effort is normal.     Breath sounds: Normal breath sounds. No wheezing or rales.  Musculoskeletal: Normal range of motion.  Skin:    General: Skin is warm and dry.  Neurological:     Mental Status: She is alert and oriented to person, place, and time.  Psychiatric:        Mood and Affect: Mood normal.        Thought Content: Thought content normal.     Results for orders placed or performed in visit on 08/15/18  Veritor Flu A/B Waived  Result Value Ref Range   Influenza A Negative Negative   Influenza B Negative Negative      Assessment & Plan:   Problem List Items Addressed This Visit    None    Visit Diagnoses    Upper respiratory tract infection, unspecified type    -  Primary   Present for several weeks,  not improved with prednisone and OTC remedies. Tx with zpak, tussionex. Sedation and return precautions reviewed   Relevant Medications   azithromycin (ZITHROMAX) 250 MG tablet   Fever, unspecified fever cause       Rapid flu neg. Sypmtomatic tx reviewed. Medications started for URI   Relevant Orders   Veritor Flu A/B Waived (Completed)       Follow up plan: Return if symptoms worsen or fail to improve.

## 2018-08-19 DIAGNOSIS — F411 Generalized anxiety disorder: Secondary | ICD-10-CM | POA: Diagnosis not present

## 2018-08-29 ENCOUNTER — Emergency Department
Admission: EM | Admit: 2018-08-29 | Discharge: 2018-08-29 | Disposition: A | Discharge status: Home / Self Care | Payer: Medicaid Other | Attending: Dermatology | Admitting: Dermatology

## 2018-08-29 ENCOUNTER — Encounter: Payer: Self-pay | Admitting: Emergency Medicine

## 2018-08-29 ENCOUNTER — Other Ambulatory Visit: Payer: Self-pay

## 2018-08-29 DIAGNOSIS — M545 Low back pain, unspecified: Secondary | ICD-10-CM

## 2018-08-29 DIAGNOSIS — Z79899 Other long term (current) drug therapy: Secondary | ICD-10-CM | POA: Insufficient documentation

## 2018-08-29 DIAGNOSIS — M549 Dorsalgia, unspecified: Secondary | ICD-10-CM | POA: Diagnosis present

## 2018-08-29 MED ORDER — KETOROLAC TROMETHAMINE 30 MG/ML IJ SOLN
30.0000 mg | Freq: Once | INTRAMUSCULAR | Status: AC
  Administered 2018-08-29: 30 mg via INTRAMUSCULAR
  Filled 2018-08-29: qty 1

## 2018-08-29 MED ORDER — DIAZEPAM 2 MG PO TABS
2.0000 mg | ORAL_TABLET | Freq: Once | ORAL | Status: AC
  Administered 2018-08-29: 2 mg via ORAL
  Filled 2018-08-29: qty 1

## 2018-08-29 MED ORDER — KETOROLAC TROMETHAMINE 10 MG PO TABS: 10.0000 mg | ORAL_TABLET | Freq: Four times a day (QID) | ORAL | 0 refills | Status: DC | PRN

## 2018-08-29 MED ORDER — DIAZEPAM 2 MG PO TABS: 2.0000 mg | ORAL_TABLET | Freq: Three times a day (TID) | ORAL | 0 refills | Status: DC | PRN

## 2018-08-29 NOTE — ED Triage Notes (Signed)
Pt to ED via POV c/o lower back pain since this morning. Pt appears uncomfortable. Pt states that she has hx/o bulging disc in her back. Pt denies loss of bowel or bladder control .

## 2018-08-29 NOTE — Discharge Instructions (Signed)
See your primary care provider if not improving over the next few days.  Return to the ER for symptoms that change or worsen if unable to schedule an appointment.

## 2018-08-29 NOTE — ED Notes (Signed)
See triage note  Presents with lower back pain  States pain became worse this am  And is moving into pelvis  Hx of same  Denies any injury  But appears very uncomfortable  Pacing in room

## 2018-08-29 NOTE — ED Provider Notes (Signed)
[facility] Emergency Department Provider Note ____________________________________________  Time seen: Approximately 5:46 PM  I have reviewed the triage vital signs and the nursing notes.  HISTORY  Chief Complaint Back Pain   HPI Alyssa Kemp is a 28 y.o. female who presents to the ER for back pain. Acute on chronic pain that started this morning. She states that she has taken ibuprofen without relief. She denies injury. She has had similar symptoms in the past that has been relieved with a "anti-inflammatory shot and pain medication."   Past Medical History:  Diagnosis Date  . Bipolar depression (HCC)   . GERD (gastroesophageal reflux disease)   . Gestational diabetes   . Gestational diabetes mellitus (GDM) affecting pregnancy, antepartum 09/05/2016  . History of macrosomia in infant in prior pregnancy, currently pregnant   . 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 Scoliosis     Patient Active Problem List   Diagnosis Date Noted  . Obesity (BMI 30.0-34.9) 03/31/2018  . Chlamydia 02/15/2017  . Encounter for sterilization 09/19/2016  . Normal labor and delivery 09/18/2016  . Labor and delivery indication for care or intervention 09/14/2016  . High risk pregnancy, antepartum 09/05/2016  . Bipolar 1 disorder (HCC) 09/05/2016  . Rh negative state in antepartum period 09/05/2016  . GERD (gastroesophageal reflux disease) 08/28/2016  . 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 Anxiety, generalized 11/05/2015  . 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/05/2015  . History of chlamydia infection 11/05/2015  . History of abnormal Pap smear 02/21/2013    Past Surgical History:  Procedure Laterality Date  . ADENOIDECTOMY    . 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 CYSTOSCOPY W/ RETROGRADES Bilateral 12/28/2017   Procedure: CYSTOSCOPY WITH RETROGRADE PYELOGRAM;  Surgeon: Riki AltesStoioff, Scott C, MD;  Location: [facility] ORS;  Service: Urology;   Laterality: Bilateral;  . TONSILLECTOMY    . TUBAL LIGATION Bilateral 09/19/2016   Procedure: POST PARTUM TUBAL LIGATION;  Surgeon: Conard NovakStephen D Jackson, MD;  Location: [facility] ORS;  Service: Gynecology;  Laterality: Bilateral;  . WISDOM TOOTH EXTRACTION      Prior to Admission medications   Medication Sig Start Date End Date Taking? Authorizing Provider  diazepam (VALIUM) 2 MG tablet Take 1 tablet (2 mg total) by mouth every 8 (eight) hours as needed. 08/29/18   Madisyn Mawhinney B, FNP  escitalopram (LEXAPRO) 20 MG tablet Take 20 mg by mouth daily.    [provider]  ketorolac (TORADOL) 10 MG tablet Take 1 tablet (10 mg total) by mouth every 6 (six) hours as needed. 08/29/18   Kaelene Elliston, Rulon Eisenmengerari B, FNP  omeprazole (PRILOSEC) 20 MG capsule Take 1 capsule (20 mg total) by mouth daily. Patient taking differently: Take 20 mg by mouth as needed.  10/19/17   Particia NearingLane, Rachel Elizabeth, PA-C  QUEtiapine (SEROQUEL) 50 MG tablet TK 1 T PO QHS 04/24/18   [provider]    Allergies Sulfacetamide sodium; Sulfasalazine; Doxycycline; Cantaloupe (diagnostic); and Amoxicillin-pot clavulanate  Family History  Problem Relation Age of Onset  . Cervical cancer Mother   . Ovarian cancer Mother   . Hypertension Maternal Grandmother   . Pancreatic cancer Maternal Grandmother   . Diabetes Maternal Grandmother   . Coronary artery disease Maternal Grandfather   . Congestive Heart Failure Maternal Grandfather   . Hypertension Maternal Grandfather   . Prostate cancer Maternal Grandfather   . Diabetes Maternal Grandfather   . Hypertension Paternal Grandmother   .  Diabetes Paternal Grandmother   . Heart disease Paternal Grandfather   . Hypertension Paternal Grandfather   . Breast cancer Paternal Aunt     Social History Social History   Tobacco Use  . Smoking status: Never Smoker  . Smokeless tobacco: Never Used  Substance Use Topics  . Alcohol use: No  . Drug use: No    Review of  Systems Constitutional: Well appearing. Respiratory: Negative for dyspnea. Cardiovascular: Negative for change in skin temperature or color. Musculoskeletal:   Negative for chronic steroid use   Negative for trauma in the presence of osteoporosis  Negative for age over 81 and trauma.  Negative for constitutional symptoms, or history of cancer   Negative for pain worse at night. Skin: Negative for rash, lesion, or wound.  Genitourinary: Negative for urinary retention. Rectal: Negative for fecal incontinence or new onset constipation/bowel habit changes. Hematological/Immunilogical: Negative for immunosuppression, IV drug use, or fever Neurological: Negative for burning, tingling, numb, electric, radiating pain in the bilateral legs.                        Negative for saddle anesthesia.                        Negative for focal neurologic deficit, progressive or disabling symptoms             Negative for saddle anesthesia. ____________________________________________   PHYSICAL EXAM:  VITAL SIGNS: ED Triage Vitals [08/29/18 1331]  Enc Vitals Group     BP 127/68     Pulse Rate 78     Resp 18     Temp 97.8 F (36.6 C)     Temp Source Oral     SpO2 98 %     Weight      Height      Head Circumference      Peak Flow      Pain Score 9     Pain Loc      Pain Edu?      Excl. in GC?     Constitutional: Alert and oriented. Well appearing and in no acute distress. Eyes: Conjunctivae are clear without discharge or drainage.  Head: Atraumatic. Neck: Full, active range of motion. Respiratory: Respirations even and unlabored. Musculoskeletal: Full ROM of the back and extremities, Strength 5/5 of the lower extremities as tested. Neurologic: Reflexes of the lower extremities are 2+. Negative straight leg raise on the right or left side. Skin: Atraumatic.  Psychiatric: Behavior and affect are normal.  ____________________________________________   LABS (all labs ordered are  listed, but only abnormal results are displayed)  Labs Reviewed - No data to display ____________________________________________  RADIOLOGY  Not indicated. ____________________________________________   PROCEDURES  Procedure(s) performed:  Procedures ____________________________________________   INITIAL IMPRESSION / ASSESSMENT AND PLAN / ED COURSE  Alyssa Kemp is a 28 y.o. female who presents to the emergency department for treatment of acute on chronic back pain. While here, she has been pacing and hyperventilating. After receiving her Toradol injection and Valium tablet, she requested her discharge papers so that she can pick up her daughter. She was advised to follow up with primary care provider of her choice for symptoms that change or worsen. She is to return to the ER for symptoms that change or worsen.  Medications  ketorolac (TORADOL) 30 MG/ML injection 30 mg (30 mg Intramuscular Given 08/29/18 1437)  diazepam (VALIUM) tablet 2 mg (2  mg Oral Given 08/29/18 1437)    ED Discharge Orders         Ordered    ketorolac (TORADOL) 10 MG tablet  Every 6 hours PRN     08/29/18 1454    diazepam (VALIUM) 2 MG tablet  Every 8 hours PRN     08/29/18 1454           Pertinent labs & imaging results that were available during my care of the patient were reviewed by me and considered in my medical decision making (see chart for details).  _________________________________________   FINAL CLINICAL IMPRESSION(S) / ED DIAGNOSES  Final diagnoses:  Acute bilateral low back pain without sciatica     If controlled substance prescribed during this visit, 12 month history viewed on the [place] prior to issuing an initial prescription for Schedule II or III opiod.   Chinita Pester, FNP 08/29/18 1752    Jeanmarie Plant, MD 08/29/18 2000

## 2018-09-03 ENCOUNTER — Other Ambulatory Visit: Payer: Self-pay | Admitting: Family Medicine

## 2018-09-05 ENCOUNTER — Other Ambulatory Visit: Payer: Self-pay | Admitting: Family Medicine

## 2018-09-05 DIAGNOSIS — M654 Radial styloid tenosynovitis [de Quervain]: Secondary | ICD-10-CM | POA: Diagnosis not present

## 2018-09-05 DIAGNOSIS — S52124D Nondisplaced fracture of head of right radius, subsequent encounter for closed fracture with routine healing: Secondary | ICD-10-CM | POA: Diagnosis not present

## 2018-09-05 NOTE — Telephone Encounter (Signed)
Copied from CRM [id number]. Topic: Quick Communication - Rx Refill/Question >> Sep 05, 2018 12:51 PM Jens Som A wrote: Medication: chlorpheniramine-HYDROcodone Stevphen Meuse [facility] ER) 10-8 MG/5ML Kenton Kingfisher [[id number]]  DISCONTINUED   Has the patient contacted their pharmacy? Yes  (Agent: If no, request that the patient contact the pharmacy for the refill.) (Agent: If yes, when and what did the pharmacy advise?)  Preferred Pharmacy (with phone number or street name): [id number] [place], [place] - [address] AT [facility] [phone] (Phone) [phone] (Fax)    Agent: Please be advised that RX refills may take up to 3 business days. We ask that you follow-up with your pharmacy.

## 2018-09-06 NOTE — Telephone Encounter (Signed)
Called and left patient a VM letting her know what [name] said. Asked for patient to please call and schedule a OV if she is still having symptoms.

## 2018-09-06 NOTE — Telephone Encounter (Signed)
Will not refill, needs to be seen if still symptomatic

## 2018-09-06 NOTE — Telephone Encounter (Signed)
Medication: chlorpheniramine-HYDROcodone Stevphen Meuse [facility] ER) 10-8 MG/5ML SUER [[id number]]  DISCONTINUED     [id number] - [place], [place] - [address] AT [facility]         [phone] (Phone) [phone] (Fax)    No on current med profile

## 2018-09-07 ENCOUNTER — Other Ambulatory Visit: Payer: Self-pay

## 2018-09-07 ENCOUNTER — Ambulatory Visit
Admission: RE | Admit: 2018-09-07 | Discharge: 2018-09-07 | Disposition: A | Discharge status: Home / Self Care | Payer: Medicaid Other | Source: Ambulatory Visit | Attending: Family Medicine | Admitting: Family Medicine

## 2018-09-07 ENCOUNTER — Encounter: Payer: Self-pay | Admitting: Family Medicine

## 2018-09-07 ENCOUNTER — Ambulatory Visit (INDEPENDENT_AMBULATORY_CARE_PROVIDER_SITE_OTHER): Payer: Medicaid Other | Admitting: Family Medicine

## 2018-09-07 VITALS — BP 99/66 | HR 80 | Temp 98.0°F | Ht 64.0 in | Wt 173.0 lb

## 2018-09-07 DIAGNOSIS — J3089 Other allergic rhinitis: Secondary | ICD-10-CM | POA: Diagnosis not present

## 2018-09-07 DIAGNOSIS — R05 Cough: Secondary | ICD-10-CM | POA: Insufficient documentation

## 2018-09-07 DIAGNOSIS — R059 Cough, unspecified: Secondary | ICD-10-CM

## 2018-09-07 MED ORDER — BUDESONIDE-FORMOTEROL FUMARATE 160-4.5 MCG/ACT IN AERO: 2.0000 | INHALATION_SPRAY | Freq: Two times a day (BID) | RESPIRATORY_TRACT | 0 refills | Status: DC

## 2018-09-07 MED ORDER — ALBUTEROL SULFATE HFA 108 (90 BASE) MCG/ACT IN AERS: 2.0000 | INHALATION_SPRAY | Freq: Four times a day (QID) | RESPIRATORY_TRACT | 1 refills | Status: DC | PRN

## 2018-09-07 MED ORDER — FLUTICASONE PROPIONATE 50 MCG/ACT NA SUSP: 2.0000 | Freq: Two times a day (BID) | NASAL | 6 refills | Status: DC

## 2018-09-07 MED ORDER — CETIRIZINE HCL 10 MG PO TABS: 10.0000 mg | ORAL_TABLET | Freq: Every day | ORAL | 11 refills | Status: DC

## 2018-09-07 MED ORDER — TRIAMCINOLONE ACETONIDE 40 MG/ML IJ SUSP
40.0000 mg | Freq: Once | INTRAMUSCULAR | Status: AC
  Administered 2018-09-07: 40 mg via INTRAMUSCULAR

## 2018-09-07 NOTE — Progress Notes (Signed)
BP 99/66 (BP Location: Left Arm, Patient Position: Sitting, Cuff Size: Normal)   Pulse 80   Temp 98 F (36.7 C) (Oral)   Ht 5\' 4"  (1.626 m)   Wt 173 lb (78.5 kg)   SpO2 99%   BMI 29.70 kg/m    Subjective:    [name], female    DOB: Oct 25, 1990, 28 y.o.   MRN: [id number]  HPI: Alyssa Kemp is a 28 y.o. female  Chief Complaint  Patient presents with  . Cough    Ongoing appx 6 weeks. Cough won't go away. Patient states she's pulled a muscle in her chest from coughing.   Here today for ongoing hacking cough and fatigue for about 6 weeks now. Has had multiple respiratory illnesses over the winter and while the congestion and sore throat etc seem to dissipate the dry hacking cough has not let up. No fevers, SOB, wheezing. Feels like she's now pulled muscles in her chest from it. Tussionex seems to be the only thing that works. Has tried prednisone, albuterol inhaler, mucinex, OTC cough suppressants with minimal relief. No known hx of asthma or other chronic respiratory issues. Does have seasonal allergies not currently on medications.  Several sick contacts at home lately.   Relevant past medical, surgical, family and social history reviewed and updated as indicated. Interim medical history since our last visit reviewed. Allergies and medications reviewed and updated.  Review of Systems  Per HPI unless specifically indicated above     Objective:    BP 99/66 (BP Location: Left Arm, Patient Position: Sitting, Cuff Size: Normal)   Pulse 80   Temp 98 F (36.7 C) (Oral)   Ht 5\' 4"  (1.626 m)   Wt 173 lb (78.5 kg)   SpO2 99%   BMI 29.70 kg/m   Wt Readings from Last 3 Encounters:  09/07/18 173 lb (78.5 kg)  08/15/18 172 lb 8 oz (78.2 kg)  08/09/18 175 lb (79.4 kg)    Physical Exam Vitals signs and nursing note reviewed.  Constitutional:      Appearance: Normal appearance. She is not ill-appearing.  HENT:     Head: Atraumatic.     Nose: Rhinorrhea  present.     Mouth/Throat:     Pharynx: Posterior oropharyngeal erythema present.  Eyes:     Extraocular Movements: Extraocular movements intact.     Conjunctiva/sclera: Conjunctivae normal.  Neck:     Musculoskeletal: Normal range of motion and neck supple.  Cardiovascular:     Rate and Rhythm: Normal rate and regular rhythm.     Heart sounds: Normal heart sounds.  Pulmonary:     Effort: Pulmonary effort is normal.     Breath sounds: Normal breath sounds. No wheezing or rales.  Musculoskeletal: Normal range of motion.  Skin:    General: Skin is warm and dry.  Neurological:     Mental Status: She is alert and oriented to person, place, and time.  Psychiatric:        Mood and Affect: Mood normal.        Thought Content: Thought content normal.        Judgment: Judgment normal.     Results for orders placed or performed in visit on 08/15/18  Veritor Flu A/B Waived  Result Value Ref Range   Influenza A Negative Negative   Influenza B Negative Negative      Assessment & Plan:   Problem List Items Addressed This Visit  Respiratory   Allergic rhinitis    Start zyrtec and flonase regimen, monitor closely for benefit       Other Visit Diagnoses    Cough    -  Primary   Will add symbicort inhaler x [redacted] weeks along with prn albuterol and good allergy regimen and monitor closely for benefit. Recheck in 2 weeks   Relevant Medications   triamcinolone acetonide (KENALOG-40) injection 40 mg (Completed)   Other Relevant Orders   DG Chest 2 View (Completed)       Follow up plan: Return in about 2 weeks (around 09/21/2018) for cough recheck, possible spiro.

## 2018-09-08 DIAGNOSIS — J309 Allergic rhinitis, unspecified: Secondary | ICD-10-CM | POA: Insufficient documentation

## 2018-09-08 NOTE — Assessment & Plan Note (Signed)
Start zyrtec and flonase regimen, monitor closely for benefit

## 2018-10-07 DIAGNOSIS — F411 Generalized anxiety disorder: Secondary | ICD-10-CM | POA: Diagnosis not present

## 2018-10-21 ENCOUNTER — Telehealth: Payer: Self-pay | Admitting: Family Medicine

## 2018-10-21 DIAGNOSIS — F411 Generalized anxiety disorder: Secondary | ICD-10-CM | POA: Diagnosis not present

## 2018-10-21 MED ORDER — OMEPRAZOLE 40 MG PO CPDR: 40.0000 mg | DELAYED_RELEASE_CAPSULE | Freq: Every day | ORAL | 3 refills | Status: DC

## 2018-10-21 NOTE — Telephone Encounter (Signed)
Copied from CRM [id number]. Topic: Quick Communication - Rx Refill/Question >> Oct 21, 2018  3:08 PM Baldo Daub L wrote: Medication: omeprazole (PRILOSEC) 20 MG capsule  Pt calling to find out if this can be increased.  Pt states that she feels the medication just isn't working anymore. Pt can be reached at [phone]

## 2018-10-21 NOTE — Telephone Encounter (Signed)
Left message on machine for pt to return call to the office.  

## 2018-10-21 NOTE — Telephone Encounter (Signed)
Message relayed to patient. Verbalized understanding and denied questions.   

## 2018-10-21 NOTE — Telephone Encounter (Signed)
Please let her know that I sent over the 40 mg dose, she can take 2 of the 20 mg capsules until she runs out if she would like. Make sure she's taking it first thing in the morning on empty stomach for about 30-60 min for max benefit. She should f/u if still not getting relief.

## 2018-11-08 ENCOUNTER — Ambulatory Visit: Payer: Self-pay | Admitting: Family Medicine

## 2018-11-08 NOTE — Telephone Encounter (Signed)
Do y'all do the antibody test?  Back in January I was sick with all the symptoms of COVID-19  I got better.  Even my children got sick.   We are all fine now.  I let her know we were not doing antibody testing at this time to see if she has had the COVID-19.  She thanked me for my help.

## 2018-11-15 ENCOUNTER — Ambulatory Visit (INDEPENDENT_AMBULATORY_CARE_PROVIDER_SITE_OTHER): Payer: Medicaid Other | Admitting: Family Medicine

## 2018-11-15 ENCOUNTER — Other Ambulatory Visit: Payer: Self-pay

## 2018-11-15 ENCOUNTER — Encounter: Payer: Self-pay | Admitting: Family Medicine

## 2018-11-15 DIAGNOSIS — J069 Acute upper respiratory infection, unspecified: Secondary | ICD-10-CM | POA: Diagnosis not present

## 2018-11-15 DIAGNOSIS — Z9851 Tubal ligation status: Secondary | ICD-10-CM | POA: Diagnosis not present

## 2018-11-15 DIAGNOSIS — N979 Female infertility, unspecified: Secondary | ICD-10-CM | POA: Diagnosis not present

## 2018-11-15 NOTE — Progress Notes (Signed)
There were no vitals taken for this visit.   Subjective:    [name], female    DOB: Nov 28, 1990, 28 y.o.   MRN: [id number]  HPI: Alyssa Kemp is a 28 y.o. female  Chief Complaint  Patient presents with  . Referral    Endo.-  Wants to get tubes untied. 2018    . This visit was completed via telephone due to the restrictions of the COVID-19 pandemic. All issues as above were discussed and addressed but no physical exam was performed. If it was felt that the patient should be evaluated in the office, they were directed there. The patient verbally consented to this visit. Patient was unable to complete an audio/visual visit due to Technical difficulties,Lack of internet. Due to the catastrophic nature of the COVID-19 pandemic, this visit was done through audio contact only. . Location of the patient: home . Location of the provider: home . Those involved with this call:  . Provider: Roosvelt Maser, PA-C . CMA: Wilhemena Durie, CMA . Front Desk/Registration: Harriet Pho  . Time spent on call: 25 minutes on the phone discussing health concerns. 5 minutes total spent in review of patient's record and preparation of their chart. I verified patient identity using two factors (patient name and date of birth). Patient consents verbally to being seen via telemedicine visit today.   Patient presenting today to discuss a referral to get her tubal ligation reversed. She states she reached out to her GYN who performed the procedure in 2018 but was told they do not perform reversal procedures and that she would have to discuss this with a reproductive endocrinologist. She is very interested in having the procedure because she is with a new partner who wants to have children.   She would also like to have antibody testing for COVID 19 as she had a very bad respiratory infection several months ago with consistent sxs. These sxs have since resolved but she would like to know.    Relevant past medical, surgical, family and social history reviewed and updated as indicated. Interim medical history since our last visit reviewed. Allergies and medications reviewed and updated.  Review of Systems  Per HPI unless specifically indicated above     Objective:    There were no vitals taken for this visit.  Wt Readings from Last 3 Encounters:  09/07/18 173 lb (78.5 kg)  08/15/18 172 lb 8 oz (78.2 kg)  08/09/18 175 lb (79.4 kg)    Physical Exam  Unable to perform PE as patient did not have video technology for it  Results for orders placed or performed in visit on 08/15/18  Veritor Flu A/B Waived  Result Value Ref Range   Influenza A Negative Negative   Influenza B Negative Negative      Assessment & Plan:   Problem List Items Addressed This Visit    None    Visit Diagnoses    Female fertility problem    -  Primary   Referral placed to discuss reversal procedure or alternative options for fertility   Relevant Orders   Ambulatory referral to Endocrinology   Recent URI       Orders placed for antibody testing. Await results   Relevant Orders   SAR CoV2 Serology (COVID 19)AB(IGG)IA   History of bilateral tubal ligation       Relevant Orders   Ambulatory referral to Endocrinology       Follow up plan: Return if symptoms  worsen or fail to improve.

## 2018-11-18 ENCOUNTER — Other Ambulatory Visit: Payer: Self-pay

## 2018-11-18 ENCOUNTER — Other Ambulatory Visit: Payer: Medicaid Other

## 2018-11-18 DIAGNOSIS — J069 Acute upper respiratory infection, unspecified: Secondary | ICD-10-CM

## 2018-11-19 LAB — SAR COV2 SEROLOGY (COVID19)AB(IGG),IA: SARS-CoV-2 Ab, IgG: NEGATIVE

## 2018-11-23 ENCOUNTER — Telehealth: Payer: Self-pay | Admitting: Family Medicine

## 2018-11-23 NOTE — Telephone Encounter (Signed)
Looks like from what I can see, the referral is still active so can this just be re-sent now that she's had her insurance card updated?  Copied from CRM [id number]. Topic: General - Inquiry >> Nov 22, 2018  3:45 PM Alyssa Kemp wrote: Reason for CRM: Pt stated that [name] had a sent a referral for her to an endocrinologist but her Medicaid card had the wrong practice name on it so she was not able to schedule an appt. She would like to know if another referral can be sent to she can have an appt scheduled.

## 2018-12-01 DIAGNOSIS — F411 Generalized anxiety disorder: Secondary | ICD-10-CM | POA: Diagnosis not present

## 2018-12-07 DIAGNOSIS — F411 Generalized anxiety disorder: Secondary | ICD-10-CM | POA: Diagnosis not present

## 2018-12-16 DIAGNOSIS — F411 Generalized anxiety disorder: Secondary | ICD-10-CM | POA: Diagnosis not present

## 2018-12-22 DIAGNOSIS — F411 Generalized anxiety disorder: Secondary | ICD-10-CM | POA: Diagnosis not present

## 2018-12-30 DIAGNOSIS — F411 Generalized anxiety disorder: Secondary | ICD-10-CM | POA: Diagnosis not present

## 2019-02-16 DIAGNOSIS — F411 Generalized anxiety disorder: Secondary | ICD-10-CM | POA: Diagnosis not present

## 2019-02-20 ENCOUNTER — Other Ambulatory Visit: Payer: Self-pay | Admitting: Family Medicine

## 2019-02-20 NOTE — Telephone Encounter (Signed)
Routing to provider  

## 2019-02-20 NOTE — Telephone Encounter (Signed)
Requested medication (s) are due for refill today: yes  Requested medication (s) are on the active medication list:yes  Last refill:  01/22/19 Future visit scheduled: no  Notes to clinic: review for refill  Requested Prescriptions  Pending Prescriptions Disp Refills   omeprazole (PRILOSEC) 40 MG capsule [Pharmacy Med Name: OMEPRAZOLE 40MG  CAPSULES] 90 capsule     Sig: TAKE 1 CAPSULE(40 MG) BY MOUTH DAILY     Gastroenterology: Proton Pump Inhibitors Passed - 02/20/2019 10:03 AM      Passed - Valid encounter within last 12 months    Recent Outpatient Visits          3 months ago Female fertility problem   [place], [place]   5 months ago Cough   [place], [place], [place]   6 months ago Upper respiratory tract infection, unspecified type   [place], [place]   6 months ago Cough   [place], [place], [place]   7 months ago Annual physical exam   [facility] Merrie Roof [place], [place]

## 2019-03-14 ENCOUNTER — Ambulatory Visit (INDEPENDENT_AMBULATORY_CARE_PROVIDER_SITE_OTHER): Payer: Medicaid Other | Admitting: Family Medicine

## 2019-03-14 ENCOUNTER — Encounter: Payer: Self-pay | Admitting: Family Medicine

## 2019-03-14 ENCOUNTER — Other Ambulatory Visit: Payer: Self-pay

## 2019-03-14 VITALS — Ht 64.0 in | Wt 175.0 lb

## 2019-03-14 DIAGNOSIS — N898 Other specified noninflammatory disorders of vagina: Secondary | ICD-10-CM | POA: Diagnosis not present

## 2019-03-14 NOTE — Progress Notes (Signed)
Ht 5\' 4"  (1.626 m)   Wt 175 lb (79.4 kg)   BMI 30.04 kg/m    Subjective:    Patient ID: , female    DOB: 08/29/1990, 28 y.o.   MRN: [id number]  HPI: ALEXZA NORBECK is a 28 y.o. female  Chief Complaint  Patient presents with  . Vaginal Discharge    Symptoms ongoing 2 weeks. Patient states she thinks she had BV and a Yeast Infection.   . Vaginal Itching    . This visit was completed via WebEx due to the restrictions of the COVID-19 pandemic. All issues as above were discussed and addressed. Physical exam was done as above through visual confirmation on WebEx. If it was felt that the patient should be evaluated in the office, they were directed there. The patient verbally consented to this visit. . Location of the patient: home . Location of the provider: home . Those involved with this call:  . Provider: 30, PA-C . CMA: Roosvelt Maser, CMA . Front Desk/Registration: Myrtha Mantis  . Time spent on call: 15 minutes with patient face to face via video conference. More than 50% of this time was spent in counseling and coordination of care. 5 minutes total spent in review of patient's record and preparation of their chart. I verified patient identity using two factors (patient name and date of birth). Patient consents verbally to being seen via telemedicine visit today.   About 2 weeks of vaginal discharge and itching. Also having some discomfort with intercourse that started around the same time. Denies abnormal bleeding, dysuria, abdominal pain, N/V/D, fevers, concern for STIs. Not trying anything OTC for sxs.   Relevant past medical, surgical, family and social history reviewed and updated as indicated. Interim medical history since our last visit reviewed. Allergies and medications reviewed and updated.  Review of Systems  Per HPI unless specifically indicated above     Objective:    Ht 5\' 4"  (1.626 m)   Wt 175 lb (79.4 kg)   BMI 30.04 kg/m   Wt  Readings from Last 3 Encounters:  03/14/19 175 lb (79.4 kg)  09/07/18 173 lb (78.5 kg)  08/15/18 172 lb 8 oz (78.2 kg)    Physical Exam Vitals signs and nursing note reviewed.  Constitutional:      General: She is not in acute distress.    Appearance: Normal appearance.  HENT:     Head: Atraumatic.     Right Ear: External ear normal.     Left Ear: External ear normal.     Nose: Nose normal. No congestion.     Mouth/Throat:     Mouth: Mucous membranes are moist.     Pharynx: Oropharynx is clear. No posterior oropharyngeal erythema.  Eyes:     Extraocular Movements: Extraocular movements intact.     Conjunctiva/sclera: Conjunctivae normal.  Neck:     Musculoskeletal: Normal range of motion.  Cardiovascular:     Comments: Unable to assess via virtual visit Pulmonary:     Effort: Pulmonary effort is normal. No respiratory distress.  Abdominal:     Comments: No abdominal ttp per patient's self exam  Musculoskeletal: Normal range of motion.  Skin:    General: Skin is dry.     Findings: No erythema.  Neurological:     Mental Status: She is alert and oriented to person, place, and time.  Psychiatric:        Mood and Affect: Mood normal.  Thought Content: Thought content normal.        Judgment: Judgment normal.     Results for orders placed or performed in visit on 11/18/18  SAR CoV2 Serology (COVID 19)AB(IGG)IA  Result Value Ref Range   [place] SARS-CoV-2 Ab, IgG Negative Negative      Assessment & Plan:   Problem List Items Addressed This Visit    None    Visit Diagnoses    Vaginal discharge    -  Primary   Patient to come for lab appt for U/A and wet prep, will treat based on results. Probiotics, good vaginal hygiene in meantime   Relevant Orders   UA/M w/rflx Culture, Routine   WET PREP FOR TRICH, YEAST, CLUE       Follow up plan: Return if symptoms worsen or fail to improve.

## 2019-03-17 ENCOUNTER — Other Ambulatory Visit: Payer: Medicaid Other

## 2019-03-17 ENCOUNTER — Other Ambulatory Visit: Payer: Self-pay

## 2019-03-17 DIAGNOSIS — N898 Other specified noninflammatory disorders of vagina: Secondary | ICD-10-CM | POA: Diagnosis not present

## 2019-03-17 LAB — WET PREP FOR TRICH, YEAST, CLUE
Clue Cell Exam: POSITIVE — AB
Trichomonas Exam: NEGATIVE
Yeast Exam: NEGATIVE

## 2019-03-17 LAB — UA/M W/RFLX CULTURE, ROUTINE
Bilirubin, UA: NEGATIVE
Glucose, UA: NEGATIVE
Ketones, UA: NEGATIVE
Leukocytes,UA: NEGATIVE
Nitrite, UA: NEGATIVE
Protein,UA: NEGATIVE
Specific Gravity, UA: 1.01 (ref 1.005–1.030)
Urobilinogen, Ur: 0.2 mg/dL (ref 0.2–1.0)
pH, UA: 6 (ref 5.0–7.5)

## 2019-03-17 LAB — MICROSCOPIC EXAMINATION

## 2019-03-20 ENCOUNTER — Telehealth: Payer: Self-pay | Admitting: Family Medicine

## 2019-03-20 MED ORDER — METRONIDAZOLE 500 MG PO TABS: 500.0000 mg | ORAL_TABLET | Freq: Two times a day (BID) | ORAL | 0 refills | Status: DC

## 2019-03-20 NOTE — Telephone Encounter (Signed)
Please let her know she was positive for BV so I have sent over some antibiotics to clear that up  Copied from [place] [id number]. Topic: General - Inquiry >> Mar 17, 2019  3:35 PM Selinda Flavin B, [place] wrote: Reason for CRM: Patient calling to check on her lab results from today. Please advise.

## 2019-03-20 NOTE — Telephone Encounter (Signed)
Patient notified

## 2019-04-05 DIAGNOSIS — F411 Generalized anxiety disorder: Secondary | ICD-10-CM | POA: Diagnosis not present

## 2019-04-20 DIAGNOSIS — F411 Generalized anxiety disorder: Secondary | ICD-10-CM | POA: Diagnosis not present

## 2019-05-05 ENCOUNTER — Other Ambulatory Visit: Payer: Self-pay

## 2019-05-05 ENCOUNTER — Emergency Department
Admission: EM | Admit: 2019-05-05 | Discharge: 2019-05-05 | Disposition: A | Discharge status: Home / Self Care | Payer: Medicaid Other | Attending: Emergency Medicine | Admitting: Emergency Medicine

## 2019-05-05 DIAGNOSIS — T23271A Burn of second degree of right wrist, initial encounter: Secondary | ICD-10-CM | POA: Diagnosis not present

## 2019-05-05 DIAGNOSIS — Z79899 Other long term (current) drug therapy: Secondary | ICD-10-CM | POA: Insufficient documentation

## 2019-05-05 DIAGNOSIS — Y999 Unspecified external cause status: Secondary | ICD-10-CM | POA: Diagnosis not present

## 2019-05-05 DIAGNOSIS — T310 Burns involving less than 10% of body surface: Secondary | ICD-10-CM | POA: Diagnosis not present

## 2019-05-05 DIAGNOSIS — X102XXA Contact with fats and cooking oils, initial encounter: Secondary | ICD-10-CM | POA: Diagnosis not present

## 2019-05-05 DIAGNOSIS — T300 Burn of unspecified body region, unspecified degree: Secondary | ICD-10-CM

## 2019-05-05 DIAGNOSIS — T23261A Burn of second degree of back of right hand, initial encounter: Secondary | ICD-10-CM | POA: Diagnosis not present

## 2019-05-05 DIAGNOSIS — Y93G3 Activity, cooking and baking: Secondary | ICD-10-CM | POA: Diagnosis not present

## 2019-05-05 DIAGNOSIS — Y929 Unspecified place or not applicable: Secondary | ICD-10-CM | POA: Diagnosis not present

## 2019-05-05 DIAGNOSIS — T23061A Burn of unspecified degree of back of right hand, initial encounter: Secondary | ICD-10-CM | POA: Diagnosis present

## 2019-05-05 MED ORDER — HYDROCODONE-ACETAMINOPHEN 5-325 MG PO TABS
2.0000 | ORAL_TABLET | Freq: Once | ORAL | Status: AC
  Administered 2019-05-05: 2 via ORAL
  Filled 2019-05-05: qty 2

## 2019-05-05 MED ORDER — HYDROCODONE-ACETAMINOPHEN 5-325 MG PO TABS: 1.0000 | ORAL_TABLET | Freq: Four times a day (QID) | ORAL | 0 refills | Status: DC | PRN

## 2019-05-05 MED ORDER — IBUPROFEN 600 MG PO TABS
600.0000 mg | ORAL_TABLET | ORAL | Status: DC
  Filled 2019-05-05: qty 1

## 2019-05-05 MED ORDER — IBUPROFEN 600 MG PO TABS
600.0000 mg | ORAL_TABLET | Freq: Once | ORAL | Status: AC
  Administered 2019-05-05: 600 mg via ORAL
  Filled 2019-05-05: qty 1

## 2019-05-05 NOTE — ED Notes (Signed)
Cleaned patient's hand with soapy solution.  Will wrap with nonstick dressing and light guaze wrap per Dr. Jacqualine Code.

## 2019-05-05 NOTE — ED Notes (Addendum)
This RN introduced self to pt. Pt states she was frying some chicken and when she was getting rid of the grease it splashed onto her right hand. Pt in NAD and VSS. Pt states she has had a previous burn to the same area. Will continue to monitor.

## 2019-05-05 NOTE — Discharge Instructions (Signed)
No driving today or while taking hydrocodone. 

## 2019-05-05 NOTE — ED Provider Notes (Signed)
[facility] Emergency Department Provider Note   ____________________________________________   First MD Initiated Contact with Patient 05/05/19 1956     (approximate)  I have reviewed the triage vital signs and the nursing notes.   HISTORY  Chief Complaint Hand Burn    HPI Alyssa Kemp is a 28 y.o. female for evaluation of a hand burn that occurred about 38  Patient reports she was cooking chicken with a oil fryer, she spilled some of the oil onto her right hand only no other injury.  She noticed burning in the skin and it is painful and stinging.  She still able to move fingers no numbness or tingling except the area is painful.  She did not actually burned the tips of the fingers themselves but just below the thumb and across the wrists.  No injury anywhere else just the right hand.  She is right-hand dominant.  Reports she had a basically the same injury 10 years ago and had to have brief rehab for that  She has not been recently ill   Past Medical History:  Diagnosis Date  . Bipolar depression ([place])   . GERD (gastroesophageal reflux disease)   . Gestational diabetes   . Gestational diabetes mellitus (GDM) affecting pregnancy, antepartum 09/05/2016  . History of macrosomia in infant in prior pregnancy, currently pregnant   . 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 Scoliosis     Patient Active Problem List   Diagnosis Date Noted  . Allergic rhinitis 09/08/2018  . Obesity (BMI 30.0-34.9) 03/31/2018  . Chlamydia 02/15/2017  . Encounter for sterilization 09/19/2016  . Normal labor and delivery 09/18/2016  . Labor and delivery indication for care or intervention 09/14/2016  . High risk pregnancy, antepartum 09/05/2016  . Bipolar 1 disorder ([place]) 09/05/2016  . Rh negative state in antepartum period 09/05/2016  . GERD (gastroesophageal reflux disease) 08/28/2016  . 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 Anxiety, generalized  11/05/2015  . 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/05/2015  . History of chlamydia infection 11/05/2015  . History of abnormal Pap smear 02/21/2013    Past Surgical History:  Procedure Laterality Date  . ADENOIDECTOMY    . 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 CYSTOSCOPY W/ RETROGRADES Bilateral 12/28/2017   Procedure: CYSTOSCOPY WITH RETROGRADE PYELOGRAM;  Surgeon: Abbie Sons, MD;  Location: [facility] ORS;  Service: Urology;  Laterality: Bilateral;  . TONSILLECTOMY    . TUBAL LIGATION Bilateral 09/19/2016   Procedure: POST PARTUM TUBAL LIGATION;  Surgeon: Will Bonnet, MD;  Location: [facility] ORS;  Service: Gynecology;  Laterality: Bilateral;  . WISDOM TOOTH EXTRACTION      Prior to Admission medications   Medication Sig Start Date End Date Taking? Authorizing Provider  busPIRone (BUSPAR) 10 MG tablet Take 1 tablet by mouth 2 (two) times daily. 02/16/19   [provider]  cetirizine (ZYRTEC) 10 MG tablet Take 1 tablet (10 mg total) by mouth daily. 09/07/18   Volney American, PA-C  HYDROcodone-acetaminophen (NORCO/VICODIN) 5-325 MG tablet Take 1-2 tablets by mouth every 6 (six) hours as needed for moderate pain. 05/05/19   Delman Kitten, MD  metroNIDAZOLE (FLAGYL) 500 MG tablet Take 1 tablet (500 mg total) by mouth 2 (two) times daily. 03/20/19   Volney American, PA-C  omeprazole (PRILOSEC) 40 MG capsule TAKE 1 CAPSULE(40 MG) BY MOUTH DAILY 02/20/19   Volney American, PA-C    Allergies Sulfacetamide sodium,  Sulfasalazine, Doxycycline, Cantaloupe (diagnostic), and Amoxicillin-pot clavulanate  Family History  Problem Relation Age of Onset  . Cervical cancer Mother   . Ovarian cancer Mother   . Hypertension Maternal Grandmother   . Pancreatic cancer Maternal Grandmother   . Diabetes Maternal Grandmother   . Coronary artery disease Maternal Grandfather   . Congestive Heart Failure Maternal Grandfather   . Hypertension Maternal Grandfather   .  Prostate cancer Maternal Grandfather   . Diabetes Maternal Grandfather   . Hypertension Paternal Grandmother   . Diabetes Paternal Grandmother   . Heart disease Paternal Grandfather   . Hypertension Paternal Grandfather   . Breast cancer Paternal Aunt     Social History Social History   Tobacco Use  . Smoking status: Never Smoker  . Smokeless tobacco: Never Used  Substance Use Topics  . Alcohol use: No  . Drug use: No    Review of Systems Constitutional: No fever/chills Cardiovascular: Denies chest pain. Respiratory: Denies shortness of breath. Musculoskeletal: See HPI skin: See HPI neurological: Negative for  areas of focal weakness or numbness.    ____________________________________________   PHYSICAL EXAM:  VITAL SIGNS: ED Triage Vitals  Enc Vitals Group     BP 05/05/19 1945 108/89     Pulse Rate 05/05/19 1945 84     Resp 05/05/19 1945 14     Temp 05/05/19 1945 99.1 F (37.3 C)     Temp Source 05/05/19 1945 Oral     SpO2 05/05/19 1945 99 %     Weight --      Height --      Head Circumference --      Peak Flow --      Pain Score 05/05/19 1946 8     Pain Loc --      Pain Edu? --      Excl. in GC? --     Constitutional: Alert and oriented. Well appearing and in no acute distress.  Does appear in some pain now, right hand quivering slightly due to pain. Eyes: Conjunctivae are normal. Head: Atraumatic. Nose: No congestion/rhinnorhea. Mouth/Throat: Mucous membranes are moist. Neck: No stridor.  Cardiovascular: Normal rate, regular rhythm. Grossly normal heart sounds.  Strong right radial pulse with normal capillary refill all digits of the right hand. Respiratory: Normal respiratory effort.  No retractions. Musculoskeletal:   RIGHT Right upper extremity demonstrates normal strength, good use of all muscles some limitation of flexion extension of the wrist. Strong radial pulse. Intact median/ulnar/radial neuro-muscular exam.  Patient has second-degree burn  overlying the right hand primarily over the volar and dorsal regions of the thenar eminence without involvement of the interphalangeal joint or MTP joint.  She does have some mild erythema and slight second-degree burn also present over the skin folds of the wrist on both sides.  There is no notable edema.  There has been some burn injury to the skin in this area and there is no deep or third-degree burn areas.  LEFT Left upper extremity demonstrates normal strength, good use of all muscles. No edema bruising or contusions of the left shoulder/upper arm, left elbow, left forearm / hand. Full range of motion of the left  upper extremity without pain. No evidence of trauma. Strong radial pulse. Intact median/ulnar/radial neuro-muscular exam.   Neurologic:  Normal speech and language. No gross focal neurologic deficits are appreciated.  Skin:  Skin is warm, dry and intact. No rash noted. Psychiatric: Mood and affect are normal. Speech and behavior are normal.  ____________________________________________   LABS (all labs ordered are listed, but only abnormal results are displayed)  Labs Reviewed - No data to display ____________________________________________  EKG   ____________________________________________  RADIOLOGY   ____________________________________________   PROCEDURES  Procedure(s) performed: None  Procedures  Critical Care performed: No  ____________________________________________   INITIAL IMPRESSION / ASSESSMENT AND PLAN / ED COURSE  Pertinent labs & imaging results that were available during my care of the patient were reviewed by me and considered in my medical decision making (see chart for details).   Patient reports up-to-date tetanus.  Has previously used hydrocodone with good effect for pain control and uses ibuprofen at home from time to time as well for pain control.  Will trial both, have [facility] and [facility] Aleen SellsWilson,  [place] Who recommends Vaseline gauze dressing and [facility].  Discussed with the patient he is agreement with plan.  I will prescribe the patient a narcotic pain medicine due to their condition which I anticipate will cause at least moderate pain short term. I discussed with the patient safe use of narcotic pain medicines, and that they are not to drive, work in dangerous areas, or ever take more than prescribed (no more than 1 pill every 6 hours). We discussed that this is the type of medication that can be  overdosed on and the risks of this type of medicine. Patient is very agreeable to only use as prescribed and to never use more than prescribed.  Return precautions and treatment recommendations and follow-up discussed with the patient who is agreeable with the plan.       ____________________________________________   FINAL CLINICAL IMPRESSION(S) / ED DIAGNOSES  Final diagnoses:  Burn  Second-degree burn, right wrist and hand      Note:  This document was prepared using Dragon voice recognition software and may include unintentional dictation errors       Sharyn CreamerQuale, , MD 05/05/19 2114
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
## 2019-05-08 DIAGNOSIS — T23271A Burn of second degree of right wrist, initial encounter: Secondary | ICD-10-CM | POA: Diagnosis not present

## 2019-05-08 DIAGNOSIS — T23261A Burn of second degree of back of right hand, initial encounter: Secondary | ICD-10-CM | POA: Diagnosis not present

## 2019-05-09 DIAGNOSIS — F411 Generalized anxiety disorder: Secondary | ICD-10-CM | POA: Diagnosis not present

## 2019-05-17 DIAGNOSIS — T23201A Burn of second degree of right hand, unspecified site, initial encounter: Secondary | ICD-10-CM | POA: Insufficient documentation

## 2019-05-17 DIAGNOSIS — T23271A Burn of second degree of right wrist, initial encounter: Secondary | ICD-10-CM | POA: Insufficient documentation

## 2019-05-22 ENCOUNTER — Other Ambulatory Visit: Payer: Self-pay

## 2019-05-22 MED ORDER — OMEPRAZOLE 40 MG PO CPDR: DELAYED_RELEASE_CAPSULE | ORAL | 0 refills | Status: DC

## 2019-05-23 DIAGNOSIS — F411 Generalized anxiety disorder: Secondary | ICD-10-CM | POA: Diagnosis not present

## 2019-06-06 DIAGNOSIS — F411 Generalized anxiety disorder: Secondary | ICD-10-CM | POA: Diagnosis not present

## 2019-06-30 ENCOUNTER — Ambulatory Visit (INDEPENDENT_AMBULATORY_CARE_PROVIDER_SITE_OTHER): Payer: Medicaid Other | Admitting: Family Medicine

## 2019-06-30 ENCOUNTER — Other Ambulatory Visit: Payer: Self-pay

## 2019-06-30 ENCOUNTER — Encounter: Payer: Self-pay | Admitting: Family Medicine

## 2019-06-30 VITALS — Ht 64.0 in | Wt 167.0 lb

## 2019-06-30 DIAGNOSIS — R197 Diarrhea, unspecified: Secondary | ICD-10-CM

## 2019-06-30 DIAGNOSIS — R109 Unspecified abdominal pain: Secondary | ICD-10-CM

## 2019-06-30 NOTE — Progress Notes (Signed)
Ht 5\' 4"  (1.626 m)   Wt 167 lb (75.8 kg)   BMI 28.67 kg/m    Subjective:    Patient ID: , female    DOB: September 20, 1990, 29 y.o.   MRN: [id number]  HPI: Alyssa Kemp is a 29 y.o. female  Chief Complaint  Patient presents with  . Gastroesophageal Reflux    patient atest that she has had diarrhea on and off for a few months and she is not sure if the omeprazole is causing this    . This visit was completed via WebEx due to the restrictions of the COVID-19 pandemic. All issues as above were discussed and addressed. Physical exam was done as above through visual confirmation on WebEx. If it was felt that the patient should be evaluated in the office, they were directed there. The patient verbally consented to this visit. . Location of the patient: home . Location of the provider: home . Those involved with this call:  . Provider: 34, PA-C . CMA: Roosvelt Maser, CMA . Front Desk/Registration: Elton Sin  . Time spent on call: 15 minutes with patient face to face via video conference. More than 50% of this time was spent in counseling and coordination of care. 5 minutes total spent in review of patient's record and preparation of their chart. I verified patient identity using two factors (patient name and date of birth). Patient consents verbally to being seen via telemedicine visit today.   Patient presenting today with concerns about some GI issues she's been having. Has diarrhea and frequent BMs, worst the past few months. Also notes now having trouble with dairy products - seeming to have diarrhea and cramping with consumption. Daughter is lactose intolerant per patient. Taking prilosec daily for GERD and read that diarrhea can be a side effect with that as well. Has tried coming off the prilosec for a week without much benefit but did have worsening reflux during that time. Denies fever, chills, N/V, weight loss.   Relevant past medical, surgical, family and  social history reviewed and updated as indicated. Interim medical history since our last visit reviewed. Allergies and medications reviewed and updated.  Review of Systems  Per HPI unless specifically indicated above     Objective:    Ht 5\' 4"  (1.626 m)   Wt 167 lb (75.8 kg)   BMI 28.67 kg/m   Wt Readings from Last 3 Encounters:  06/30/19 167 lb (75.8 kg)  03/14/19 175 lb (79.4 kg)  09/07/18 173 lb (78.5 kg)    Physical Exam Vitals and nursing note reviewed.  Constitutional:      General: She is not in acute distress.    Appearance: Normal appearance.  HENT:     Head: Atraumatic.     Right Ear: External ear normal.     Left Ear: External ear normal.     Nose: Nose normal. No congestion.     Mouth/Throat:     Mouth: Mucous membranes are moist.     Pharynx: Oropharynx is clear. No posterior oropharyngeal erythema.  Eyes:     Extraocular Movements: Extraocular movements intact.     Conjunctiva/sclera: Conjunctivae normal.  Cardiovascular:     Comments: Unable to assess via virtual visit Pulmonary:     Effort: Pulmonary effort is normal. No respiratory distress.  Abdominal:     Comments: Unable to perform abdominal exam due to virtual nature of appt  Musculoskeletal:        General: Normal  range of motion.     Cervical back: Normal range of motion.  Skin:    General: Skin is dry.     Findings: No erythema.  Neurological:     Mental Status: She is alert and oriented to person, place, and time.  Psychiatric:        Mood and Affect: Mood normal.        Thought Content: Thought content normal.        Judgment: Judgment normal.     Results for orders placed or performed in visit on 03/17/19  Shirley, YEAST, CLUE   Specimen: Vaginal Fluid   VAGINAL FLUI  Result Value Ref Range   Trichomonas Exam Negative Negative   Yeast Exam Negative Negative   Clue Cell Exam Positive (A) Negative  Microscopic Examination   VAGINAL FLUI  Result Value Ref Range    WBC, UA 0-5 0 - 5 /hpf   RBC 0-2 0 - 2 /hpf   Epithelial Cells (non renal) 0-10 0 - 10 /hpf   Bacteria, UA Few (A) None seen/Few  UA/M w/rflx Culture, Routine   Specimen: Vaginal Fluid   VAGINAL FLUI  Result Value Ref Range   Specific Gravity, UA 1.010 1.005 - 1.030   pH, UA 6.0 5.0 - 7.5   Color, UA Yellow Yellow   Appearance Ur Clear Clear   Leukocytes,UA Negative Negative   Protein,UA Negative Negative/Trace   Glucose, UA Negative Negative   Ketones, UA Negative Negative   RBC, UA 1+ (A) Negative   Bilirubin, UA Negative Negative   Urobilinogen, Ur 0.2 0.2 - 1.0 mg/dL   Nitrite, UA Negative Negative   Microscopic Examination See below:       Assessment & Plan:   Problem List Items Addressed This Visit    None    Visit Diagnoses    Diarrhea, unspecified type    -  Primary   Abdominal cramping          Possibly lactose intolerance, can try avoiding dairy products and use lactaid if needing to eat something that is dairy. Can also try pepcid instead of prilosec for a few weeks to see if this helps. F/u if sxs not improving for further testing and in person exam  Follow up plan: Return for as scheduled.

## 2019-07-04 DIAGNOSIS — F411 Generalized anxiety disorder: Secondary | ICD-10-CM | POA: Diagnosis not present

## 2019-07-12 DIAGNOSIS — F411 Generalized anxiety disorder: Secondary | ICD-10-CM | POA: Diagnosis not present

## 2019-07-19 DIAGNOSIS — F411 Generalized anxiety disorder: Secondary | ICD-10-CM | POA: Diagnosis not present

## 2019-07-27 DIAGNOSIS — F411 Generalized anxiety disorder: Secondary | ICD-10-CM | POA: Diagnosis not present

## 2019-07-31 ENCOUNTER — Ambulatory Visit (INDEPENDENT_AMBULATORY_CARE_PROVIDER_SITE_OTHER): Payer: Medicaid Other | Admitting: Family Medicine

## 2019-07-31 ENCOUNTER — Encounter: Payer: Self-pay | Admitting: Family Medicine

## 2019-07-31 ENCOUNTER — Other Ambulatory Visit: Payer: Self-pay

## 2019-07-31 ENCOUNTER — Other Ambulatory Visit (HOSPITAL_COMMUNITY)
Admission: RE | Admit: 2019-07-31 | Discharge: 2019-07-31 | Disposition: A | Discharge status: Home / Self Care | Payer: Medicaid Other | Source: Ambulatory Visit | Attending: Family Medicine | Admitting: Family Medicine

## 2019-07-31 VITALS — BP 108/72 | HR 69 | Temp 98.2°F | Ht 63.5 in | Wt 161.0 lb

## 2019-07-31 DIAGNOSIS — H539 Unspecified visual disturbance: Secondary | ICD-10-CM | POA: Diagnosis not present

## 2019-07-31 DIAGNOSIS — N926 Irregular menstruation, unspecified: Secondary | ICD-10-CM

## 2019-07-31 DIAGNOSIS — Z0000 Encounter for general adult medical examination without abnormal findings: Secondary | ICD-10-CM | POA: Diagnosis not present

## 2019-07-31 DIAGNOSIS — Z124 Encounter for screening for malignant neoplasm of cervix: Secondary | ICD-10-CM | POA: Diagnosis not present

## 2019-07-31 DIAGNOSIS — J069 Acute upper respiratory infection, unspecified: Secondary | ICD-10-CM

## 2019-07-31 DIAGNOSIS — Z8619 Personal history of other infectious and parasitic diseases: Secondary | ICD-10-CM | POA: Insufficient documentation

## 2019-07-31 DIAGNOSIS — R197 Diarrhea, unspecified: Secondary | ICD-10-CM | POA: Diagnosis not present

## 2019-07-31 DIAGNOSIS — Z113 Encounter for screening for infections with a predominantly sexual mode of transmission: Secondary | ICD-10-CM

## 2019-07-31 LAB — MICROSCOPIC EXAMINATION
Bacteria, UA: NONE SEEN
WBC, UA: NONE SEEN /hpf (ref 0–5)

## 2019-07-31 LAB — UA/M W/RFLX CULTURE, ROUTINE
Bilirubin, UA: NEGATIVE
Glucose, UA: NEGATIVE
Ketones, UA: NEGATIVE
Leukocytes,UA: NEGATIVE
Nitrite, UA: NEGATIVE
Protein,UA: NEGATIVE
Specific Gravity, UA: 1.02 (ref 1.005–1.030)
Urobilinogen, Ur: 0.2 mg/dL (ref 0.2–1.0)
pH, UA: 7 (ref 5.0–7.5)

## 2019-07-31 NOTE — Progress Notes (Signed)
BP 108/72   Pulse 69   Temp 98.2 F (36.8 C) (Oral)   Ht 5' 3.5" (1.613 m)   Wt 161 lb (73 kg)   SpO2 98%   BMI 28.07 kg/m    Subjective:    [name], female    DOB: [birth year], 29 y.o.   MRN: [id number]  HPI: Alyssa Kemp is a 29 y.o. female presenting on 07/31/2019 for comprehensive medical examination. Current medical complaints include:see below  Has stopped milk and other dairy products and switched to prilosec and notes sxs significantly improved in her abdominal cramping, bloating, diarrhea issues.   Periods have changed in the last year, lasting only about 48 hours and very light. Last period was last week. Hx of tubal ligation. Oral contraceptives were poorly tolerated in the past. Concerned that this may be a sign that her fertility is compromised.   Issues with night driving, the glare of the lights is very bothersome for her. Has not had an eye exam or workup since school age. Does not wear corrective devices.   Had a sore throat and congestion back in November, wanting COVID antibody testing.   She currently lives with: Menopausal Symptoms: no  Depression Screen done today and results listed below:  Depression screen [facility] 2/9 07/31/2019 07/11/2018 03/31/2018 07/31/2016  Decreased Interest 0 0 0 0  Down, Depressed, Hopeless 0 0 0 0  PHQ - 2 Score 0 0 0 0  Altered sleeping 0 0 0 -  Tired, decreased energy 0 0 0 -  Change in appetite 0 0 3 -  Feeling bad or failure about yourself  0 0 1 -  Trouble concentrating 0 3 0 -  Moving slowly or fidgety/restless 0 0 0 -  Suicidal thoughts 0 - 0 -  PHQ-9 Score 0 3 4 -  Difficult doing work/chores - Not difficult at all - -    The patient does not have a history of falls. I did complete a risk assessment for falls. A plan of care for falls was documented.   Past Medical History:  Past Medical History:  Diagnosis Date  . Bipolar depression (HCC)   . GERD (gastroesophageal reflux disease)   .  Gestational diabetes   . Gestational diabetes mellitus (GDM) affecting pregnancy, antepartum 09/05/2016  . History of macrosomia in infant in prior pregnancy, currently pregnant   . 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 Scoliosis     Surgical History:  Past Surgical History:  Procedure Laterality Date  . ADENOIDECTOMY    . 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 CYSTOSCOPY W/ RETROGRADES Bilateral 12/28/2017   Procedure: CYSTOSCOPY WITH RETROGRADE PYELOGRAM;  Surgeon: [name], MD;  Location: [facility] ORS;  Service: Urology;  Laterality: Bilateral;  . TONSILLECTOMY    . TUBAL LIGATION Bilateral 09/19/2016   Procedure: POST PARTUM TUBAL LIGATION;  Surgeon: [name], MD;  Location: [facility] ORS;  Service: Gynecology;  Laterality: Bilateral;  . WISDOM TOOTH EXTRACTION      Medications:  Current Outpatient Medications on File Prior to Visit  Medication Sig  . cetirizine (ZYRTEC) 10 MG tablet Take 1 tablet (10 mg total) by mouth daily.  . Multiple Vitamin (MULTIVITAMINS PO) Take by mouth daily.   No current facility-administered medications on file prior to visit.    Allergies:  Allergies  Allergen Reactions  . Sulfacetamide Sodium Shortness Of Breath  . Sulfasalazine Shortness Of Breath  . Doxycycline Swelling  .  Cantaloupe (Diagnostic) Swelling  . Amoxicillin-Pot Clavulanate Other (See Comments)    Unknown- from childhood    Social History:  Social History   Socioeconomic History  . Marital status: Single    Spouse name: Not on file  . Number of children: Not on file  . Years of education: Not on file  . Highest education level: Not on file  Occupational History  . Not on file  Tobacco Use  . Smoking status: Never Smoker  . Smokeless tobacco: Never Used  Substance and Sexual Activity  . Alcohol use: No  . Drug use: No  . Sexual activity: Yes    Birth control/protection: None, Surgical  Other Topics Concern  . Not on file  Social History Narrative  . Not  on file   Social Determinants of Health   Financial Resource Strain:   . Difficulty of Paying Living Expenses: Not on file  Food Insecurity:   . Worried About Programme researcher, broadcasting/film/video in the Last Year: Not on file  . Ran Out of Food in the Last Year: Not on file  Transportation Needs:   . Lack of Transportation (Medical): Not on file  . Lack of Transportation (Non-Medical): Not on file  Physical Activity:   . Days of Exercise per Week: Not on file  . Minutes of Exercise per Session: Not on file  Stress:   . Feeling of Stress : Not on file  Social Connections:   . Frequency of Communication with Friends and Family: Not on file  . Frequency of Social Gatherings with Friends and Family: Not on file  . Attends Religious Services: Not on file  . Active Member of Clubs or Organizations: Not on file  . Attends Banker Meetings: Not on file  . Marital Status: Not on file  Intimate Partner Violence:   . Fear of Current or Ex-Partner: Not on file  . Emotionally Abused: Not on file  . Physically Abused: Not on file  . Sexually Abused: Not on file   Social History   Tobacco Use  Smoking Status Never Smoker  Smokeless Tobacco Never Used   Social History   Substance and Sexual Activity  Alcohol Use No    Family History:  Family History  Problem Relation Age of Onset  . Cervical cancer Mother   . Ovarian cancer Mother   . Hypertension Maternal Grandmother   . Pancreatic cancer Maternal Grandmother   . Diabetes Maternal Grandmother   . Coronary artery disease Maternal Grandfather   . Congestive Heart Failure Maternal Grandfather   . Hypertension Maternal Grandfather   . Prostate cancer Maternal Grandfather   . Diabetes Maternal Grandfather   . Hypertension Paternal Grandmother   . Diabetes Paternal Grandmother   . Heart disease Paternal Grandfather   . Hypertension Paternal Grandfather   . Breast cancer Paternal Aunt     Past medical history, surgical history,  medications, allergies, family history and social history reviewed with patient today and changes made to appropriate areas of the chart.   Review of Systems - General ROS: negative Psychological ROS: negative Ophthalmic ROS: negative ENT ROS: negative Allergy and Immunology ROS: negative Hematological and Lymphatic ROS: negative Endocrine ROS: negative Breast ROS: negative for breast lumps Respiratory ROS: no cough, shortness of breath, or wheezing Cardiovascular ROS: no chest pain or dyspnea on exertion Gastrointestinal ROS: no abdominal pain, change in bowel habits, or black or bloody stools Genito-Urinary ROS: no dysuria, trouble voiding, or hematuria Musculoskeletal ROS: negative  Neurological ROS: no TIA or stroke symptoms Dermatological ROS: negative All other ROS negative except what is listed above and in the HPI.      Objective:    BP 108/72   Pulse 69   Temp 98.2 F (36.8 C) (Oral)   Ht 5' 3.5" (1.613 m)   Wt 161 lb (73 kg)   SpO2 98%   BMI 28.07 kg/m   Wt Readings from Last 3 Encounters:  07/31/19 161 lb (73 kg)  06/30/19 167 lb (75.8 kg)  03/14/19 175 lb (79.4 kg)    Physical Exam Vitals and nursing note reviewed.  Constitutional:      General: She is not in acute distress.    Appearance: She is well-developed.  HENT:     Head: Atraumatic.     Right Ear: External ear normal.     Left Ear: External ear normal.     Nose: Nose normal.     Mouth/Throat:     Pharynx: No oropharyngeal exudate.  Eyes:     General: No scleral icterus.    Conjunctiva/sclera: Conjunctivae normal.     Pupils: Pupils are equal, round, and reactive to light.  Neck:     Thyroid: No thyromegaly.  Cardiovascular:     Rate and Rhythm: Normal rate and regular rhythm.     Heart sounds: Normal heart sounds.  Pulmonary:     Effort: Pulmonary effort is normal. No respiratory distress.     Breath sounds: Normal breath sounds.  Chest:     Breasts:        Right: No mass, skin change  or tenderness.        Left: No mass, skin change or tenderness.  Abdominal:     General: Bowel sounds are normal.     Palpations: Abdomen is soft. There is no mass.     Tenderness: There is no abdominal tenderness.  Genitourinary:    General: Normal vulva.     Vagina: No vaginal discharge.     Cervix: Normal.     Adnexa: Right adnexa normal and left adnexa normal.  Musculoskeletal:        General: No tenderness. Normal range of motion.     Cervical back: Normal range of motion and neck supple.  Lymphadenopathy:     Cervical: No cervical adenopathy.     Upper Body:     Right upper body: No axillary adenopathy.     Left upper body: No axillary adenopathy.  Skin:    General: Skin is warm and dry.     Findings: No rash.  Neurological:     Mental Status: She is alert and oriented to person, place, and time.     Cranial Nerves: No cranial nerve deficit.  Psychiatric:        Behavior: Behavior normal.     Results for orders placed or performed in visit on 07/31/19  Microscopic Examination   URINE  Result Value Ref Range   WBC, UA None seen 0 - 5 /hpf   RBC 3-10 (A) 0 - 2 /hpf   Epithelial Cells (non renal) 0-10 0 - 10 /hpf   Bacteria, UA None seen None seen/Few  UA/M w/rflx Culture, Routine   Specimen: Urine   URINE  Result Value Ref Range   Specific Gravity, UA 1.020 1.005 - 1.030   pH, UA 7.0 5.0 - 7.5   Color, UA Yellow Yellow   Appearance Ur Clear Clear   Leukocytes,UA Negative Negative   Protein,UA Negative  Negative/Trace   Glucose, UA Negative Negative   Ketones, UA Negative Negative   RBC, UA 2+ (A) Negative   Bilirubin, UA Negative Negative   Urobilinogen, Ur 0.2 0.2 - 1.0 mg/dL   Nitrite, UA Negative Negative   Microscopic Examination See below:       Assessment & Plan:   Problem List Items Addressed This Visit    None    Visit Diagnoses    Diarrhea, unspecified type    -  Primary   Improved with diet changes and prilosec. Continue current regimen    Annual physical exam       Relevant Orders   CBC with Differential/Platelet   Comprehensive metabolic panel   Lipid Panel w/o Chol/HDL Ratio   TSH   UA/M w/rflx Culture, Routine (Completed)   Vision changes       Will refer to Opthalmology for further evaluation   Relevant Orders   Ambulatory referral to Ophthalmology   Menstrual changes       Reassurance given   Recent URI       Requesting COVID antibody testing. Await results   Relevant Orders   SAR CoV2 Serology (COVID 19)AB(IGG)IA   History of HPV infection       Relevant Orders   Cytology - PAP   Screening for cervical cancer       Relevant Orders   Cytology - PAP   Routine screening for STI (sexually transmitted infection)       Relevant Orders   HIV Antibody (routine testing w rflx)   RPR   HSV(herpes simplex vrs) 1+2 ab-IgG   GC/Chlamydia Probe Amp       Follow up plan: Return in about 1 year (around 07/30/2020) for CPE.   LABORATORY TESTING:  - Pap smear: pap done  IMMUNIZATIONS:   - Tdap: Tetanus vaccination status reviewed: last tetanus booster within 10 years. - Influenza: Refused  PATIENT COUNSELING:   Advised to take 1 mg of folate supplement per day if capable of pregnancy.   Sexuality: Discussed sexually transmitted diseases, partner selection, use of condoms, avoidance of unintended pregnancy  and contraceptive alternatives.   Advised to avoid cigarette smoking.  I discussed with the patient that most people either abstain from alcohol or drink within safe limits (<=14/week and <=4 drinks/occasion for males, <=7/weeks and <= 3 drinks/occasion for females) and that the risk for alcohol disorders and other health effects rises proportionally with the number of drinks per week and how often a drinker exceeds daily limits.  Discussed cessation/primary prevention of drug use and availability of treatment for abuse.   Diet: Encouraged to adjust caloric intake to maintain  or achieve ideal body weight, to  reduce intake of dietary saturated fat and total fat, to limit sodium intake by avoiding high sodium foods and not adding table salt, and to maintain adequate dietary potassium and calcium preferably from fresh fruits, vegetables, and low-fat dairy products.    stressed the importance of regular exercise  Injury prevention: Discussed safety belts, safety helmets, smoke detector, smoking near bedding or upholstery.   Dental health: Discussed importance of regular tooth brushing, flossing, and dental visits.    NEXT PREVENTATIVE PHYSICAL DUE IN 1 YEAR. Return in about 1 year (around 07/30/2020) for CPE.

## 2019-08-03 DIAGNOSIS — F411 Generalized anxiety disorder: Secondary | ICD-10-CM | POA: Diagnosis not present

## 2019-08-03 LAB — COMPREHENSIVE METABOLIC PANEL
ALT: 12 IU/L (ref 0–32)
AST: 18 IU/L (ref 0–40)
Albumin/Globulin Ratio: 2.1 (ref 1.2–2.2)
Albumin: 4.5 g/dL (ref 3.9–5.0)
Alkaline Phosphatase: 50 IU/L (ref 39–117)
BUN/Creatinine Ratio: 13 (ref 9–23)
BUN: 12 mg/dL (ref 6–20)
Bilirubin Total: 0.5 mg/dL (ref 0.0–1.2)
CO2: 20 mmol/L (ref 20–29)
Calcium: 9.4 mg/dL (ref 8.7–10.2)
Chloride: 107 mmol/L — ABNORMAL HIGH (ref 96–106)
Creatinine, Ser: 0.89 mg/dL (ref 0.57–1.00)
GFR calc Af Amer: 103 mL/min/{1.73_m2} (ref >59)
GFR calc non Af Amer: 89 mL/min/{1.73_m2} (ref >59)
Globulin, Total: 2.1 g/dL (ref 1.5–4.5)
Glucose: 89 mg/dL (ref 65–99)
Potassium: 4.5 mmol/L (ref 3.5–5.2)
Sodium: 140 mmol/L (ref 134–144)
Total Protein: 6.6 g/dL (ref 6.0–8.5)

## 2019-08-03 LAB — CBC WITH DIFFERENTIAL/PLATELET
Basophils Absolute: 0.1 10*3/uL (ref 0.0–0.2)
Basos: 1 %
EOS (ABSOLUTE): 0.2 10*3/uL (ref 0.0–0.4)
Eos: 4 %
Hematocrit: 38.1 % (ref 34.0–46.6)
Hemoglobin: 13.5 g/dL (ref 11.1–15.9)
Immature Grans (Abs): 0 10*3/uL (ref 0.0–0.1)
Immature Granulocytes: 0 %
Lymphocytes Absolute: 1.9 10*3/uL (ref 0.7–3.1)
Lymphs: 33 %
MCH: 31.3 pg (ref 26.6–33.0)
MCHC: 35.4 g/dL (ref 31.5–35.7)
MCV: 88 fL (ref 79–97)
Monocytes Absolute: 0.4 10*3/uL (ref 0.1–0.9)
Monocytes: 8 %
Neutrophils Absolute: 3.2 10*3/uL (ref 1.4–7.0)
Neutrophils: 54 %
Platelets: 259 10*3/uL (ref 150–450)
RBC: 4.32 x10E6/uL (ref 3.77–5.28)
RDW: 13.1 % (ref 11.7–15.4)
WBC: 5.8 10*3/uL (ref 3.4–10.8)

## 2019-08-03 LAB — LIPID PANEL W/O CHOL/HDL RATIO
Cholesterol, Total: 155 mg/dL (ref 100–199)
HDL: 46 mg/dL (ref >39)
LDL Chol Calc (NIH): 97 mg/dL (ref 0–99)
Triglycerides: 61 mg/dL (ref 0–149)
VLDL Cholesterol Cal: 12 mg/dL (ref 5–40)

## 2019-08-03 LAB — CYTOLOGY - PAP
Comment: NEGATIVE
High risk HPV: NEGATIVE

## 2019-08-03 LAB — TSH: TSH: 0.745 u[IU]/mL (ref 0.450–4.500)

## 2019-08-03 LAB — SAR COV2 SEROLOGY (COVID19)AB(IGG),IA: DiaSorin SARS-CoV-2 Ab, IgG: NEGATIVE

## 2019-08-03 LAB — HSV(HERPES SIMPLEX VRS) I + II AB-IGG
HSV 1 Glycoprotein G Ab, IgG: 1.04 index — ABNORMAL HIGH (ref 0.00–0.90)
HSV 2 IgG, Type Spec: <0.91 index (ref 0.00–0.90)

## 2019-08-03 LAB — GC/CHLAMYDIA PROBE AMP
Chlamydia trachomatis, NAA: NEGATIVE
Neisseria Gonorrhoeae by PCR: NEGATIVE

## 2019-08-03 LAB — HIV ANTIBODY (ROUTINE TESTING W REFLEX): HIV Screen 4th Generation wRfx: NONREACTIVE

## 2019-08-03 LAB — RPR: RPR Ser Ql: NONREACTIVE

## 2019-08-10 DIAGNOSIS — F411 Generalized anxiety disorder: Secondary | ICD-10-CM | POA: Diagnosis not present

## 2019-08-31 DIAGNOSIS — F411 Generalized anxiety disorder: Secondary | ICD-10-CM | POA: Diagnosis not present

## 2019-09-07 DIAGNOSIS — F411 Generalized anxiety disorder: Secondary | ICD-10-CM | POA: Diagnosis not present

## 2019-09-13 ENCOUNTER — Other Ambulatory Visit: Payer: Self-pay

## 2019-09-13 MED ORDER — CETIRIZINE HCL 10 MG PO TABS: 10.0000 mg | ORAL_TABLET | Freq: Every day | ORAL | 11 refills | Status: DC

## 2019-09-13 NOTE — Telephone Encounter (Signed)
Refill request for Cetirizine  LOV: 07/31/2019 Next Appt: none

## 2019-09-14 DIAGNOSIS — F411 Generalized anxiety disorder: Secondary | ICD-10-CM | POA: Diagnosis not present

## 2019-09-21 DIAGNOSIS — F411 Generalized anxiety disorder: Secondary | ICD-10-CM | POA: Diagnosis not present

## 2019-09-28 DIAGNOSIS — F411 Generalized anxiety disorder: Secondary | ICD-10-CM | POA: Diagnosis not present

## 2019-10-04 DIAGNOSIS — F411 Generalized anxiety disorder: Secondary | ICD-10-CM | POA: Diagnosis not present

## 2019-10-06 DIAGNOSIS — S0502XA Injury of conjunctiva and corneal abrasion without foreign body, left eye, initial encounter: Secondary | ICD-10-CM | POA: Diagnosis not present

## 2019-10-12 DIAGNOSIS — F411 Generalized anxiety disorder: Secondary | ICD-10-CM | POA: Diagnosis not present

## 2019-10-13 DIAGNOSIS — H5203 Hypermetropia, bilateral: Secondary | ICD-10-CM | POA: Diagnosis not present

## 2019-10-16 DIAGNOSIS — H5213 Myopia, bilateral: Secondary | ICD-10-CM | POA: Diagnosis not present

## 2019-10-19 DIAGNOSIS — F411 Generalized anxiety disorder: Secondary | ICD-10-CM | POA: Diagnosis not present

## 2019-10-24 DIAGNOSIS — F411 Generalized anxiety disorder: Secondary | ICD-10-CM | POA: Diagnosis not present

## 2019-10-26 DIAGNOSIS — F411 Generalized anxiety disorder: Secondary | ICD-10-CM | POA: Diagnosis not present

## 2019-11-02 DIAGNOSIS — F411 Generalized anxiety disorder: Secondary | ICD-10-CM | POA: Diagnosis not present

## 2019-11-08 ENCOUNTER — Encounter: Payer: Self-pay | Admitting: Nurse Practitioner

## 2019-11-08 ENCOUNTER — Ambulatory Visit (INDEPENDENT_AMBULATORY_CARE_PROVIDER_SITE_OTHER): Payer: Medicaid Other | Admitting: Nurse Practitioner

## 2019-11-08 DIAGNOSIS — J069 Acute upper respiratory infection, unspecified: Secondary | ICD-10-CM | POA: Diagnosis not present

## 2019-11-08 DIAGNOSIS — H52223 Regular astigmatism, bilateral: Secondary | ICD-10-CM | POA: Diagnosis not present

## 2019-11-08 MED ORDER — HYDROCOD POLST-CPM POLST ER 10-8 MG/5ML PO SUER: 5.0000 mL | Freq: Two times a day (BID) | ORAL | 0 refills | Status: DC | PRN

## 2019-11-08 MED ORDER — FLUTICASONE PROPIONATE 50 MCG/ACT NA SUSP: 2.0000 | Freq: Every day | NASAL | 6 refills | Status: DC

## 2019-11-08 NOTE — Patient Instructions (Signed)

## 2019-11-08 NOTE — Assessment & Plan Note (Addendum)
Acute x 24 hours.  Educated her on antibiotic use and reasoning not to use at this time, if ongoing symptoms x 7 days she is to alert provider and schedule follow-up, at that time would consider abx regimen.  Recommend she obtain Covid testing ASAP and self quarantine until symptoms have improved.  At this time: - Increased rest - Increasing Fluids - Acetaminophen / ibuprofen as needed for fever/pain.  - Salt water gargling, chloraseptic spray and throat lozenges - OTC Zyrtec - Mucinex.  - Scripts sent for [employer] and Tussionex Return to office for worsening or ongoing symptoms.

## 2019-11-08 NOTE — Progress Notes (Signed)
There were no vitals taken for this visit.   Subjective:    [name], female    DOB: 07-Jan-1991, 29 y.o.   MRN: [id number]  HPI: Alyssa Kemp is a 29 y.o. female  Chief Complaint  Patient presents with  . Cough  . Nasal Congestion  . Sore Throat    . This visit was completed via telephone due to the restrictions of the COVID-19 pandemic. All issues as above were discussed and addressed but no physical exam was performed. If it was felt that the patient should be evaluated in the office, they were directed there. The patient verbally consented to this visit. Patient was unable to complete an audio/visual visit due to Technical difficulties,Lack of internet. Due to the catastrophic nature of the COVID-19 pandemic, this visit was done through audio contact only. . Location of the patient: home . Location of the provider: work . Those involved with this call:  . Provider: Aura Dials, DNP . CMA: Wilhemena Durie, CMA . Front Desk/Registration: Adela Ports  . Time spent on call: 15 minutes on the phone discussing health concerns. 10 minutes total spent in review of patient's record and preparation of their chart.   UPPER RESPIRATORY TRACT INFECTION She reports yesterday she woke-up with stuffy nose and then this transitioned into a cough + sore throat.  Currently takes Zyrtec every day for allergies.  Has not taken Covid vaccine, reports she is allergic to flu shot -- got really sick with it.  Reports she was told not to take flu shot again.  No loss of taste or smell.  Denies current pregnancy or breastfeeding. Fever: no Cough: yes Shortness of breath: no Wheezing: no Chest pain: no Chest tightness: no Chest congestion: no Nasal congestion: yes Runny nose: no Post nasal drip: yes Sneezing: no Sore throat: yes Swollen glands: no Sinus pressure: yes Headache: no Face pain: no Toothache: no Ear pain: none Ear pressure: none Eyes  red/itching:no Eye drainage/crusting: no  Vomiting: no Rash: no Fatigue: yes Sick contacts: no Strep contacts: no  Context: fluctuating Recurrent sinusitis: no Relief with OTC cold/cough medications: has not taken  Treatments attempted: none   Relevant past medical, surgical, family and social history reviewed and updated as indicated. Interim medical history since our last visit reviewed. Allergies and medications reviewed and updated.  Review of Systems  Constitutional: Positive for fatigue. Negative for activity change, appetite change, chills and fever.  HENT: Positive for congestion, postnasal drip and sinus pressure. Negative for ear discharge, ear pain, facial swelling, rhinorrhea, sinus pain, sneezing, sore throat and voice change.   Eyes: Negative for pain and visual disturbance.  Respiratory: Positive for cough. Negative for chest tightness, shortness of breath and wheezing.   Cardiovascular: Negative for chest pain, palpitations and leg swelling.  Gastrointestinal: Negative.   Endocrine: Negative.   Musculoskeletal: Negative for myalgias.  Neurological: Negative for dizziness, numbness and headaches.  Psychiatric/Behavioral: Negative.     Per HPI unless specifically indicated above     Objective:    There were no vitals taken for this visit.  Wt Readings from Last 3 Encounters:  07/31/19 161 lb (73 kg)  06/30/19 167 lb (75.8 kg)  03/14/19 175 lb (79.4 kg)    Physical Exam   Unable to perform due to telephone visit only  Results for orders placed or performed in visit on 07/31/19  GC/Chlamydia Probe Amp   Specimen: Urine   UR  Result Value Ref Range  Chlamydia trachomatis, NAA Negative Negative   Neisseria Gonorrhoeae by PCR Negative Negative  Microscopic Examination   URINE  Result Value Ref Range   WBC, UA None seen 0 - 5 /hpf   RBC 3-10 (A) 0 - 2 /hpf   Epithelial Cells (non renal) 0-10 0 - 10 /hpf   Bacteria, UA None seen None seen/Few  CBC with  Differential/Platelet  Result Value Ref Range   WBC 5.8 3.4 - 10.8 x10E3/uL   RBC 4.32 3.77 - 5.28 x10E6/uL   Hemoglobin 13.5 11.1 - 15.9 g/dL   Hematocrit 16.1 09.6 - 46.6 %   MCV 88 79 - 97 fL   MCH 31.3 26.6 - 33.0 pg   MCHC 35.4 31.5 - 35.7 g/dL   RDW 04.5 40.9 - 81.1 %   Platelets 259 150 - 450 x10E3/uL   Neutrophils 54 Not Estab. %   Lymphs 33 Not Estab. %   Monocytes 8 Not Estab. %   Eos 4 Not Estab. %   Basos 1 Not Estab. %   Neutrophils Absolute 3.2 1.4 - 7.0 x10E3/uL   Lymphocytes Absolute 1.9 0.7 - 3.1 x10E3/uL   Monocytes Absolute 0.4 0.1 - 0.9 x10E3/uL   EOS (ABSOLUTE) 0.2 0.0 - 0.4 x10E3/uL   Basophils Absolute 0.1 0.0 - 0.2 x10E3/uL   Immature Granulocytes 0 Not Estab. %   Immature Grans (Abs) 0.0 0.0 - 0.1 x10E3/uL  Comprehensive metabolic panel  Result Value Ref Range   Glucose 89 65 - 99 mg/dL   BUN 12 6 - 20 mg/dL   Creatinine, Ser 9.14 0.57 - 1.00 mg/dL   GFR calc non Af Amer 89 >59 mL/min/1.73   GFR calc Af Amer 103 >59 mL/min/1.73   BUN/Creatinine Ratio 13 9 - 23   Sodium 140 134 - 144 mmol/L   Potassium 4.5 3.5 - 5.2 mmol/L   Chloride 107 (H) 96 - 106 mmol/L   CO2 20 20 - 29 mmol/L   Calcium 9.4 8.7 - 10.2 mg/dL   Total Protein 6.6 6.0 - 8.5 g/dL   Albumin 4.5 3.9 - 5.0 g/dL   Globulin, Total 2.1 1.5 - 4.5 g/dL   Albumin/Globulin Ratio 2.1 1.2 - 2.2   Bilirubin Total 0.5 0.0 - 1.2 mg/dL   Alkaline Phosphatase 50 39 - 117 IU/L   AST 18 0 - 40 IU/L   ALT 12 0 - 32 IU/L  Lipid Panel w/o Chol/HDL Ratio  Result Value Ref Range   Cholesterol, Total 155 100 - 199 mg/dL   Triglycerides 61 0 - 149 mg/dL   HDL 46 >78 mg/dL   VLDL Cholesterol Cal 12 5 - 40 mg/dL   LDL Chol Calc (NIH) 97 0 - 99 mg/dL  TSH  Result Value Ref Range   TSH 0.745 0.450 - 4.500 uIU/mL  UA/M w/rflx Culture, Routine   Specimen: Urine   URINE  Result Value Ref Range   Specific Gravity, UA 1.020 1.005 - 1.030   pH, UA 7.0 5.0 - 7.5   Color, UA Yellow Yellow   Appearance Ur  Clear Clear   Leukocytes,UA Negative Negative   Protein,UA Negative Negative/Trace   Glucose, UA Negative Negative   Ketones, UA Negative Negative   RBC, UA 2+ (A) Negative   Bilirubin, UA Negative Negative   Urobilinogen, Ur 0.2 0.2 - 1.0 mg/dL   Nitrite, UA Negative Negative   Microscopic Examination See below:   SAR CoV2 Serology (COVID 19)AB(IGG)IA  Result Value Ref Range   DiaSorin  SARS-CoV-2 Ab, IgG Negative Negative  HIV Antibody (routine testing w rflx)  Result Value Ref Range   HIV Screen 4th Generation wRfx Non Reactive Non Reactive  RPR  Result Value Ref Range   RPR Ser Ql Non Reactive Non Reactive  HSV(herpes simplex vrs) 1+2 ab-IgG  Result Value Ref Range   HSV 1 Glycoprotein G Ab, IgG 1.04 (H) 0.00 - 0.90 index   HSV 2 IgG, Type Spec <0.91 0.00 - 0.90 index  Cytology - PAP  Result Value Ref Range   High risk HPV Negative    Adequacy      Satisfactory for evaluation; transformation zone component PRESENT.   Diagnosis - Low grade squamous intraepithelial lesion (LSIL) (A)    Comment Normal Reference Range HPV - Negative       Assessment & Plan:   Problem List Items Addressed This Visit      Respiratory   Upper respiratory tract infection - Primary    Acute x 24 hours.  Educated her on antibiotic use and reasoning not to use at this time, if ongoing symptoms x 7 days she is to alert provider and schedule follow-up, at that time would consider abx regimen.  Recommend she obtain Covid testing ASAP and self quarantine until symptoms have improved.  At this time: - Increased rest - Increasing Fluids - Acetaminophen / ibuprofen as needed for fever/pain.  - Salt water gargling, chloraseptic spray and throat lozenges - OTC Zyrtec - Mucinex.  - Scripts sent for [facility] and Tussionex Return to office for worsening or ongoing symptoms.          I discussed the assessment and treatment plan with the patient. The patient was provided an opportunity to ask questions  and all were answered. The patient agreed with the plan and demonstrated an understanding of the instructions.   The patient was advised to call back or seek an in-person evaluation if the symptoms worsen or if the condition fails to improve as anticipated.   I provided 15+ minutes of time during this encounter.  Follow up plan: Return if symptoms worsen or fail to improve.

## 2019-11-09 DIAGNOSIS — F411 Generalized anxiety disorder: Secondary | ICD-10-CM | POA: Diagnosis not present

## 2019-11-14 ENCOUNTER — Telehealth: Payer: Self-pay | Admitting: Family Medicine

## 2019-11-14 ENCOUNTER — Other Ambulatory Visit: Payer: Self-pay | Admitting: Family Medicine

## 2019-11-14 NOTE — Telephone Encounter (Signed)
Pt has apt on 11/16/19.

## 2019-11-14 NOTE — Telephone Encounter (Signed)
Patient's last visit was 11/08/19

## 2019-11-14 NOTE — Telephone Encounter (Signed)
Medication Refill - Medication: chlorpheniramine-HYDROcodone (TUSSIONEX PENNKINETIC ER) 10-8 MG/5ML SUER   Preferred Pharmacy (with phone number or street name):  [id number] [place], [place] - [address] AT [facility] Phone:  [phone]  Fax:  [phone]       Agent: Please be advised that RX refills may take up to 3 business days. We ask that you follow-up with your pharmacy.

## 2019-11-14 NOTE — Telephone Encounter (Signed)
Called pt scheduled virtual for Thursday. Asked pt if she went to get covid test done, she states that she did go to [facility] in [place] and her result was negative.

## 2019-11-14 NOTE — Telephone Encounter (Signed)
Copied from CRM #[id number]. Topic: General - Other >> Nov 14, 2019  9:26 AM Harris, Brenda J wrote: Reason for CRM: Patient called to let the doctor know that she is not much better after her appt. Last week.  Stated she is still coughing and put in a request for more cough medicine.  Would like the nurse to call to see if the doctor wants her to make an appt.  Please advise and call to let patient know.  CB# [phone] 

## 2019-11-14 NOTE — Telephone Encounter (Signed)
Copied from CRM [id number]. Topic: General - Other >> Nov 14, 2019  9:26 AM Tamela Oddi wrote: Reason for CRM: Patient called to let the doctor know that she is not much better after her appt. Last week.  Stated she is still coughing and put in a request for more cough medicine.  Would like the nurse to call to see if the doctor wants her to make an appt.  Please advise and call to let patient know.  CB# [phone]

## 2019-11-14 NOTE — Telephone Encounter (Signed)
Please advised pt had virtual with Jolene 5/19  Copied from CRM #[id number]. Topic: General - Other >> Nov 14, 2019  9:26 AM Tamela Oddi wrote: Reason for CRM: Patient called to let the doctor know that she is not much better after her appt. Last week.  Stated she is still coughing and put in a request for more cough medicine.  Would like the nurse to call to see if the doctor wants her to make an appt.  Please advise and call to let patient know.  CB# [phone]

## 2019-11-14 NOTE — Telephone Encounter (Signed)
Noted, thank you

## 2019-11-14 NOTE — Telephone Encounter (Signed)
Can wait for Jolene to review when she's back in office tomorrow since she saw her for this

## 2019-11-14 NOTE — Telephone Encounter (Signed)
Wrote to [place], patient will need follow-up.  Could do virtual Thursday + she was to have Covid testing and do not see this was obtained.  Continue to recommend this,

## 2019-11-14 NOTE — Telephone Encounter (Signed)
Routing to provider to advise.  

## 2019-11-14 NOTE — Telephone Encounter (Signed)
Pt has apt on 11/16/2019. 

## 2019-11-14 NOTE — Telephone Encounter (Signed)
Will need follow-up and she was to have Covid testing, but do not see this as done.  Please see if she obtained this and recommend she obtain if not.  Could do virtual Thursday.

## 2019-11-16 ENCOUNTER — Other Ambulatory Visit: Payer: Self-pay

## 2019-11-16 ENCOUNTER — Encounter: Payer: Self-pay | Admitting: Nurse Practitioner

## 2019-11-16 ENCOUNTER — Telehealth (INDEPENDENT_AMBULATORY_CARE_PROVIDER_SITE_OTHER): Payer: Medicaid Other | Admitting: Nurse Practitioner

## 2019-11-16 VITALS — Wt 151.0 lb

## 2019-11-16 DIAGNOSIS — J069 Acute upper respiratory infection, unspecified: Secondary | ICD-10-CM

## 2019-11-16 DIAGNOSIS — F411 Generalized anxiety disorder: Secondary | ICD-10-CM | POA: Diagnosis not present

## 2019-11-16 DIAGNOSIS — R195 Other fecal abnormalities: Secondary | ICD-10-CM | POA: Diagnosis not present

## 2019-11-16 MED ORDER — AZITHROMYCIN 250 MG PO TABS: ORAL_TABLET | ORAL | 0 refills | Status: DC

## 2019-11-16 MED ORDER — HYDROCOD POLST-CPM POLST ER 10-8 MG/5ML PO SUER: 5.0000 mL | Freq: Two times a day (BID) | ORAL | 0 refills | Status: DC | PRN

## 2019-11-16 MED ORDER — PREDNISONE 20 MG PO TABS: 40.0000 mg | ORAL_TABLET | Freq: Every day | ORAL | 0 refills | Status: AC

## 2019-11-16 NOTE — Assessment & Plan Note (Addendum)
Reports this as chronic issue.  ? IBS.  Recommend keeping diary and assessing for trigger items.  May take Imodium as needed, but do so sparsely.  Recommend use of daily probiotic or probiotic yogurt.  If ongoing issues recommend she return to office to further discuss with PCP and obtain labs.

## 2019-11-16 NOTE — Patient Instructions (Signed)
Diet for Irritable Bowel Syndrome  When you have irritable bowel syndrome (IBS), it is very important to eat the foods and follow the eating habits that are best for your condition. IBS may cause various symptoms such as pain in the abdomen, constipation, or diarrhea. Choosing the right foods can help to ease the discomfort from these symptoms. Work with your health care provider and diet and nutrition specialist (dietitian) to find the eating plan that will help to control your symptoms. What are tips for following this plan?      Keep a food diary. This will help you identify foods that cause symptoms. Write down: ? What you eat and when you eat it. ? What symptoms you have. ? When symptoms occur in relation to your meals, such as "pain in abdomen 2 hours after dinner."  Eat your meals slowly and in a relaxed setting.  Aim to eat 5-6 small meals per day. Do not skip meals.  Drink enough fluid to keep your urine pale yellow.  Ask your health care provider if you should take an over-the-counter probiotic to help restore healthy bacteria in your gut (digestive tract). ? Probiotics are foods that contain good bacteria and yeasts.  Your dietitian may have specific dietary recommendations for you based on your symptoms. He or she may recommend that you: ? Avoid foods that cause symptoms. Talk with your dietitian about other ways to get the same nutrients that are in those problem foods. ? Avoid foods with gluten. Gluten is a protein that is found in rye, wheat, and barley. ? Eat more foods that contain soluble fiber. Examples of foods with high soluble fiber include oats, seeds, and certain fruits and vegetables. Take a fiber supplement if directed by your dietitian. ? Reduce or avoid certain foods called FODMAPs. These are foods that contain carbohydrates that are hard to digest. Ask your doctor which foods contain these carbohydrates. What foods are not recommended? The following are some  foods and drinks that may make your symptoms worse:  Fatty foods, such as french fries.  Foods that contain gluten, such as pasta and cereal.  Dairy products, such as milk, cheese, and ice cream.  Chocolate.  Alcohol.  Products with caffeine, such as coffee.  Carbonated drinks, such as soda.  Foods that are high in FODMAPs. These include certain fruits and vegetables.  Products with sweeteners such as honey, high fructose corn syrup, sorbitol, and mannitol. The items listed above may not be a complete list of foods and beverages you should avoid. Contact a dietitian for more information. What foods are good sources of fiber? Your health care provider or dietitian may recommend that you eat more foods that contain fiber. Fiber can help to reduce constipation and other IBS symptoms. Add foods with fiber to your diet a little at a time so your body can get used to them. Too much fiber at one time might cause gas and swelling of your abdomen. The following are some foods that are good sources of fiber:  Berries, such as raspberries, strawberries, and blueberries.  Tomatoes.  Carrots.  Brown rice.  Oats.  Seeds, such as chia and pumpkin seeds. The items listed above may not be a complete list of recommended sources of fiber. Contact your dietitian for more options. Where to find more information  International Foundation for Functional Gastrointestinal Disorders: www.iffgd.org  National Institute of Diabetes and Digestive and Kidney Diseases: www.niddk.nih.gov Summary  When you have irritable bowel syndrome (IBS), it   is very important to eat the foods and follow the eating habits that are best for your condition.  IBS may cause various symptoms such as pain in the abdomen, constipation, or diarrhea.  Choosing the right foods can help to ease the discomfort that comes from symptoms.  Keep a food diary. This will help you identify foods that cause symptoms.  Your health  care provider or diet and nutrition specialist (dietitian) may recommend that you eat more foods that contain fiber. This information is not intended to replace advice given to you by your health care provider. Make sure you discuss any questions you have with your health care provider. Document Revised: 09/28/2018 Document Reviewed: 02/09/2017 Elsevier Patient Education  2020 [employer]  

## 2019-11-16 NOTE — Progress Notes (Signed)
Wt 151 lb (68.5 kg)   BMI 26.33 kg/m    Subjective:    [name], female    DOB: [birth year], 29 y.o.   MRN: [id number]  HPI: Alyssa Kemp is a 29 y.o. female  Chief Complaint  Patient presents with  . Cough    pt states she is still having a lingering cough, finished cough syrup yesterday    . This visit was completed via MyChart due to the restrictions of the COVID-19 pandemic. All issues as above were discussed and addressed. Physical exam was done as above through visual confirmation on MyChart. If it was felt that the patient should be evaluated in the office, they were directed there. The patient verbally consented to this visit. . Location of the patient: home . Location of the provider: home . Those involved with this call:  . Provider: Marnee Guarneri, DNP . CMA: Yvonna Alanis, CMA . Front Desk/Registration: Don Perking  . Time spent on call: 15 minutes with patient face to face via video conference. More than 50% of this time was spent in counseling and coordination of care. 10 minutes total spent in review of patient's record and preparation of their chart.  . I verified patient identity using two factors (patient name and date of birth). Patient consents verbally to being seen via telemedicine visit today.    COUGH Was treated on 11/08/19 for URI with simple methods, at time had symptoms less than 7 days and no abx provided.  She was prescribed Tussionex and Flonase.  Not as congested, but still had cough.  Covid testing returned negative at [facility].    She reports having some loose stool, which is an ongoing issue for her and states in past was told to avoid lactose, which she has been doing but this has offered no benefit.  Has about 3 loose BM at day which is associated with some cramping.  Can not eat pizza as this triggers it + notices more in morning. Duration: days Circumstances of initial development of cough: URI Cough  severity: mild Cough description: hacking and dry Aggravating factors:  worse at night Alleviating factors: cough syrup and nasal CCS Status:  fluctuating Treatments attempted: cough syrup and nasal CCS Wheezing: no Shortness of breath: no Chest pain: no Chest tightness:no Nasal congestion: no Runny nose: no Postnasal drip: yes Frequent throat clearing or swallowing: yes Hemoptysis: no Fevers: no Night sweats: no Weight loss: no Heartburn: no Recent foreign travel: no Tuberculosis contacts: no  Relevant past medical, surgical, family and social history reviewed and updated as indicated. Interim medical history since our last visit reviewed. Allergies and medications reviewed and updated.  Review of Systems  Constitutional: Negative for activity change, appetite change, chills, fatigue and fever.  HENT: Positive for postnasal drip. Negative for congestion, ear discharge, ear pain, facial swelling, rhinorrhea, sinus pressure, sinus pain, sneezing, sore throat and voice change.   Eyes: Negative for pain and visual disturbance.  Respiratory: Positive for cough. Negative for chest tightness, shortness of breath and wheezing.   Cardiovascular: Negative for chest pain, palpitations and leg swelling.  Gastrointestinal: Positive for diarrhea. Negative for abdominal distention, abdominal pain, constipation, nausea and vomiting.  Endocrine: Negative.   Musculoskeletal: Negative for myalgias.  Neurological: Negative for dizziness, numbness and headaches.  Psychiatric/Behavioral: Negative.     Per HPI unless specifically indicated above     Objective:    Wt 151 lb (68.5 kg)   BMI 26.33 kg/m  Wt Readings from Last 3 Encounters:  11/16/19 151 lb (68.5 kg)  07/31/19 161 lb (73 kg)  06/30/19 167 lb (75.8 kg)    Physical Exam Vitals and nursing note reviewed.  Constitutional:      General: She is awake. She is not in acute distress.    Appearance: She is well-developed. She is  not ill-appearing.  HENT:     Head: Normocephalic.     Right Ear: Hearing normal.     Left Ear: Hearing normal.  Eyes:     General: Lids are normal.        Right eye: No discharge.        Left eye: No discharge.     Conjunctiva/sclera: Conjunctivae normal.  Pulmonary:     Effort: Pulmonary effort is normal. No accessory muscle usage or respiratory distress.  Musculoskeletal:     Cervical back: Normal range of motion.  Neurological:     Mental Status: She is alert and oriented to person, place, and time.  Psychiatric:        Attention and Perception: Attention normal.        Mood and Affect: Mood normal.        Behavior: Behavior normal. Behavior is cooperative.        Thought Content: Thought content normal.        Judgment: Judgment normal.     Results for orders placed or performed in visit on 07/31/19  GC/Chlamydia Probe Amp   Specimen: Urine   UR  Result Value Ref Range   Chlamydia trachomatis, NAA Negative Negative   Neisseria Gonorrhoeae by PCR Negative Negative  Microscopic Examination   URINE  Result Value Ref Range   WBC, UA None seen 0 - 5 /hpf   RBC 3-10 (A) 0 - 2 /hpf   Epithelial Cells (non renal) 0-10 0 - 10 /hpf   Bacteria, UA None seen None seen/Few  CBC with Differential/Platelet  Result Value Ref Range   WBC 5.8 3.4 - 10.8 x10E3/uL   RBC 4.32 3.77 - 5.28 x10E6/uL   Hemoglobin 13.5 11.1 - 15.9 g/dL   Hematocrit 10.2 58.5 - 46.6 %   MCV 88 79 - 97 fL   MCH 31.3 26.6 - 33.0 pg   MCHC 35.4 31.5 - 35.7 g/dL   RDW 27.7 82.4 - 23.5 %   Platelets 259 150 - 450 x10E3/uL   Neutrophils 54 Not Estab. %   Lymphs 33 Not Estab. %   Monocytes 8 Not Estab. %   Eos 4 Not Estab. %   Basos 1 Not Estab. %   Neutrophils Absolute 3.2 1.4 - 7.0 x10E3/uL   Lymphocytes Absolute 1.9 0.7 - 3.1 x10E3/uL   Monocytes Absolute 0.4 0.1 - 0.9 x10E3/uL   EOS (ABSOLUTE) 0.2 0.0 - 0.4 x10E3/uL   Basophils Absolute 0.1 0.0 - 0.2 x10E3/uL   Immature Granulocytes 0 Not Estab. %     Immature Grans (Abs) 0.0 0.0 - 0.1 x10E3/uL  Comprehensive metabolic panel  Result Value Ref Range   Glucose 89 65 - 99 mg/dL   BUN 12 6 - 20 mg/dL   Creatinine, Ser 3.61 0.57 - 1.00 mg/dL   GFR calc non Af Amer 89 >59 mL/min/1.73   GFR calc Af Amer 103 >59 mL/min/1.73   BUN/Creatinine Ratio 13 9 - 23   Sodium 140 134 - 144 mmol/L   Potassium 4.5 3.5 - 5.2 mmol/L   Chloride 107 (H) 96 - 106 mmol/L   CO2 20  20 - 29 mmol/L   Calcium 9.4 8.7 - 10.2 mg/dL   Total Protein 6.6 6.0 - 8.5 g/dL   Albumin 4.5 3.9 - 5.0 g/dL   Globulin, Total 2.1 1.5 - 4.5 g/dL   Albumin/Globulin Ratio 2.1 1.2 - 2.2   Bilirubin Total 0.5 0.0 - 1.2 mg/dL   Alkaline Phosphatase 50 39 - 117 IU/L   AST 18 0 - 40 IU/L   ALT 12 0 - 32 IU/L  Lipid Panel w/o Chol/HDL Ratio  Result Value Ref Range   Cholesterol, Total 155 100 - 199 mg/dL   Triglycerides 61 0 - 149 mg/dL   HDL 46 >74 mg/dL   VLDL Cholesterol Cal 12 5 - 40 mg/dL   LDL Chol Calc (NIH) 97 0 - 99 mg/dL  TSH  Result Value Ref Range   TSH 0.745 0.450 - 4.500 uIU/mL  UA/M w/rflx Culture, Routine   Specimen: Urine   URINE  Result Value Ref Range   Specific Gravity, UA 1.020 1.005 - 1.030   pH, UA 7.0 5.0 - 7.5   Color, UA Yellow Yellow   Appearance Ur Clear Clear   Leukocytes,UA Negative Negative   Protein,UA Negative Negative/Trace   Glucose, UA Negative Negative   Ketones, UA Negative Negative   RBC, UA 2+ (A) Negative   Bilirubin, UA Negative Negative   Urobilinogen, Ur 0.2 0.2 - 1.0 mg/dL   Nitrite, UA Negative Negative   Microscopic Examination See below:   SAR CoV2 Serology (COVID 19)AB(IGG)IA  Result Value Ref Range   DiaSorin SARS-CoV-2 Ab, IgG Negative Negative  HIV Antibody (routine testing w rflx)  Result Value Ref Range   HIV Screen 4th Generation wRfx Non Reactive Non Reactive  RPR  Result Value Ref Range   RPR Ser Ql Non Reactive Non Reactive  HSV(herpes simplex vrs) 1+2 ab-IgG  Result Value Ref Range   HSV 1  Glycoprotein G Ab, IgG 1.04 (H) 0.00 - 0.90 index   HSV 2 IgG, Type Spec <0.91 0.00 - 0.90 index  Cytology - PAP  Result Value Ref Range   High risk HPV Negative    Adequacy      Satisfactory for evaluation; transformation zone component PRESENT.   Diagnosis - Low grade squamous intraepithelial lesion (LSIL) (A)    Comment Normal Reference Range HPV - Negative       Assessment & Plan:   Problem List Items Addressed This Visit      Respiratory   Upper respiratory tract infection - Primary    Acute with ongoing, lingering cough > 7 days.  Covid negative on testing per her report.  Will send in scripts for Zpack and Prednisone + refills Tussionex.  At this time: - Increased rest - Increasing Fluids - Acetaminophen / ibuprofen as needed for fever/pain.  - Salt water gargling, chloraseptic spray and throat lozenges - OTC Zyrtec - Mucinex.  Return to office for worsening or ongoing symptoms.       Relevant Medications   azithromycin (ZITHROMAX) 250 MG tablet     Other   Loose stools    Reports this as chronic issue.  ? IBS.  Recommend keeping diary and assessing for trigger items.  May take Imodium as needed, but do so sparsely.  Recommend use of daily probiotic or probiotic yogurt.  If ongoing issues recommend she return to office to further discuss with PCP and obtain labs.         I discussed the assessment and treatment plan  with the patient. The patient was provided an opportunity to ask questions and all were answered. The patient agreed with the plan and demonstrated an understanding of the instructions.   The patient was advised to call back or seek an in-person evaluation if the symptoms worsen or if the condition fails to improve as anticipated.   I provided 15+ minutes of time during this encounter.  Follow up plan: Return if symptoms worsen or fail to improve.

## 2019-11-16 NOTE — Assessment & Plan Note (Addendum)
Acute with ongoing, lingering cough > 7 days.  Covid negative on testing per her report.  Will send in scripts for Zpack and Prednisone + refills Tussionex.  At this time: - Increased rest - Increasing Fluids - Acetaminophen / ibuprofen as needed for fever/pain.  - Salt water gargling, chloraseptic spray and throat lozenges - OTC Zyrtec - Mucinex.  Return to office for worsening or ongoing symptoms.

## 2019-11-23 DIAGNOSIS — F411 Generalized anxiety disorder: Secondary | ICD-10-CM | POA: Diagnosis not present

## 2019-12-05 DIAGNOSIS — F411 Generalized anxiety disorder: Secondary | ICD-10-CM | POA: Diagnosis not present

## 2019-12-06 DIAGNOSIS — W57XXXA Bitten or stung by nonvenomous insect and other nonvenomous arthropods, initial encounter: Secondary | ICD-10-CM | POA: Diagnosis not present

## 2019-12-06 DIAGNOSIS — S70362A Insect bite (nonvenomous), left thigh, initial encounter: Secondary | ICD-10-CM | POA: Diagnosis not present

## 2019-12-06 DIAGNOSIS — N3001 Acute cystitis with hematuria: Secondary | ICD-10-CM | POA: Diagnosis not present

## 2019-12-06 DIAGNOSIS — R350 Frequency of micturition: Secondary | ICD-10-CM | POA: Diagnosis not present

## 2019-12-08 ENCOUNTER — Ambulatory Visit: Payer: Medicaid Other | Admitting: Nurse Practitioner

## 2019-12-13 ENCOUNTER — Telehealth: Payer: Self-pay | Admitting: Family Medicine

## 2019-12-13 ENCOUNTER — Other Ambulatory Visit: Payer: Self-pay

## 2019-12-13 ENCOUNTER — Emergency Department
Admission: EM | Admit: 2019-12-13 | Discharge: 2019-12-13 | Disposition: A | Discharge status: Home / Self Care | Payer: Medicaid Other | Attending: Emergency Medicine | Admitting: Emergency Medicine

## 2019-12-13 ENCOUNTER — Encounter: Payer: Self-pay | Admitting: Emergency Medicine

## 2019-12-13 DIAGNOSIS — Z882 Allergy status to sulfonamides status: Secondary | ICD-10-CM | POA: Diagnosis not present

## 2019-12-13 DIAGNOSIS — Z881 Allergy status to other antibiotic agents status: Secondary | ICD-10-CM | POA: Diagnosis not present

## 2019-12-13 DIAGNOSIS — R21 Rash and other nonspecific skin eruption: Secondary | ICD-10-CM | POA: Diagnosis not present

## 2019-12-13 DIAGNOSIS — E119 Type 2 diabetes mellitus without complications: Secondary | ICD-10-CM | POA: Diagnosis not present

## 2019-12-13 DIAGNOSIS — Z7984 Long term (current) use of oral hypoglycemic drugs: Secondary | ICD-10-CM | POA: Diagnosis not present

## 2019-12-13 MED ORDER — PREDNISONE 10 MG PO TABS
ORAL_TABLET | ORAL | 0 refills | Status: DC
Start: 2019-12-13 — End: 2019-12-21

## 2019-12-13 MED ORDER — DIPHENHYDRAMINE HCL 25 MG PO CAPS
50.0000 mg | ORAL_CAPSULE | Freq: Once | ORAL | Status: AC
  Administered 2019-12-13: 50 mg via ORAL
  Filled 2019-12-13: qty 2

## 2019-12-13 MED ORDER — HYDROXYZINE HCL 25 MG PO TABS
25.0000 mg | ORAL_TABLET | Freq: Four times a day (QID) | ORAL | 0 refills | Status: DC | PRN
Start: 2019-12-13 — End: 2019-12-21

## 2019-12-13 MED ORDER — DEXAMETHASONE SODIUM PHOSPHATE 10 MG/ML IJ SOLN
10.0000 mg | Freq: Once | INTRAMUSCULAR | Status: AC
  Administered 2019-12-13: 10 mg via INTRAMUSCULAR
  Filled 2019-12-13: qty 1

## 2019-12-13 NOTE — ED Triage Notes (Signed)
Patient ambulatory to triage with steady gait, without difficulty or distress noted; pt reports generalized itchy rash since last night; awoke with increasing rash with no known cause; took zyrtec PTA; reports just completed ds of antibiotics for UTI but unsure of name

## 2019-12-13 NOTE — Discharge Instructions (Signed)
Follow-up with your primary care provider and if this continues you may need to see a allergy specialist to see what you are allergic to.  The Atarax is similar to Benadryl and should be taken every 6 hours as needed for itching.  Begin taking the prednisone taper as directed beginning with 6 tablets and tapering down over the next 6 days.  Avoid hot showers as this can cause itching to be worse.  Return to the emergency department if any worsening of your symptoms.

## 2019-12-13 NOTE — ED Notes (Signed)
See triage note  Presents with possible allergic rxn   States she noticed hives/rash with itching last pm and this am   No resp issues no new meds   States she just finished Macrobid for UTI

## 2019-12-13 NOTE — ED Provider Notes (Signed)
[facility] Emergency Department Provider Note  ____________________________________________   First MD Initiated Contact with Patient 12/13/19 [phone]     (approximate)  I have reviewed the triage vital signs and the nursing notes.   HISTORY  Chief Complaint Allergic Reaction   HPI Alyssa Kemp is a 29 y.o. female presents to the ED with possible allergic reaction.  Patient states that she noticed hives and itching last evening which is not improved today.  She states she just finished a prescription for Macrobid to treat a UTI.  She denies any other new medicines or foods.  Patient has had difficulties taking antibiotics in the past.  She denies any difficulty breathing or swallowing.  Patient did take Zyrtec prior to arrival.       Past Medical History:  Diagnosis Date  . Bipolar depression ([place])   . GERD (gastroesophageal reflux disease)   . Gestational diabetes   . Gestational diabetes mellitus (GDM) affecting pregnancy, antepartum 09/05/2016  . History of macrosomia in infant in prior pregnancy, currently pregnant   . 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 Scoliosis     Patient Active Problem List   Diagnosis Date Noted  . Loose stools 11/16/2019  . Upper respiratory tract infection 11/08/2019  . Allergic rhinitis 09/08/2018  . Obesity (BMI 30.0-34.9) 03/31/2018  . Encounter for sterilization 09/19/2016  . Bipolar 1 disorder ([place]) 09/05/2016  . Rh negative state in antepartum period 09/05/2016  . GERD (gastroesophageal reflux disease) 08/28/2016  . 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 Anxiety, generalized 11/05/2015  . 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/05/2015  . History of chlamydia infection 11/05/2015  . History of abnormal Pap smear 02/21/2013    Past Surgical History:  Procedure Laterality Date  . ADENOIDECTOMY    . 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 CYSTOSCOPY W/ RETROGRADES Bilateral  12/28/2017   Procedure: CYSTOSCOPY WITH RETROGRADE PYELOGRAM;  Surgeon: Abbie Sons, MD;  Location: [facility] ORS;  Service: Urology;  Laterality: Bilateral;  . TONSILLECTOMY    . TUBAL LIGATION Bilateral 09/19/2016   Procedure: POST PARTUM TUBAL LIGATION;  Surgeon: Will Bonnet, MD;  Location: [facility] ORS;  Service: Gynecology;  Laterality: Bilateral;  . WISDOM TOOTH EXTRACTION      Prior to Admission medications   Medication Sig Start Date End Date Taking? Authorizing Provider  cetirizine (ZYRTEC) 10 MG tablet Take 1 tablet (10 mg total) by mouth daily. 09/13/19   Volney American, PA-C  fluticasone [facility]) 50 MCG/ACT nasal spray Place 2 sprays into both nostrils daily. 11/08/19   Marnee Guarneri T, NP  hydrOXYzine (ATARAX/VISTARIL) 25 MG tablet Take 1 tablet (25 mg total) by mouth every 6 (six) hours as needed for itching. 12/13/19   Johnn Hai, PA-C  lansoprazole (PREVACID) 15 MG capsule Take 15 mg by mouth daily at 12 noon.    [provider]  Multiple Vitamin (MULTIVITAMINS PO) Take by mouth daily.    [provider]  predniSONE (DELTASONE) 10 MG tablet Take 6 tablets  today, on day 2 take 5 tablets, day 3 take 4 tablets, day 4 take 3 tablets, day 5 take  2 tablets and 1 tablet the last day 12/13/19   Johnn Hai, PA-C    Allergies Sulfacetamide sodium, Sulfasalazine, Doxycycline, and Amoxicillin-pot clavulanate  Family History  Problem Relation Age of Onset  . Cervical cancer Mother   . Ovarian cancer Mother   .  Hypertension Maternal Grandmother   . Pancreatic cancer Maternal Grandmother   . Diabetes Maternal Grandmother   . Coronary artery disease Maternal Grandfather   . Congestive Heart Failure Maternal Grandfather   . Hypertension Maternal Grandfather   . Prostate cancer Maternal Grandfather   . Diabetes Maternal Grandfather   . Hypertension Paternal Grandmother   . Diabetes Paternal Grandmother   . Heart disease Paternal Grandfather     . Hypertension Paternal Grandfather   . Breast cancer Paternal Aunt     Social History Social History   Tobacco Use  . Smoking status: Never Smoker  . Smokeless tobacco: Never Used  Vaping Use  . Vaping Use: Never used  Substance Use Topics  . Alcohol use: No  . Drug use: No    Review of Systems Constitutional: No fever/chills Eyes: No visual changes. ENT: No sore throat. Cardiovascular: Denies chest pain. Respiratory: Denies shortness of breath. Gastrointestinal: No abdominal pain.  No nausea, no vomiting.  No diarrhea.  Genitourinary: Negative for dysuria. Musculoskeletal: Negative for muscle aches. Skin: Positive for rash. Neurological: Negative for headaches, focal weakness or numbness.  ____________________________________________   PHYSICAL EXAM:  VITAL SIGNS: ED Triage Vitals  Enc Vitals Group     BP 12/13/19 0631 132/69     Pulse Rate 12/13/19 0631 78     Resp 12/13/19 0631 18     Temp 12/13/19 0631 98.2 F (36.8 C)     Temp Source 12/13/19 0631 Oral     SpO2 12/13/19 0631 100 %     Weight 12/13/19 0630 151 lb (68.5 kg)     Height 12/13/19 0630 5\' 4"  (1.626 m)     Head Circumference --      Peak Flow --      Pain Score 12/13/19 0630 0     Pain Loc --      Pain Edu? --      Excl. in GC? --     Constitutional: Alert and oriented. Well appearing and in no acute distress.  Patient is not having any respiratory difficulty and is able to speak in complete sentences without any difficulty. Eyes: Conjunctivae are normal. PERRL. EOMI. Head: Atraumatic. Nose: No congestion/rhinnorhea. Mouth/Throat: Mucous membranes are moist.  Oropharynx non-erythematous. Neck: No stridor.   Cardiovascular: Normal rate, regular rhythm. Grossly normal heart sounds.  Good peripheral circulation. Respiratory: Normal respiratory effort.  No retractions. Lungs CTAB. Gastrointestinal: Soft and nontender. No distention. No abdominal bruits. No CVA tenderness. Musculoskeletal:  Moves upper and lower extremities with any difficulty normal gait was noted. Neurologic:  Normal speech and language. No gross focal neurologic deficits are appreciated. No gait instability. Skin:  Skin is warm, dry and intact.  Erythematous, irregular macular rash is noted over the torso, upper and lower extremities.  No vesicles or weeping is noted. Psychiatric: Mood and affect are normal. Speech and behavior are normal.  ____________________________________________   LABS (all labs ordered are listed, but only abnormal results are displayed)  Labs Reviewed - No data to display ____________________________________________  PROCEDURES  Procedure(s) performed (including Critical Care):  Procedures   ____________________________________________   INITIAL IMPRESSION / ASSESSMENT AND PLAN / ED COURSE  As part of my medical decision making, I reviewed the following data within the electronic MEDICAL RECORD NUMBER Notes from prior ED visits and [place] Controlled Substance Database  29 year old female presents to the ED with complaint of rash after finishing a course of Macrobid for UTI.  Patient has had difficulty taking antibiotics in  the past.  She states that the rash this morning is itching but denies any difficulty breathing.  Patient was given Decadron 10 mg IM and Benadryl 25 mg.  Patient prior to discharge was getting relief of her itching.  A prescription for continued prednisone and Atarax as needed for itching was sent to her pharmacy.  This possibly was a allergic reaction to Macrobid.  Patient was told that if she continues having problems she will need to see her PCP for referral for allergy testing.  ____________________________________________   FINAL CLINICAL IMPRESSION(S) / ED DIAGNOSES  Final diagnoses:  Rash and nonspecific skin eruption     ED Discharge Orders         Ordered    predniSONE (DELTASONE) 10 MG tablet     Discontinue  Reprint     12/13/19 0822     hydrOXYzine (ATARAX/VISTARIL) 25 MG tablet  Every 6 hours PRN     Discontinue  Reprint     12/13/19 1791           Note:  This document was prepared using Dragon voice recognition software and may include unintentional dictation errors.    Tommi Rumps, PA-C 12/13/19 1047    Concha Se, MD 12/14/19 [phone]

## 2019-12-13 NOTE — Telephone Encounter (Signed)
Copied from CRM [id number]. Topic: Referral - Request for Referral >> Dec 13, 2019 11:19 AM Angela Nevin wrote: Has patient seen PCP for this complaint? no *If NO, is insurance requiring patient see PCP for this issue before PCP can refer them? Referral for which specialty:ENT Preferred provider/office: [facility]  Reason for referral: patient was seen in ED and advised see needs a referral for ENT due to hives. (?)

## 2019-12-13 NOTE — Telephone Encounter (Signed)
Lvm to make this apt. 

## 2019-12-14 ENCOUNTER — Ambulatory Visit: Payer: Medicaid Other | Admitting: Family Medicine

## 2019-12-14 DIAGNOSIS — F411 Generalized anxiety disorder: Secondary | ICD-10-CM | POA: Diagnosis not present

## 2019-12-15 ENCOUNTER — Other Ambulatory Visit: Payer: Self-pay

## 2019-12-15 ENCOUNTER — Encounter: Payer: Self-pay | Admitting: Family Medicine

## 2019-12-15 ENCOUNTER — Ambulatory Visit (INDEPENDENT_AMBULATORY_CARE_PROVIDER_SITE_OTHER): Payer: Medicaid Other | Admitting: Family Medicine

## 2019-12-15 VITALS — BP 118/68 | HR 63 | Temp 97.7°F | Wt 152.0 lb

## 2019-12-15 DIAGNOSIS — L509 Urticaria, unspecified: Secondary | ICD-10-CM | POA: Diagnosis not present

## 2019-12-15 MED ORDER — EPINEPHRINE 0.3 MG/0.3ML IJ SOAJ: 0.3000 mg | INTRAMUSCULAR | 1 refills | Status: AC | PRN

## 2019-12-15 NOTE — Progress Notes (Signed)
BP 118/68   Pulse 63   Temp 97.7 F (36.5 C) (Oral)   Wt 152 lb (68.9 kg)   LMP 12/12/2019 (Exact Date)   SpO2 98%   BMI 26.09 kg/m    Subjective:    [name], female    DOB: [birth year], 29 y.o.   MRN: [id number]  HPI: Alyssa Kemp is a 29 y.o. female  Chief Complaint  Patient presents with  . Allergies    pt states she was at ER due to allergies 2 days ago   Here today for ER f/u for significant hives reaction she experienced 2 days ago. Started on macrobid for a UTI at [facility] and after completion of that course, started having itching and head to toe hives rash 2 days later. Tried zyrtec with minimal relief so went to ER and was given decadron and sent home with prednisone taper which has resolved sxs. Did use some bubble bath when she ran out of body wash and had tried some new peppers in a recipe but otherwise no new exposures noted. Has taken macrobid in the past without issue and has had med allergy testing recently which did not show macrobid allergy.   Relevant past medical, surgical, family and social history reviewed and updated as indicated. Interim medical history since our last visit reviewed. Allergies and medications reviewed and updated.  Review of Systems  Per HPI unless specifically indicated above     Objective:    BP 118/68   Pulse 63   Temp 97.7 F (36.5 C) (Oral)   Wt 152 lb (68.9 kg)   LMP 12/12/2019 (Exact Date)   SpO2 98%   BMI 26.09 kg/m   Wt Readings from Last 3 Encounters:  12/15/19 152 lb (68.9 kg)  12/13/19 151 lb (68.5 kg)  11/16/19 151 lb (68.5 kg)    Physical Exam Vitals and nursing note reviewed.  Constitutional:      Appearance: Normal appearance. She is not ill-appearing.  HENT:     Head: Atraumatic.  Eyes:     Extraocular Movements: Extraocular movements intact.     Conjunctiva/sclera: Conjunctivae normal.  Cardiovascular:     Rate and Rhythm: Normal rate and regular rhythm.     Heart sounds: Normal  heart sounds.  Pulmonary:     Effort: Pulmonary effort is normal.     Breath sounds: Normal breath sounds.  Musculoskeletal:        General: Normal range of motion.     Cervical back: Normal range of motion and neck supple.  Skin:    General: Skin is warm and dry.  Neurological:     Mental Status: She is alert and oriented to person, place, and time.  Psychiatric:        Mood and Affect: Mood normal.        Thought Content: Thought content normal.        Judgment: Judgment normal.     Results for orders placed or performed in visit on 07/31/19  GC/Chlamydia Probe Amp   Specimen: Urine   UR  Result Value Ref Range   Chlamydia trachomatis, NAA Negative Negative   Neisseria Gonorrhoeae by PCR Negative Negative  Microscopic Examination   URINE  Result Value Ref Range   WBC, UA None seen 0 - 5 /hpf   RBC 3-10 (A) 0 - 2 /hpf   Epithelial Cells (non renal) 0-10 0 - 10 /hpf   Bacteria, UA None seen None seen/Few  CBC with Differential/Platelet  Result Value Ref Range   WBC 5.8 3.4 - 10.8 x10E3/uL   RBC 4.32 3.77 - 5.28 x10E6/uL   Hemoglobin 13.5 11.1 - 15.9 g/dL   Hematocrit 38.1 34.0 - 46.6 %   MCV 88 79 - 97 fL   MCH 31.3 26.6 - 33.0 pg   MCHC 35.4 31 - 35 g/dL   RDW 13.1 11.7 - 15.4 %   Platelets 259 150 - 450 x10E3/uL   Neutrophils 54 Not Estab. %   Lymphs 33 Not Estab. %   Monocytes 8 Not Estab. %   Eos 4 Not Estab. %   Basos 1 Not Estab. %   Neutrophils Absolute 3.2 1 - 7 x10E3/uL   Lymphocytes Absolute 1.9 0 - 3 x10E3/uL   Monocytes Absolute 0.4 0 - 0 x10E3/uL   EOS (ABSOLUTE) 0.2 0.0 - 0.4 x10E3/uL   Basophils Absolute 0.1 0 - 0 x10E3/uL   Immature Granulocytes 0 Not Estab. %   Immature Grans (Abs) 0.0 0.0 - 0.1 x10E3/uL  Comprehensive metabolic panel  Result Value Ref Range   Glucose 89 65 - 99 mg/dL   BUN 12 6 - 20 mg/dL   Creatinine, Ser 0.89 0.57 - 1.00 mg/dL   GFR calc non Af Amer 89 >59 mL/min/1.73   GFR calc Af Amer 103 >59 mL/min/1.73    BUN/Creatinine Ratio 13 9 - 23   Sodium 140 134 - 144 mmol/L   Potassium 4.5 3.5 - 5.2 mmol/L   Chloride 107 (H) 96 - 106 mmol/L   CO2 20 20 - 29 mmol/L   Calcium 9.4 8.7 - 10.2 mg/dL   Total Protein 6.6 6.0 - 8.5 g/dL   Albumin 4.5 3.9 - 5.0 g/dL   Globulin, Total 2.1 1.5 - 4.5 g/dL   Albumin/Globulin Ratio 2.1 1.2 - 2.2   Bilirubin Total 0.5 0.0 - 1.2 mg/dL   Alkaline Phosphatase 50 39 - 117 IU/L   AST 18 0 - 40 IU/L   ALT 12 0 - 32 IU/L  Lipid Panel w/o Chol/HDL Ratio  Result Value Ref Range   Cholesterol, Total 155 100 - 199 mg/dL   Triglycerides 61 0 - 149 mg/dL   HDL 46 >39 mg/dL   VLDL Cholesterol Cal 12 5 - 40 mg/dL   LDL Chol Calc (NIH) 97 0 - 99 mg/dL  TSH  Result Value Ref Range   TSH 0.745 0.450 - 4.500 uIU/mL  UA/M w/rflx Culture, Routine   Specimen: Urine   URINE  Result Value Ref Range   Specific Gravity, UA 1.020 1.005 - 1.030   pH, UA 7.0 5.0 - 7.5   Color, UA Yellow Yellow   Appearance Ur Clear Clear   Leukocytes,UA Negative Negative   Protein,UA Negative Negative/Trace   Glucose, UA Negative Negative   Ketones, UA Negative Negative   RBC, UA 2+ (A) Negative   Bilirubin, UA Negative Negative   Urobilinogen, Ur 0.2 0.2 - 1.0 mg/dL   Nitrite, UA Negative Negative   Microscopic Examination See below:   SAR CoV2 Serology (COVID 19)AB(IGG)IA  Result Value Ref Range   DiaSorin SARS-CoV-2 Ab, IgG Negative Negative  HIV Antibody (routine testing w rflx)  Result Value Ref Range   HIV Screen 4th Generation wRfx Non Reactive Non Reactive  RPR  Result Value Ref Range   RPR Ser Ql Non Reactive Non Reactive  HSV(herpes simplex vrs) 1+2 ab-IgG  Result Value Ref Range   HSV 1 Glycoprotein G Ab, IgG 1.04 (  H) 0.00 - 0.90 index   HSV 2 IgG, Type Spec <0.91 0.00 - 0.90 index  Cytology - PAP  Result Value Ref Range   High risk HPV Negative    Adequacy      Satisfactory for evaluation; transformation zone component PRESENT.   Diagnosis - Low grade squamous  intraepithelial lesion (LSIL) (A)    Comment Normal Reference Range HPV - Negative       Assessment & Plan:   Problem List Items Addressed This Visit    None    Visit Diagnoses    Hives    -  Primary   Given severity of reaction, will supply epi pens. Directions given of when/how to use. Referral back to Allergist sent per her request for further testing   Relevant Orders   Ambulatory referral to Allergy       Follow up plan: Return if symptoms worsen or fail to improve.

## 2019-12-19 ENCOUNTER — Telehealth: Payer: Self-pay | Admitting: Family Medicine

## 2019-12-19 NOTE — Telephone Encounter (Signed)
Will route to referral coordinator

## 2019-12-19 NOTE — Telephone Encounter (Signed)
Routing to provider  

## 2019-12-19 NOTE — Telephone Encounter (Signed)
Copied from CRM [id number]. Topic: General - Other >> Dec 19, 2019  9:30 AM Jaquita Rector A wrote: Reason for CRM: Patient called to inform Roosvelt Maser that she received a call from [facility] but that they do not do actual allergy testing through blood work or any other means so she is asking if she can be referred to an actual allergist that will test her and find out the origin of her allergic reaction and any allergies that she may have. Please call patient at Ph# [phone]

## 2019-12-21 ENCOUNTER — Telehealth: Payer: Medicaid Other | Admitting: Family Medicine

## 2019-12-21 ENCOUNTER — Emergency Department
Admission: EM | Admit: 2019-12-21 | Discharge: 2019-12-21 | Disposition: A | Discharge status: Home / Self Care | Payer: Medicaid Other | Attending: Emergency Medicine | Admitting: Emergency Medicine

## 2019-12-21 ENCOUNTER — Encounter: Payer: Self-pay | Admitting: Family Medicine

## 2019-12-21 ENCOUNTER — Telehealth (INDEPENDENT_AMBULATORY_CARE_PROVIDER_SITE_OTHER): Payer: Medicaid Other | Admitting: Family Medicine

## 2019-12-21 ENCOUNTER — Other Ambulatory Visit: Payer: Self-pay

## 2019-12-21 VITALS — Wt 152.0 lb

## 2019-12-21 DIAGNOSIS — R05 Cough: Secondary | ICD-10-CM | POA: Diagnosis present

## 2019-12-21 DIAGNOSIS — R112 Nausea with vomiting, unspecified: Secondary | ICD-10-CM | POA: Insufficient documentation

## 2019-12-21 DIAGNOSIS — J069 Acute upper respiratory infection, unspecified: Secondary | ICD-10-CM

## 2019-12-21 DIAGNOSIS — Z20822 Contact with and (suspected) exposure to covid-19: Secondary | ICD-10-CM | POA: Diagnosis not present

## 2019-12-21 LAB — URINALYSIS, COMPLETE (UACMP) WITH MICROSCOPIC
Bilirubin Urine: NEGATIVE
Glucose, UA: NEGATIVE mg/dL
Hgb urine dipstick: NEGATIVE
Ketones, ur: NEGATIVE mg/dL
Leukocytes,Ua: NEGATIVE
Nitrite: NEGATIVE
Protein, ur: NEGATIVE mg/dL
Specific Gravity, Urine: 1.02 (ref 1.005–1.030)
pH: 9 — ABNORMAL HIGH (ref 5.0–8.0)

## 2019-12-21 LAB — CBC
HCT: 37.2 % (ref 36.0–46.0)
Hemoglobin: 13.9 g/dL (ref 12.0–15.0)
MCH: 31.4 pg (ref 26.0–34.0)
MCHC: 37.4 g/dL — ABNORMAL HIGH (ref 30.0–36.0)
MCV: 84.2 fL (ref 80.0–100.0)
Platelets: 296 10*3/uL (ref 150–400)
RBC: 4.42 MIL/uL (ref 3.87–5.11)
RDW: 13.3 % (ref 11.5–15.5)
WBC: 18.2 10*3/uL — ABNORMAL HIGH (ref 4.0–10.5)
nRBC: 0 % (ref 0.0–0.2)

## 2019-12-21 LAB — COMPREHENSIVE METABOLIC PANEL
ALT: 23 U/L (ref 0–44)
AST: 20 U/L (ref 15–41)
Albumin: 4 g/dL (ref 3.5–5.0)
Alkaline Phosphatase: 38 U/L (ref 38–126)
Anion gap: 8 (ref 5–15)
BUN: 14 mg/dL (ref 6–20)
CO2: 26 mmol/L (ref 22–32)
Calcium: 8.9 mg/dL (ref 8.9–10.3)
Chloride: 105 mmol/L (ref 98–111)
Creatinine, Ser: 0.76 mg/dL (ref 0.44–1.00)
GFR calc Af Amer: >60 mL/min (ref >60)
GFR calc non Af Amer: >60 mL/min (ref >60)
Glucose, Bld: 129 mg/dL — ABNORMAL HIGH (ref 70–99)
Potassium: 4.4 mmol/L (ref 3.5–5.1)
Sodium: 139 mmol/L (ref 135–145)
Total Bilirubin: 0.9 mg/dL (ref 0.3–1.2)
Total Protein: 6.7 g/dL (ref 6.5–8.1)

## 2019-12-21 LAB — LIPASE, BLOOD: Lipase: 31 U/L (ref 11–51)

## 2019-12-21 LAB — SARS CORONAVIRUS 2 (TAT 6-24 HRS): SARS Coronavirus 2: NEGATIVE

## 2019-12-21 LAB — POCT PREGNANCY, URINE: Preg Test, Ur: NEGATIVE

## 2019-12-21 MED ORDER — ONDANSETRON 4 MG PO TBDP: 4.0000 mg | ORAL_TABLET | Freq: Three times a day (TID) | ORAL | 0 refills | Status: DC | PRN

## 2019-12-21 MED ORDER — LIDOCAINE VISCOUS HCL 2 % MT SOLN
15.0000 mL | Freq: Once | OROMUCOSAL | Status: AC
  Administered 2019-12-21: 15 mL via ORAL
  Filled 2019-12-21: qty 15

## 2019-12-21 MED ORDER — ONDANSETRON HCL 4 MG/2ML IJ SOLN
4.0000 mg | Freq: Once | INTRAMUSCULAR | Status: AC
  Administered 2019-12-21: 4 mg via INTRAVENOUS
  Filled 2019-12-21: qty 2

## 2019-12-21 MED ORDER — ONDANSETRON 4 MG PO TBDP
4.0000 mg | ORAL_TABLET | Freq: Once | ORAL | Status: AC | PRN
  Administered 2019-12-21: 4 mg via ORAL
  Filled 2019-12-21: qty 1

## 2019-12-21 MED ORDER — SODIUM CHLORIDE 0.9% FLUSH: 3.0000 mL | Freq: Once | INTRAVENOUS | Status: DC

## 2019-12-21 MED ORDER — LACTATED RINGERS IV BOLUS
1000.0000 mL | Freq: Once | INTRAVENOUS | Status: AC
  Administered 2019-12-21: 1000 mL via INTRAVENOUS

## 2019-12-21 MED ORDER — CETIRIZINE-PSEUDOEPHEDRINE ER 5-120 MG PO TB12: 1.0000 | ORAL_TABLET | Freq: Two times a day (BID) | ORAL | 0 refills | Status: AC

## 2019-12-21 MED ORDER — HYDROCOD POLST-CPM POLST ER 10-8 MG/5ML PO SUER: 5.0000 mL | Freq: Every evening | ORAL | 0 refills | Status: DC | PRN

## 2019-12-21 MED ORDER — ALUM & MAG HYDROXIDE-SIMETH 200-200-20 MG/5ML PO SUSP
15.0000 mL | Freq: Once | ORAL | Status: AC
  Administered 2019-12-21: 15 mL via ORAL
  Filled 2019-12-21: qty 30

## 2019-12-21 NOTE — ED Triage Notes (Signed)
Pt c/o body aches with sinus congestion, sore throat that started on Tuesday and since 2am having N/v. Denies diarrhea.

## 2019-12-21 NOTE — ED Provider Notes (Signed)
[facility] Emergency Department Provider Note   ____________________________________________   First MD Initiated Contact with Patient 12/21/19 [phone]     (approximate)  I have reviewed the triage vital signs and the nursing notes.   HISTORY  Chief Complaint URI and Emesis    HPI Alyssa Kemp is a 29 y.o. female with possible history of bipolar disorder and GERD who presents to the ED complaining of nausea and vomiting.  Patient reports that she has had a couple days of body aches as well as upper airway congestion, but overnight developed persistent nausea and vomiting.  She states she has not been able to tolerate p.o. since last night and vomited up the Zofran she was given in triage.  She feels like her abdomen is sore from vomiting and complains of burning pain moving up into her chest.  She has not had any diarrhea, denies fevers, chills, cough, shortness of breath, dysuria, or hematuria.  She is not aware of any sick contacts, has not yet vaccinated for COVID-19.        Past Medical History:  Diagnosis Date  . Bipolar depression (HCC)   . GERD (gastroesophageal reflux disease)   . Gestational diabetes   . Gestational diabetes mellitus (GDM) affecting pregnancy, antepartum 09/05/2016  . History of macrosomia in infant in prior pregnancy, currently pregnant   . 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 Scoliosis     Patient Active Problem List   Diagnosis Date Noted  . Loose stools 11/16/2019  . Upper respiratory tract infection 11/08/2019  . Allergic rhinitis 09/08/2018  . Obesity (BMI 30.0-34.9) 03/31/2018  . Encounter for sterilization 09/19/2016  . Bipolar 1 disorder (HCC) 09/05/2016  . Rh negative state in antepartum period 09/05/2016  . GERD (gastroesophageal reflux disease) 08/28/2016  . 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 Anxiety, generalized 11/05/2015  . Chronic low back pain with right-sided sciatica  11/05/2015  . History of chlamydia infection 11/05/2015  . History of abnormal Pap smear 02/21/2013    Past Surgical History:  Procedure Laterality Date  . ADENOIDECTOMY    . 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 CYSTOSCOPY W/ RETROGRADES Bilateral 12/28/2017   Procedure: CYSTOSCOPY WITH RETROGRADE PYELOGRAM;  Surgeon: [name], MD;  Location: [facility] ORS;  Service: Urology;  Laterality: Bilateral;  . TONSILLECTOMY    . TUBAL LIGATION Bilateral 09/19/2016   Procedure: POST PARTUM TUBAL LIGATION;  Surgeon: [name], MD;  Location: [facility] ORS;  Service: Gynecology;  Laterality: Bilateral;  . WISDOM TOOTH EXTRACTION      Prior to Admission medications   Medication Sig Start Date End Date Taking? Authorizing Provider  cetirizine-pseudoephedrine (ZYRTEC-D ALLERGY & CONGESTION) 5-120 MG tablet Take 1 tablet by mouth 2 (two) times daily for 5 days. 12/21/19 12/26/19  Chesley Noon, MD  EPINEPHrine (EPIPEN 2-PAK) 0.3 mg/0.3 mL IJ SOAJ injection Inject 0.3 mLs (0.3 mg total) into the muscle as needed for anaphylaxis. 12/15/19   Particia Nearing, PA-C  hydrOXYzine (ATARAX/VISTARIL) 25 MG tablet Take 1 tablet (25 mg total) by mouth every 6 (six) hours as needed for itching. 12/13/19   Tommi Rumps, PA-C  Multiple Vitamin (MULTIVITAMINS PO) Take by mouth daily.    [provider]  ondansetron (ZOFRAN ODT) 4 MG disintegrating tablet Take 1 tablet (4 mg total) by mouth every 8 (eight) hours as needed for nausea or vomiting. 12/21/19   Chesley Noon, MD  predniSONE (DELTASONE) 10  MG tablet Take 6 tablets  today, on day 2 take 5 tablets, day 3 take 4 tablets, day 4 take 3 tablets, day 5 take  2 tablets and 1 tablet the last day 12/13/19   Tommi Rumps, PA-C    Allergies Sulfacetamide sodium, Sulfasalazine, Doxycycline, and Amoxicillin-pot clavulanate  Family History  Problem Relation Age of Onset  . Cervical cancer Mother   . Ovarian cancer Mother   . Hypertension Maternal  Grandmother   . Pancreatic cancer Maternal Grandmother   . Diabetes Maternal Grandmother   . Coronary artery disease Maternal Grandfather   . Congestive Heart Failure Maternal Grandfather   . Hypertension Maternal Grandfather   . Prostate cancer Maternal Grandfather   . Diabetes Maternal Grandfather   . Hypertension Paternal Grandmother   . Diabetes Paternal Grandmother   . Heart disease Paternal Grandfather   . Hypertension Paternal Grandfather   . Breast cancer Paternal Aunt     Social History Social History   Tobacco Use  . Smoking status: Never Smoker  . Smokeless tobacco: Never Used  Vaping Use  . Vaping Use: Never used  Substance Use Topics  . Alcohol use: No  . Drug use: No    Review of Systems  Constitutional: No fever/chills Eyes: No visual changes. ENT: No sore throat. Cardiovascular: Positive for chest pain. Respiratory: Denies shortness of breath. Gastrointestinal: Positive for abdominal pain, nausea, and vomiting.  No diarrhea.  No constipation. Genitourinary: Negative for dysuria. Musculoskeletal: Negative for back pain. Skin: Negative for rash. Neurological: Negative for headaches, focal weakness or numbness.  ____________________________________________   PHYSICAL EXAM:  VITAL SIGNS: ED Triage Vitals  Enc Vitals Group     BP 12/21/19 0756 111/75     Pulse Rate 12/21/19 0756 75     Resp 12/21/19 0756 18     Temp 12/21/19 0756 98 F (36.7 C)     Temp Source 12/21/19 0756 Oral     SpO2 12/21/19 0756 100 %     Weight 12/21/19 0757 152 lb (68.9 kg)     Height 12/21/19 0757 5\' 4"  (1.626 m)     Head Circumference --      Peak Flow --      Pain Score 12/21/19 0757 8     Pain Loc --      Pain Edu? --      Excl. in GC? --     Constitutional: Alert and oriented. Eyes: Conjunctivae are normal. Head: Atraumatic. Nose: No congestion/rhinnorhea. Mouth/Throat: Mucous membranes are moist. Neck: Normal ROM Cardiovascular: Normal rate, regular  rhythm. Grossly normal heart sounds. Respiratory: Normal respiratory effort.  No retractions. Lungs CTAB. Gastrointestinal: Soft and nontender. No distention. Genitourinary: deferred Musculoskeletal: No lower extremity tenderness nor edema. Neurologic:  Normal speech and language. No gross focal neurologic deficits are appreciated. Skin:  Skin is warm, dry and intact. No rash noted. Psychiatric: Mood and affect are normal. Speech and behavior are normal.  ____________________________________________   LABS (all labs ordered are listed, but only abnormal results are displayed)  Labs Reviewed  COMPREHENSIVE METABOLIC PANEL - Abnormal; Notable for the following components:      Result Value   Glucose, Bld 129 (*)    All other components within normal limits  CBC - Abnormal; Notable for the following components:   WBC 18.2 (*)    MCHC 37.4 (*)    All other components within normal limits  URINALYSIS, COMPLETE (UACMP) WITH MICROSCOPIC - Abnormal; Notable for the following components:  Color, Urine YELLOW (*)    APPearance CLOUDY (*)    pH 9.0 (*)    Bacteria, UA RARE (*)    All other components within normal limits  SARS CORONAVIRUS 2 (TAT 6-24 HRS)  LIPASE, BLOOD  POC URINE PREG, ED  POCT PREGNANCY, URINE   ____________________________________________  EKG  ED ECG REPORT I, Chesley Noon, the attending physician, personally viewed and interpreted this ECG.   Date: 12/21/2019  EKG Time: 9:20  Rate: 75  Rhythm: normal sinus rhythm  Axis: Normal  Intervals:none  ST&T Change: None   PROCEDURES  Procedure(s) performed (including Critical Care):  Procedures   ____________________________________________   INITIAL IMPRESSION / ASSESSMENT AND PLAN / ED COURSE       29 year old female with possible history of bipolar disorder and GERD presents to the ED with upper airway congestion and sinus pressure for the past couple of days, developed vomiting and inability to  tolerate p.o. with abdominal discomfort last night.  She appears well and has no focal tenderness on abdominal exam, suspect her symptoms are related to gastroenteritis or other viral illness.  Lab work thus far is reassuring, patient has leukocytosis, however has been taking steroids for recent allergic reaction.  Pregnancy testing is negative and UA without evidence of infection.  Lab work is also reassuring.  We will treat symptomatically with IV Zofran and GI cocktail for her burning chest pain, likely related to some esophagitis from vomiting.  We will also hydrate with IV fluids and reassess.  Patient feeling better following IV fluids and GI cocktail, now able to tolerate p.o. without difficulty.  COVID-19 testing is pending and patient advised to quarantine until results are known.  She was counseled to follow-up with her PCP and otherwise return to the ED for new or worsening symptoms, patient agrees with plan.      ____________________________________________   FINAL CLINICAL IMPRESSION(S) / ED DIAGNOSES  Final diagnoses:  Upper respiratory tract infection, unspecified type  Non-intractable vomiting with nausea, unspecified vomiting type     ED Discharge Orders         Ordered    cetirizine-pseudoephedrine (ZYRTEC-D ALLERGY & CONGESTION) 5-120 MG tablet  2 times daily     Discontinue  Reprint     12/21/19 1116    ondansetron (ZOFRAN ODT) 4 MG disintegrating tablet  Every 8 hours PRN     Discontinue  Reprint     12/21/19 1116           Note:  This document was prepared using Dragon voice recognition software and may include unintentional dictation errors.   Chesley Noon, MD 12/21/19 [phone]

## 2019-12-21 NOTE — Progress Notes (Signed)
Wt 152 lb (68.9 kg)   LMP 12/12/2019 (Approximate)   BMI 26.09 kg/m    Subjective:    [name], female    DOB: [birth year], 29 y.o.   MRN: [id number]  HPI: Alyssa Kemp is a 29 y.o. female  Chief Complaint  Patient presents with  . Cough    symptoms started this past Tuesday. pt states has had nausea nad vomiting today but went to the ER for this  . Nasal Congestion  . Sore Throat    . This visit was completed via MyChart due to the restrictions of the COVID-19 pandemic. All issues as above were discussed and addressed. Physical exam was done as above through visual confirmation on MyChart. If it was felt that the patient should be evaluated in the office, they were directed there. The patient verbally consented to this visit. . Location of the patient: home . Location of the provider: work . Those involved with this call:  . Provider: Roosvelt Maser, PA-C . CMA: Elton Sin, CMA . Front Desk/Registration: Harriet Pho  . Time spent on call: 15 minutes with patient face to face via video conference. More than 50% of this time was spent in counseling and coordination of care. 5 minutes total spent in review of patient's record and preparation of their chart. I verified patient identity using two factors (patient name and date of birth). Patient consents verbally to being seen via telemedicine visit today.   Patient presenting today with about 3 days of productive cough, congestion, N/V, sore throat. Went to ED this morning mainly for concern of her significant N/V, unable to hold down fluids at that time. No major findings during this visit, labs personally reviewed and benign other than COVID test pending. Given anti emetics which have resolved that issue. Main concern at this point is her nighttime cough that is keeping her awake and causing her to gag. Denies fever, chills, sweats, Cp, SOB.   Relevant past medical, surgical, family and social history reviewed  and updated as indicated. Interim medical history since our last visit reviewed. Allergies and medications reviewed and updated.  Review of Systems  Per HPI unless specifically indicated above     Objective:    Wt 152 lb (68.9 kg)   LMP 12/12/2019 (Approximate)   BMI 26.09 kg/m   Wt Readings from Last 3 Encounters:  12/21/19 152 lb (68.9 kg)  12/21/19 152 lb (68.9 kg)  12/15/19 152 lb (68.9 kg)    Physical Exam Vitals and nursing note reviewed.  Constitutional:      General: She is not in acute distress.    Appearance: Normal appearance.  HENT:     Head: Atraumatic.     Right Ear: External ear normal.     Left Ear: External ear normal.     Nose: Congestion present.     Mouth/Throat:     Mouth: Mucous membranes are moist.     Pharynx: Oropharynx is clear. Posterior oropharyngeal erythema present.  Eyes:     Extraocular Movements: Extraocular movements intact.     Conjunctiva/sclera: Conjunctivae normal.  Cardiovascular:     Comments: Unable to assess via virtual visit Pulmonary:     Effort: Pulmonary effort is normal. No respiratory distress.  Musculoskeletal:        General: Normal range of motion.     Cervical back: Normal range of motion.  Skin:    General: Skin is dry.     Findings: No  erythema.  Neurological:     Mental Status: She is alert and oriented to person, place, and time.  Psychiatric:        Mood and Affect: Mood normal.        Thought Content: Thought content normal.        Judgment: Judgment normal.     Results for orders placed or performed during the hospital encounter of 12/21/19  SARS CORONAVIRUS 2 (TAT 6-24 HRS) Nasopharyngeal Nasopharyngeal Swab   Specimen: Nasopharyngeal Swab  Result Value Ref Range   SARS Coronavirus 2 NEGATIVE NEGATIVE  Lipase, blood  Result Value Ref Range   Lipase 31 11 - 51 U/L  Comprehensive metabolic panel  Result Value Ref Range   Sodium 139 135 - 145 mmol/L   Potassium 4.4 3.5 - 5.1 mmol/L   Chloride  105 98 - 111 mmol/L   CO2 26 22 - 32 mmol/L   Glucose, Bld 129 (H) 70 - 99 mg/dL   BUN 14 6 - 20 mg/dL   Creatinine, Ser 3.01 0.44 - 1.00 mg/dL   Calcium 8.9 8.9 - 60.1 mg/dL   Total Protein 6.7 6.5 - 8.1 g/dL   Albumin 4.0 3.5 - 5.0 g/dL   AST 20 15 - 41 U/L   ALT 23 0 - 44 U/L   Alkaline Phosphatase 38 38 - 126 U/L   Total Bilirubin 0.9 0.3 - 1.2 mg/dL   GFR calc non Af Amer >60 >60 mL/min   GFR calc Af Amer >60 >60 mL/min   Anion gap 8 5 - 15  CBC  Result Value Ref Range   WBC 18.2 (H) 4.0 - 10.5 K/uL   RBC 4.42 3.87 - 5.11 MIL/uL   Hemoglobin 13.9 12.0 - 15.0 g/dL   HCT 09.3 36 - 46 %   MCV 84.2 80.0 - 100.0 fL   MCH 31.4 26.0 - 34.0 pg   MCHC 37.4 (H) 30.0 - 36.0 g/dL   RDW 23.5 57.3 - 22.0 %   Platelets 296 150 - 400 K/uL   nRBC 0.0 0.0 - 0.2 %  Urinalysis, Complete w Microscopic  Result Value Ref Range   Color, Urine YELLOW (A) YELLOW   APPearance CLOUDY (A) CLEAR   Specific Gravity, Urine 1.020 1.005 - 1.030   pH 9.0 (H) 5.0 - 8.0   Glucose, UA NEGATIVE NEGATIVE mg/dL   Hgb urine dipstick NEGATIVE NEGATIVE   Bilirubin Urine NEGATIVE NEGATIVE   Ketones, ur NEGATIVE NEGATIVE mg/dL   Protein, ur NEGATIVE NEGATIVE mg/dL   Nitrite NEGATIVE NEGATIVE   Leukocytes,Ua NEGATIVE NEGATIVE   RBC / HPF 6-10 0 - 5 RBC/hpf   WBC, UA 0-5 0 - 5 WBC/hpf   Bacteria, UA RARE (A) NONE SEEN   Squamous Epithelial / LPF 11-20 0 - 5   Mucus PRESENT   Pregnancy, urine POC  Result Value Ref Range   Preg Test, Ur NEGATIVE NEGATIVE      Assessment & Plan:   Problem List Items Addressed This Visit      Respiratory   Upper respiratory tract infection - Primary    COVID test done at ER this morning pending, tx with tussionex, allergy regimen, saline rinses, mucinex in meantime and quarantine until results are back/sxs resolve. Strict return precautions given          Follow up plan: Return if symptoms worsen or fail to improve.

## 2019-12-26 NOTE — Assessment & Plan Note (Signed)
COVID test done at ER this morning pending, tx with tussionex, allergy regimen, saline rinses, mucinex in meantime and quarantine until results are back/sxs resolve. Strict return precautions given

## 2019-12-29 ENCOUNTER — Encounter: Payer: Self-pay | Admitting: Family Medicine

## 2019-12-29 ENCOUNTER — Telehealth (INDEPENDENT_AMBULATORY_CARE_PROVIDER_SITE_OTHER): Payer: Medicaid Other | Admitting: Family Medicine

## 2019-12-29 VITALS — Wt 152.0 lb

## 2019-12-29 DIAGNOSIS — R05 Cough: Secondary | ICD-10-CM | POA: Diagnosis not present

## 2019-12-29 DIAGNOSIS — R059 Cough, unspecified: Secondary | ICD-10-CM

## 2019-12-29 MED ORDER — HYDROCOD POLST-CPM POLST ER 10-8 MG/5ML PO SUER: 5.0000 mL | Freq: Every evening | ORAL | 0 refills | Status: DC | PRN

## 2019-12-29 NOTE — Progress Notes (Signed)
Wt 152 lb (68.9 kg)    LMP 12/12/2019 (Approximate)    BMI 26.09 kg/m    Subjective:    [name], female    DOB: [birth year], 29 y.o.   MRN: [id number]  HPI: Alyssa Kemp is a 29 y.o. female  Chief Complaint  Patient presents with   Cough    productive. has tried OTC mucinex   Nasal Congestion     This visit was completed via MyChart due to the restrictions of the COVID-19 pandemic. All issues as above were discussed and addressed. Physical exam was done as above through visual confirmation on MyChart. If it was felt that the patient should be evaluated in the office, they were directed there. The patient verbally consented to this visit.  Location of the patient: home  Location of the provider: work  Those involved with this call:   Provider: Roosvelt Maser, PA-C  CMA: Elton Sin, CMA  Front Desk/Registration: Adela Ports   Time spent on call: 15 minutes with patient face to face via video conference. More than 50% of this time was spent in counseling and coordination of care. 5 minutes total spent in review of patient's record and preparation of their chart. I verified patient identity using two factors (patient name and date of birth). Patient consents verbally to being seen via telemedicine visit today.   Presenting today following up on ongoing cough. Overall feeling much better but still having some coughing, mostly at night. Taking mucinex DM which is helping quite a bit and continuing her allergy regimen. Completed the tussionex which was helping quite a bit. Denies fever, chills, CP, SOB, new sxs.   Relevant past medical, surgical, family and social history reviewed and updated as indicated. Interim medical history since our last visit reviewed. Allergies and medications reviewed and updated.  Review of Systems  Per HPI unless specifically indicated above     Objective:    Wt 152 lb (68.9 kg)    LMP 12/12/2019 (Approximate)     BMI 26.09 kg/m   Wt Readings from Last 3 Encounters:  12/29/19 152 lb (68.9 kg)  12/21/19 152 lb (68.9 kg)  12/21/19 152 lb (68.9 kg)    Physical Exam Vitals and nursing note reviewed.  Constitutional:      General: She is not in acute distress.    Appearance: Normal appearance.  HENT:     Head: Atraumatic.     Right Ear: External ear normal.     Left Ear: External ear normal.     Nose: Nose normal. No congestion.     Mouth/Throat:     Mouth: Mucous membranes are moist.     Pharynx: Oropharynx is clear. No posterior oropharyngeal erythema.  Eyes:     Extraocular Movements: Extraocular movements intact.     Conjunctiva/sclera: Conjunctivae normal.  Cardiovascular:     Comments: Unable to assess via virtual visit Pulmonary:     Effort: Pulmonary effort is normal. No respiratory distress.  Musculoskeletal:        General: Normal range of motion.     Cervical back: Normal range of motion.  Skin:    General: Skin is dry.     Findings: No erythema.  Neurological:     Mental Status: She is alert and oriented to person, place, and time.  Psychiatric:        Mood and Affect: Mood normal.        Thought Content: Thought content normal.  Judgment: Judgment normal.     Results for orders placed or performed during the hospital encounter of 12/21/19  SARS CORONAVIRUS 2 (TAT 6-24 HRS) Nasopharyngeal Nasopharyngeal Swab   Specimen: Nasopharyngeal Swab  Result Value Ref Range   SARS Coronavirus 2 NEGATIVE NEGATIVE  Lipase, blood  Result Value Ref Range   Lipase 31 11 - 51 U/L  Comprehensive metabolic panel  Result Value Ref Range   Sodium 139 135 - 145 mmol/L   Potassium 4.4 3.5 - 5.1 mmol/L   Chloride 105 98 - 111 mmol/L   CO2 26 22 - 32 mmol/L   Glucose, Bld 129 (H) 70 - 99 mg/dL   BUN 14 6 - 20 mg/dL   Creatinine, Ser 0.35 0.44 - 1.00 mg/dL   Calcium 8.9 8.9 - 00.9 mg/dL   Total Protein 6.7 6.5 - 8.1 g/dL   Albumin 4.0 3.5 - 5.0 g/dL   AST 20 15 - 41 U/L   ALT  23 0 - 44 U/L   Alkaline Phosphatase 38 38 - 126 U/L   Total Bilirubin 0.9 0.3 - 1.2 mg/dL   GFR calc non Af Amer >60 >60 mL/min   GFR calc Af Amer >60 >60 mL/min   Anion gap 8 5 - 15  CBC  Result Value Ref Range   WBC 18.2 (H) 4.0 - 10.5 K/uL   RBC 4.42 3.87 - 5.11 MIL/uL   Hemoglobin 13.9 12.0 - 15.0 g/dL   HCT 38.1 36 - 46 %   MCV 84.2 80.0 - 100.0 fL   MCH 31.4 26.0 - 34.0 pg   MCHC 37.4 (H) 30.0 - 36.0 g/dL   RDW 82.9 93.7 - 16.9 %   Platelets 296 150 - 400 K/uL   nRBC 0.0 0.0 - 0.2 %  Urinalysis, Complete w Microscopic  Result Value Ref Range   Color, Urine YELLOW (A) YELLOW   APPearance CLOUDY (A) CLEAR   Specific Gravity, Urine 1.020 1.005 - 1.030   pH 9.0 (H) 5.0 - 8.0   Glucose, UA NEGATIVE NEGATIVE mg/dL   Hgb urine dipstick NEGATIVE NEGATIVE   Bilirubin Urine NEGATIVE NEGATIVE   Ketones, ur NEGATIVE NEGATIVE mg/dL   Protein, ur NEGATIVE NEGATIVE mg/dL   Nitrite NEGATIVE NEGATIVE   Leukocytes,Ua NEGATIVE NEGATIVE   RBC / HPF 6-10 0 - 5 RBC/hpf   WBC, UA 0-5 0 - 5 WBC/hpf   Bacteria, UA RARE (A) NONE SEEN   Squamous Epithelial / LPF 11-20 0 - 5   Mucus PRESENT   Pregnancy, urine POC  Result Value Ref Range   Preg Test, Ur NEGATIVE NEGATIVE      Assessment & Plan:   Problem List Items Addressed This Visit    None    Visit Diagnoses    Cough    -  Primary   Continue mucinex, allergy regimen, supportive home care. Will send one more tussionex script for prn bedtime use. F/u if worsening or not resolving       Follow up plan: Return if symptoms worsen or fail to improve.

## 2020-01-18 DIAGNOSIS — F411 Generalized anxiety disorder: Secondary | ICD-10-CM | POA: Diagnosis not present

## 2020-01-25 DIAGNOSIS — J300 Vasomotor rhinitis: Secondary | ICD-10-CM | POA: Diagnosis not present

## 2020-01-25 DIAGNOSIS — J309 Allergic rhinitis, unspecified: Secondary | ICD-10-CM | POA: Diagnosis not present

## 2020-01-25 DIAGNOSIS — F411 Generalized anxiety disorder: Secondary | ICD-10-CM | POA: Diagnosis not present

## 2020-01-25 DIAGNOSIS — L501 Idiopathic urticaria: Secondary | ICD-10-CM | POA: Diagnosis not present

## 2020-01-25 DIAGNOSIS — Z299 Encounter for prophylactic measures, unspecified: Secondary | ICD-10-CM | POA: Diagnosis not present

## 2020-02-08 DIAGNOSIS — F411 Generalized anxiety disorder: Secondary | ICD-10-CM | POA: Diagnosis not present

## 2020-02-15 DIAGNOSIS — F411 Generalized anxiety disorder: Secondary | ICD-10-CM | POA: Diagnosis not present

## 2020-02-22 DIAGNOSIS — F411 Generalized anxiety disorder: Secondary | ICD-10-CM | POA: Diagnosis not present

## 2020-02-29 DIAGNOSIS — F411 Generalized anxiety disorder: Secondary | ICD-10-CM | POA: Diagnosis not present

## 2020-03-05 DIAGNOSIS — F411 Generalized anxiety disorder: Secondary | ICD-10-CM | POA: Diagnosis not present

## 2020-03-07 DIAGNOSIS — F411 Generalized anxiety disorder: Secondary | ICD-10-CM | POA: Diagnosis not present

## 2020-03-08 ENCOUNTER — Other Ambulatory Visit: Payer: Self-pay

## 2020-03-08 ENCOUNTER — Telehealth: Payer: Self-pay | Admitting: Family Medicine

## 2020-03-08 ENCOUNTER — Encounter: Payer: Self-pay | Admitting: Family Medicine

## 2020-03-08 ENCOUNTER — Other Ambulatory Visit: Payer: Medicaid Other

## 2020-03-08 ENCOUNTER — Telehealth (INDEPENDENT_AMBULATORY_CARE_PROVIDER_SITE_OTHER): Payer: Medicaid Other | Admitting: Family Medicine

## 2020-03-08 VITALS — Ht 64.0 in | Wt 150.0 lb

## 2020-03-08 DIAGNOSIS — Z20822 Contact with and (suspected) exposure to covid-19: Secondary | ICD-10-CM | POA: Diagnosis not present

## 2020-03-08 MED ORDER — HYDROCOD POLST-CPM POLST ER 10-8 MG/5ML PO SUER: 5.0000 mL | Freq: Every evening | ORAL | 0 refills | Status: DC | PRN

## 2020-03-08 MED ORDER — BENZONATATE 200 MG PO CAPS
200.0000 mg | ORAL_CAPSULE | Freq: Two times a day (BID) | ORAL | 0 refills | Status: DC | PRN
Start: 2020-03-08 — End: 2020-04-17

## 2020-03-08 MED ORDER — PREDNISONE 50 MG PO TABS: 50.0000 mg | ORAL_TABLET | Freq: Every day | ORAL | 0 refills | Status: DC

## 2020-03-08 NOTE — Progress Notes (Signed)
Ht 5\' 4"  (1.626 m)   Wt 150 lb (68 kg)   BMI 25.75 kg/m    Subjective:    Patient ID: , female    DOB: 05/16/1991, 29 y.o.   MRN: [id number]  HPI: Alyssa Kemp is a 29 y.o. female  Chief Complaint  Patient presents with  . covid symptoms    x3-4 days, sore throat, headache , body aches, cough, no fever, cold chills   UPPER RESPIRATORY TRACT INFECTION Duration: Started Tuesday with sore throat Worst symptom: Fever: no Cough: yes Shortness of breath: no Wheezing: no Chest pain: no Chest tightness: yes Chest congestion: yes Nasal congestion: yes Runny nose: yes Post nasal drip: yes Sneezing: no Sore throat: yes Swollen glands: no Sinus pressure: no Headache: yes Face pain: no Toothache: no Ear pain: no  Ear pressure: no  Eyes red/itching:no Eye drainage/crusting: no  Vomiting: yes Rash: no Fatigue: yes Sick contacts: yes Strep contacts: no  Context: worse Recurrent sinusitis: no Relief with OTC cold/cough medications: no  Treatments attempted: cold/sinus, mucinex, anti-histamine and pseudoephedrine   Relevant past medical, surgical, family and social history reviewed and updated as indicated. Interim medical history since our last visit reviewed. Allergies and medications reviewed and updated.  Review of Systems  Constitutional: Negative.   HENT: Positive for congestion, postnasal drip and rhinorrhea. Negative for dental problem, drooling, ear discharge, ear pain, facial swelling, hearing loss, mouth sores, nosebleeds, sinus pressure, sinus pain, sneezing, sore throat, tinnitus, trouble swallowing and voice change.   Respiratory: Positive for cough, chest tightness and shortness of breath. Negative for apnea, choking, wheezing and stridor.   Cardiovascular: Negative.   Musculoskeletal: Positive for myalgias. Negative for arthralgias, back pain, gait problem, joint swelling, neck pain and neck stiffness.  Psychiatric/Behavioral: Negative.      Per HPI unless specifically indicated above     Objective:    Ht 5\' 4"  (1.626 m)   Wt 150 lb (68 kg)   BMI 25.75 kg/m   Wt Readings from Last 3 Encounters:  03/08/20 150 lb (68 kg)  12/29/19 152 lb (68.9 kg)  12/21/19 152 lb (68.9 kg)    Physical Exam Vitals and nursing note reviewed.  Constitutional:      General: She is not in acute distress.    Appearance: Normal appearance. She is not ill-appearing, toxic-appearing or diaphoretic.  HENT:     Head: Normocephalic and atraumatic.     Right Ear: External ear normal.     Left Ear: External ear normal.     Nose: Nose normal.     Mouth/Throat:     Mouth: Mucous membranes are moist.     Pharynx: Oropharynx is clear.  Eyes:     General: No scleral icterus.       Right eye: No discharge.        Left eye: No discharge.     Conjunctiva/sclera: Conjunctivae normal.     Pupils: Pupils are equal, round, and reactive to light.  Pulmonary:     Effort: Pulmonary effort is normal. No respiratory distress.     Comments: Speaking in full sentences Musculoskeletal:        General: Normal range of motion.     Cervical back: Normal range of motion.  Skin:    Coloration: Skin is not jaundiced or pale.     Findings: No bruising, erythema, lesion or rash.  Neurological:     Mental Status: She is alert and oriented to person, place, and  time. Mental status is at baseline.  Psychiatric:        Mood and Affect: Mood normal.        Behavior: Behavior normal.        Thought Content: Thought content normal.        Judgment: Judgment normal.     Results for orders placed or performed during the hospital encounter of 12/21/19  SARS CORONAVIRUS 2 (TAT 6-24 HRS) Nasopharyngeal Nasopharyngeal Swab   Specimen: Nasopharyngeal Swab  Result Value Ref Range   SARS Coronavirus 2 NEGATIVE NEGATIVE  Lipase, blood  Result Value Ref Range   Lipase 31 11 - 51 U/L  Comprehensive metabolic panel  Result Value Ref Range   Sodium 139 135 - 145  mmol/L   Potassium 4.4 3.5 - 5.1 mmol/L   Chloride 105 98 - 111 mmol/L   CO2 26 22 - 32 mmol/L   Glucose, Bld 129 (H) 70 - 99 mg/dL   BUN 14 6 - 20 mg/dL   Creatinine, Ser 5.83 0.44 - 1.00 mg/dL   Calcium 8.9 8.9 - 09.4 mg/dL   Total Protein 6.7 6.5 - 8.1 g/dL   Albumin 4.0 3.5 - 5.0 g/dL   AST 20 15 - 41 U/L   ALT 23 0 - 44 U/L   Alkaline Phosphatase 38 38 - 126 U/L   Total Bilirubin 0.9 0.3 - 1.2 mg/dL   GFR calc non Af Amer >60 >60 mL/min   GFR calc Af Amer >60 >60 mL/min   Anion gap 8 5 - 15  CBC  Result Value Ref Range   WBC 18.2 (H) 4.0 - 10.5 K/uL   RBC 4.42 3.87 - 5.11 MIL/uL   Hemoglobin 13.9 12.0 - 15.0 g/dL   HCT 07.6 36 - 46 %   MCV 84.2 80.0 - 100.0 fL   MCH 31.4 26.0 - 34.0 pg   MCHC 37.4 (H) 30.0 - 36.0 g/dL   RDW 80.8 81.1 - 03.1 %   Platelets 296 150 - 400 K/uL   nRBC 0.0 0.0 - 0.2 %  Urinalysis, Complete w Microscopic  Result Value Ref Range   Color, Urine YELLOW (A) YELLOW   APPearance CLOUDY (A) CLEAR   Specific Gravity, Urine 1.020 1.005 - 1.030   pH 9.0 (H) 5.0 - 8.0   Glucose, UA NEGATIVE NEGATIVE mg/dL   Hgb urine dipstick NEGATIVE NEGATIVE   Bilirubin Urine NEGATIVE NEGATIVE   Ketones, ur NEGATIVE NEGATIVE mg/dL   Protein, ur NEGATIVE NEGATIVE mg/dL   Nitrite NEGATIVE NEGATIVE   Leukocytes,Ua NEGATIVE NEGATIVE   RBC / HPF 6-10 0 - 5 RBC/hpf   WBC, UA 0-5 0 - 5 WBC/hpf   Bacteria, UA RARE (A) NONE SEEN   Squamous Epithelial / LPF 11-20 0 - 5   Mucus PRESENT   Pregnancy, urine POC  Result Value Ref Range   Preg Test, Ur NEGATIVE NEGATIVE      Assessment & Plan:   Problem List Items Addressed This Visit    None    Visit Diagnoses    Suspected COVID-19 virus infection    -  Primary   Self-quarantine until results are back. Prednisone, tussionex and tessalon for comfort. Call with any concerns. Continue to monitor.        Follow up plan: Return if symptoms worsen or fail to improve.   . This visit was completed via telephone due  to the restrictions of the COVID-19 pandemic. All issues as above were discussed and addressed but  no physical exam was performed. If it was felt that the patient should be evaluated in the office, they were directed there. The patient verbally consented to this visit. Patient was unable to complete an audio/visual visit due to Lack of equipment. Due to the catastrophic nature of the COVID-19 pandemic, this visit was done through audio contact only. . Location of the patient: home . Location of the provider: work . Those involved with this call:  . Provider: Olevia Perches, DO . CMA: Wilhemena Durie, CMA . Front Desk/Registration: Adela Ports  . Time spent on call: 15 minutes on the phone discussing health concerns. 23 minutes total spent in review of patient's record and preparation of their chart.

## 2020-03-08 NOTE — Telephone Encounter (Signed)
Patient is calling to schedule virtual appointment for Cough, headache, congestion, body aches.  Patient's daughter had similar symptoms. However, tested negative on COVID. Patient was scheduled for COVID testing today in [facility]. Patient is requesting script for Tessalon Perles to control the bad cough. Please advise [phone]- [phone]

## 2020-03-11 LAB — NOVEL CORONAVIRUS, NAA: SARS-CoV-2, NAA: NOT DETECTED

## 2020-03-14 ENCOUNTER — Telehealth: Payer: Self-pay | Admitting: Family Medicine

## 2020-03-14 DIAGNOSIS — F411 Generalized anxiety disorder: Secondary | ICD-10-CM | POA: Diagnosis not present

## 2020-03-14 NOTE — Telephone Encounter (Signed)
Routing to provider  

## 2020-03-14 NOTE — Telephone Encounter (Signed)
Pt request refill  chlorpheniramine-HYDROcodone (TUSSIONEX PENNKINETIC ER) 10-8 MG/5ML SUEr  Pt states she is getting better, but she still has cough and coughing up phlegm.  [id number] [place], [place] - [address] AT [facility] Phone:  [phone]  Fax:  [phone]

## 2020-03-14 NOTE — Telephone Encounter (Signed)
Needs appt if she needs a refill.  This was just filled 6 days ago.

## 2020-03-15 NOTE — Telephone Encounter (Signed)
Please call and schedule appointment if patient is out of medication per Shanda Bumps. Should not be out until 03/18/20.

## 2020-03-15 NOTE — Telephone Encounter (Signed)
Called pt to let her know of Jessica's message she states that she will hold off and see if it gets better.

## 2020-03-28 DIAGNOSIS — F411 Generalized anxiety disorder: Secondary | ICD-10-CM | POA: Diagnosis not present

## 2020-04-04 DIAGNOSIS — F411 Generalized anxiety disorder: Secondary | ICD-10-CM | POA: Diagnosis not present

## 2020-04-11 DIAGNOSIS — F411 Generalized anxiety disorder: Secondary | ICD-10-CM | POA: Diagnosis not present

## 2020-04-17 ENCOUNTER — Ambulatory Visit (INDEPENDENT_AMBULATORY_CARE_PROVIDER_SITE_OTHER): Payer: Medicaid Other | Admitting: Nurse Practitioner

## 2020-04-17 ENCOUNTER — Other Ambulatory Visit: Payer: Self-pay

## 2020-04-17 ENCOUNTER — Encounter: Payer: Self-pay | Admitting: Nurse Practitioner

## 2020-04-17 VITALS — BP 113/75 | HR 76 | Temp 98.9°F | Resp 16 | Ht 64.0 in | Wt 154.6 lb

## 2020-04-17 DIAGNOSIS — K1379 Other lesions of oral mucosa: Secondary | ICD-10-CM | POA: Diagnosis not present

## 2020-04-17 NOTE — Assessment & Plan Note (Signed)
Suspect this is irritation from where teeth land bilaterally in posterior mouth, appears to hit side of mouth when closes.  Recommend she continue to follow with dental -- would benefit from braces for alignment. She does not wish to start medication for mood at this time.

## 2020-04-17 NOTE — Patient Instructions (Signed)
Dicyclomine tablets or capsules What is this medicine? DICYCLOMINE (dye SYE kloe meen) is used to treat bowel problems including irritable bowel syndrome. This medicine may be used for other purposes; ask your health care provider or pharmacist if you have questions. COMMON BRAND NAME(S): Bentyl What should I tell my health care provider before I take this medicine? They need to know if you have any of these conditions:  difficulty passing urine  esophagus problems or heartburn  glaucoma  heart disease, or previous heart attack  myasthenia gravis  prostate trouble  stomach infection, or obstruction  ulcerative colitis  an unusual or allergic reaction to dicyclomine, other medicines, foods, dyes, or preservatives  pregnant or trying to get pregnant  breast-feeding How should I use this medicine? Take this medicine by mouth with a glass of water. Follow the directions on the prescription label. It is best to take this medicine on an empty stomach, 30 minutes to 1 hour before meals. Take your medicine at regular intervals. Do not take your medicine more often than directed. Talk to your pediatrician regarding the use of this medicine in children. Special care may be needed. While this drug may be prescribed for children as young as 6 months of age for selected conditions, precautions do apply. Patients over 65 years old may have a stronger reaction and need a smaller dose. Overdosage: If you think you have taken too much of this medicine contact a poison control center or emergency room at once. NOTE: This medicine is only for you. Do not share this medicine with others. What if I miss a dose? If you miss a dose, take it as soon as you can. If it is almost time for your next dose, take only that dose. Do not take double or extra doses. What may interact with this medicine?  amantadine  antacids  benztropine  digoxin  disopyramide  medicines for allergies, colds and  breathing difficulties  medicines for alzheimer's disease  medicines for anxiety or sleeping problems  medicines for depression or psychotic disturbances  medicines for diarrhea  medicines for pain  metoclopramide  tegaserod This list may not describe all possible interactions. Give your health care provider a list of all the medicines, herbs, non-prescription drugs, or dietary supplements you use. Also tell them if you smoke, drink alcohol, or use illegal drugs. Some items may interact with your medicine. What should I watch for while using this medicine? You may get drowsy, dizzy, or have blurred vision. Do not drive, use machinery, or do anything that needs mental alertness until you know how this medicine affects you. To reduce the risk of dizzy or fainting spells, do not sit or stand up quickly, especially if you are an older patient. Alcohol can make you more drowsy, avoid alcoholic drinks. Stay out of bright light and wear sunglasses if this medicine makes your eyes more sensitive to light. Avoid extreme heat (hot tubs, saunas). This medicine can cause you to sweat less than normal. Your body temperature could increase to dangerous levels, which may lead to heat stroke. Antacids can stop this medicine from working. If you get an upset stomach and want to take an antacid, make sure there is an interval of at least 1 to 2 hours before or after you take this medicine. Your mouth may get dry. Chewing sugarless gum or sucking hard candy, and drinking plenty of water may help. Contact your doctor if the problem does not go away or is severe. What   side effects may I notice from receiving this medicine? Side effects that you should report to your doctor or health care professional as soon as possible:  agitation, nervousness, confusion  difficulty swallowing  dizziness, drowsiness  fast or slow heartbeat  hallucinations  pain or difficulty passing urine Side effects that usually do  not require medical attention (report to your doctor or health care professional if they continue or are bothersome):  constipation  headache  nausea or vomiting  sexual difficulty This list may not describe all possible side effects. Call your doctor for medical advice about side effects. You may report side effects to FDA at 1-800-FDA-1088. Where should I keep my medicine? Keep out of the reach of children. Store at room temperature below 30 degrees C (86 degrees F). Protect from light. Throw away any unused medicine after the expiration date. NOTE: This sheet is a summary. It may not cover all possible information. If you have questions about this medicine, talk to your doctor, pharmacist, or health care provider.  2020 Elsevier/Gold Standard (2007-09-27 17:12:34)  

## 2020-04-17 NOTE — Progress Notes (Signed)
BP 113/75 (BP Location: Left Arm, Patient Position: Sitting, Cuff Size: Normal)   Pulse 76   Temp 98.9 F (37.2 C) (Oral)   Resp 16   Ht 5\' 4"  (1.626 m)   Wt 154 lb 9.6 oz (70.1 kg)   SpO2 99%   BMI 26.54 kg/m    Subjective:    Patient ID: , female    DOB: 05/27/1991, 29 y.o.   MRN: [id number]  HPI: Alyssa Kemp is a 29 y.o. female  Chief Complaint  Patient presents with  . Mouth Lesions   MOUTH LESION Reports her dentist saw areas and feels it is where here teeth land on the sides of her mouth.  Has two issues parallel to each other in side of mouth.  She is unsure if she is chewing on side of mouth, wonders if she needs to go back on medicine -- Lexapro and Seroquel, she does not want to go back on these due to weight gain and side effects with it.  Had been on acid reflux medicine as well, but this caused diarrhea -- now uses Pepcid OTC.  Was told she may have IBS -- takes probiotic gummies, but no other medications for this -- focused on diet changes.  Non smoker.  Tried mouth guard in past, but could not get used to this.  Duration: months Location:  Painful: no Itching: no Onset: gradual Context: not changing Associated signs and symptoms: none History of skin cancer: no History of precancerous skin lesions: no Family history of skin cancer: no  Relevant past medical, surgical, family and social history reviewed and updated as indicated. Interim medical history since our last visit reviewed. Allergies and medications reviewed and updated.  Review of Systems  Constitutional: Negative for activity change, appetite change, diaphoresis, fatigue and fever.  Respiratory: Negative for cough, chest tightness and shortness of breath.   Cardiovascular: Negative for chest pain, palpitations and leg swelling.  Gastrointestinal: Negative.   Neurological: Negative.   Psychiatric/Behavioral: Negative.     Per HPI unless specifically indicated above       Objective:    BP 113/75 (BP Location: Left Arm, Patient Position: Sitting, Cuff Size: Normal)   Pulse 76   Temp 98.9 F (37.2 C) (Oral)   Resp 16   Ht 5\' 4"  (1.626 m)   Wt 154 lb 9.6 oz (70.1 kg)   SpO2 99%   BMI 26.54 kg/m   Wt Readings from Last 3 Encounters:  04/17/20 154 lb 9.6 oz (70.1 kg)  03/08/20 150 lb (68 kg)  12/29/19 152 lb (68.9 kg)    Physical Exam Vitals and nursing note reviewed.  Constitutional:      General: She is awake. She is not in acute distress.    Appearance: She is well-developed and well-groomed. She is not ill-appearing.  HENT:     Head: Normocephalic.     Right Ear: Hearing normal.     Left Ear: Hearing normal.     Nose: Nose normal.     Mouth/Throat:     Mouth: Mucous membranes are moist. No injury.     Pharynx: Oropharynx is clear. Uvula midline.     Comments: To areas, parallel to each other, on both sides of mouth approx 1/2 cm in size, round, very mild erythema.  No tenderness or drainage.  Appear to aline with where teeth land when closes mouth.  No lesions noted. Eyes:     General: Lids are normal.  Right eye: No discharge.        Left eye: No discharge.     Conjunctiva/sclera: Conjunctivae normal.     Pupils: Pupils are equal, round, and reactive to light.  Neck:     Thyroid: No thyromegaly.     Vascular: No carotid bruit or JVD.  Cardiovascular:     Rate and Rhythm: Normal rate and regular rhythm.     Heart sounds: Normal heart sounds. No murmur heard.  No gallop.   Pulmonary:     Effort: Pulmonary effort is normal.     Breath sounds: Normal breath sounds.  Abdominal:     General: Bowel sounds are normal.     Palpations: Abdomen is soft. There is no hepatomegaly or splenomegaly.  Musculoskeletal:     Cervical back: Normal range of motion and neck supple.     Right lower leg: No edema.     Left lower leg: No edema.  Lymphadenopathy:     Cervical: No cervical adenopathy.  Skin:    General: Skin is warm and dry.   Neurological:     Mental Status: She is alert and oriented to person, place, and time.  Psychiatric:        Attention and Perception: Attention normal.        Mood and Affect: Mood normal.        Speech: Speech normal.        Behavior: Behavior normal. Behavior is cooperative.        Thought Content: Thought content normal.     Results for orders placed or performed in visit on 03/08/20  Novel Coronavirus, NAA ([facility])   Specimen: Nasopharyngeal(NP) swabs in vial transport medium   Nasopharynge  Screenin  Result Value Ref Range   SARS-CoV-2, NAA Not Detected Not Detected      Assessment & Plan:   Problem List Items Addressed This Visit      Other   Sore in mouth - Primary    Suspect this is irritation from where teeth land bilaterally in posterior mouth, appears to hit side of mouth when closes.  Recommend she continue to follow with dental -- would benefit from braces for alignment. She does not wish to start medication for mood at this time.            Follow up plan: Return if symptoms worsen or fail to improve.

## 2020-04-18 DIAGNOSIS — F411 Generalized anxiety disorder: Secondary | ICD-10-CM | POA: Diagnosis not present

## 2020-04-25 DIAGNOSIS — F411 Generalized anxiety disorder: Secondary | ICD-10-CM | POA: Diagnosis not present

## 2020-04-30 IMAGING — DX CHEST - 2 VIEW
2 series · 2 of 2 positions shown · non-contrast
Comparison: February 22, 2018

CLINICAL DATA: Cough for 6 weeks

EXAM:
CHEST - 2 VIEW

[chest pa]
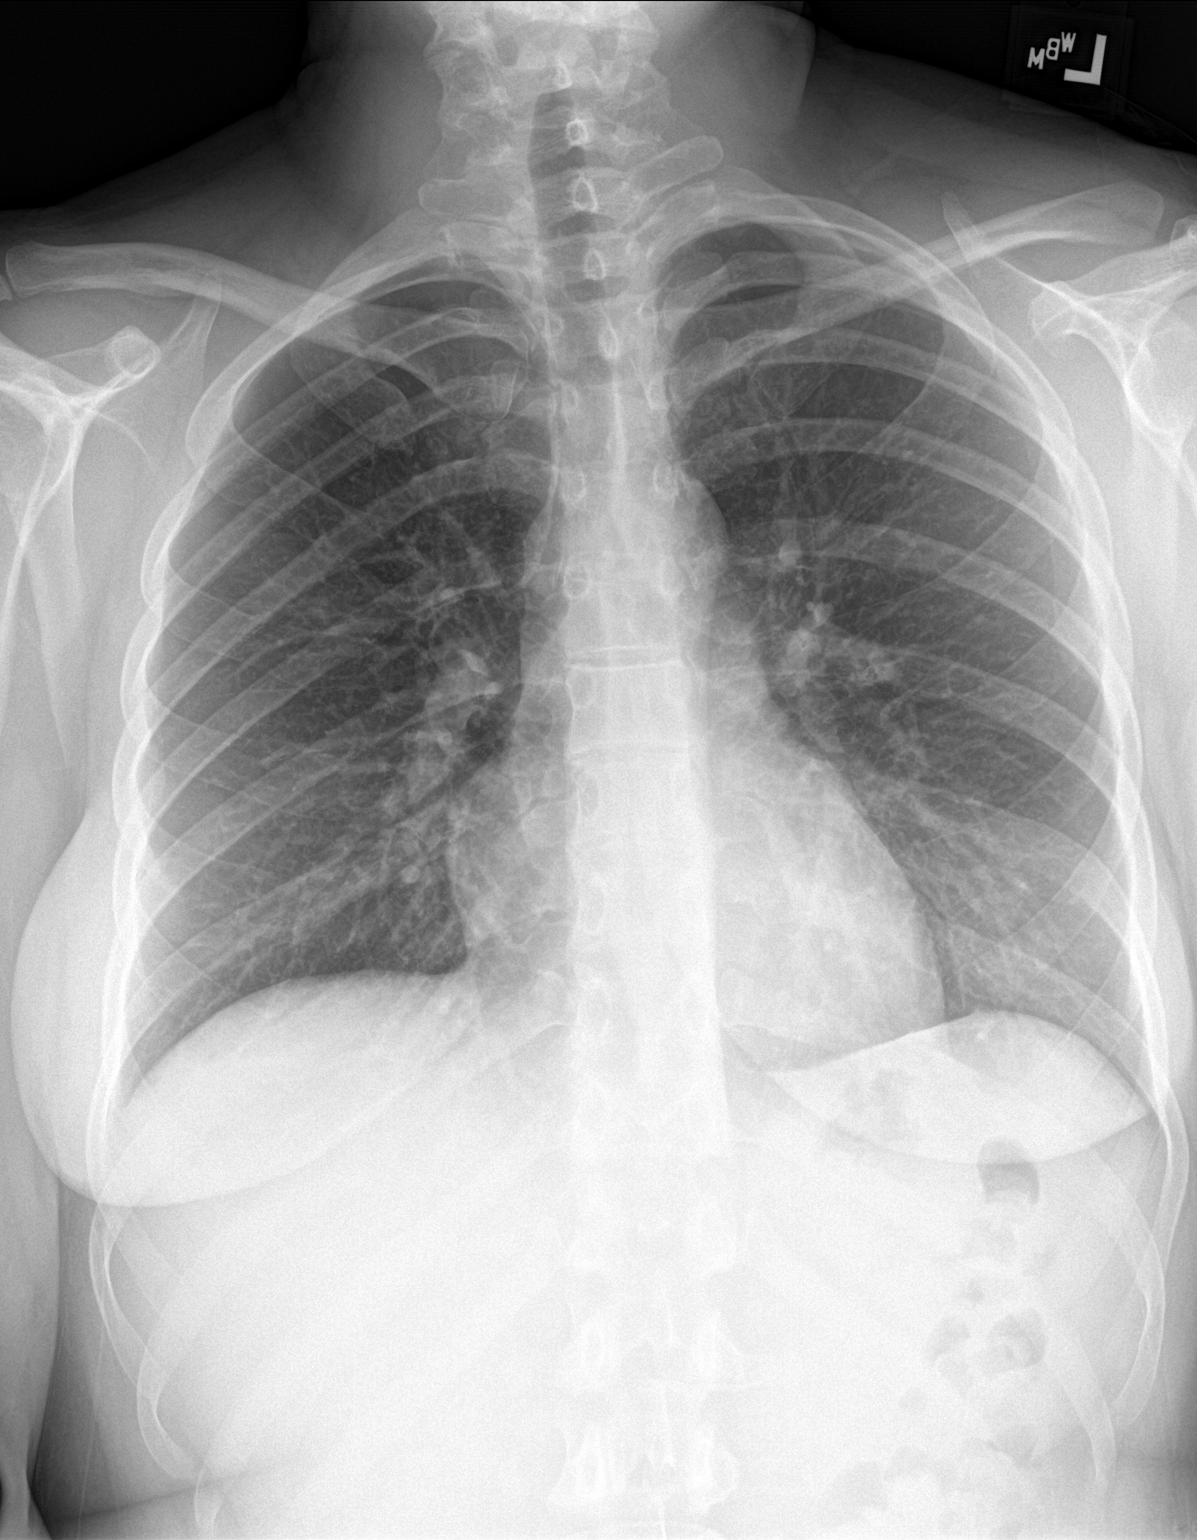

[chest lat]
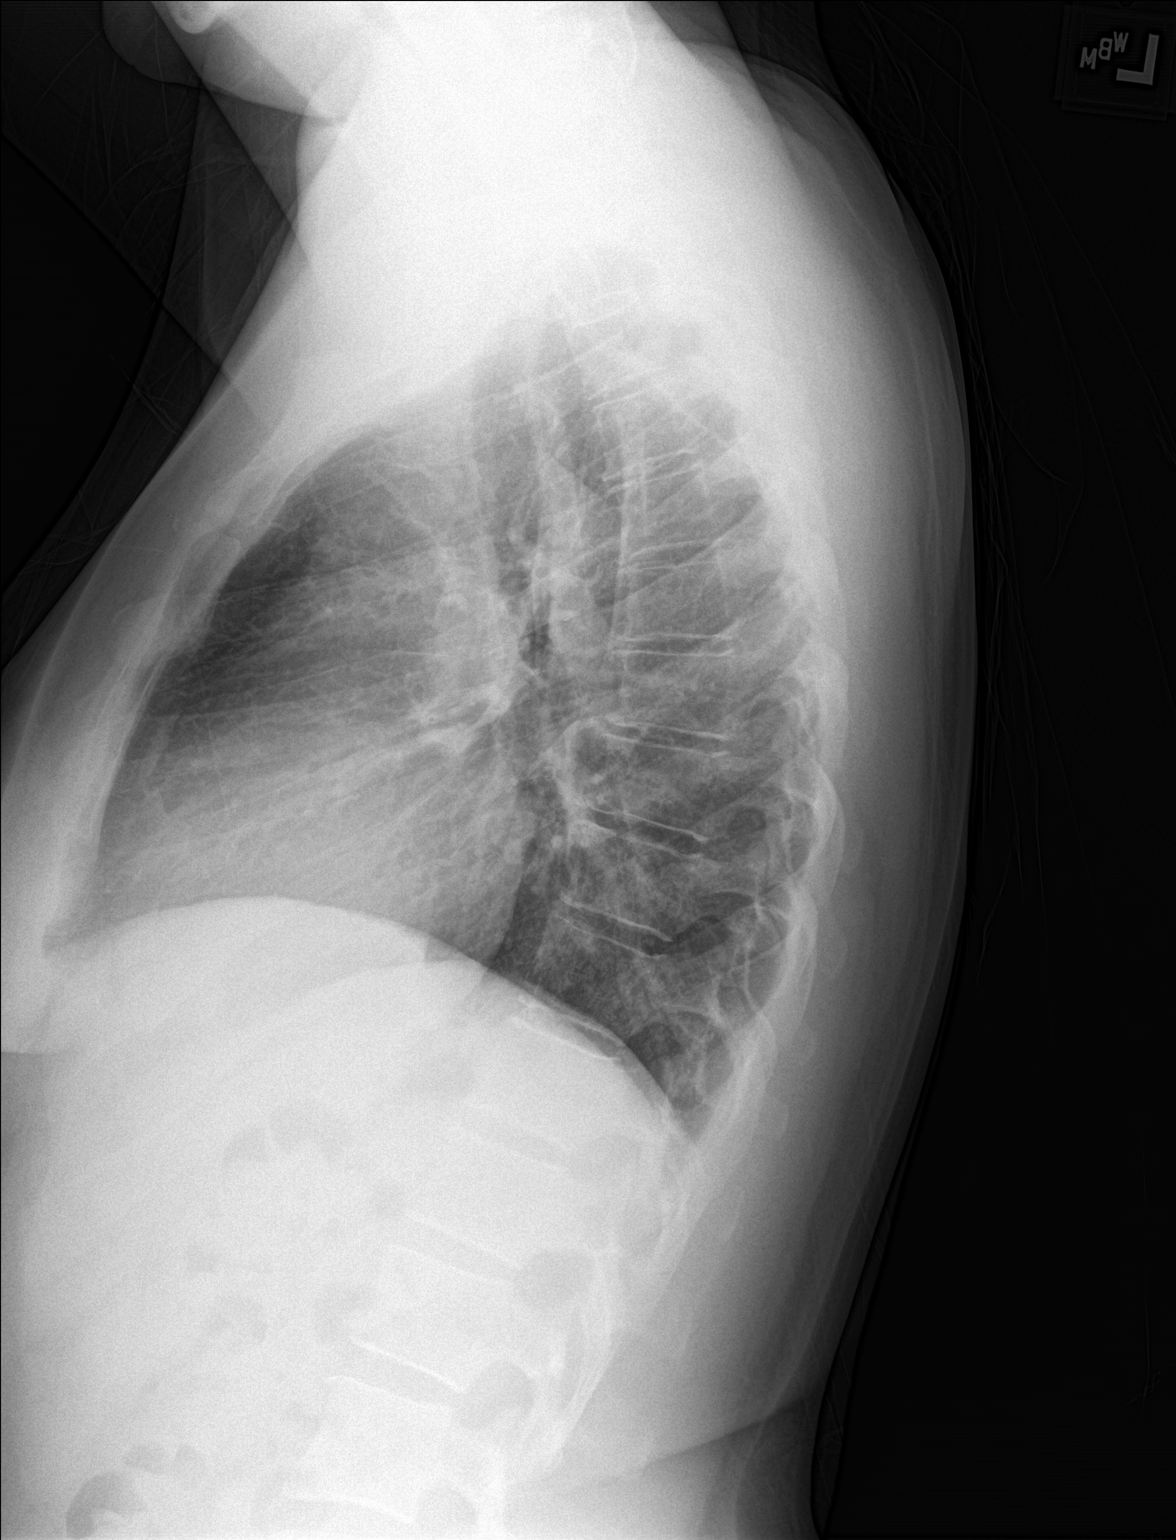

[2 of 2 positions shown; findings below may reference images not displayed]

FINDINGS: The lungs are clear. Heart size and pulmonary vascularity are
normal. No adenopathy. No bone lesions.
IMPRESSION: No edema or consolidation.

## 2020-05-09 DIAGNOSIS — F411 Generalized anxiety disorder: Secondary | ICD-10-CM | POA: Diagnosis not present

## 2020-05-23 DIAGNOSIS — F411 Generalized anxiety disorder: Secondary | ICD-10-CM | POA: Diagnosis not present

## 2020-05-30 DIAGNOSIS — F411 Generalized anxiety disorder: Secondary | ICD-10-CM | POA: Diagnosis not present

## 2020-06-03 ENCOUNTER — Ambulatory Visit (INDEPENDENT_AMBULATORY_CARE_PROVIDER_SITE_OTHER): Payer: Medicaid Other | Admitting: Unknown Physician Specialty

## 2020-06-03 ENCOUNTER — Encounter: Payer: Self-pay | Admitting: Unknown Physician Specialty

## 2020-06-03 DIAGNOSIS — J019 Acute sinusitis, unspecified: Secondary | ICD-10-CM | POA: Diagnosis not present

## 2020-06-03 MED ORDER — AZITHROMYCIN 250 MG PO TABS: ORAL_TABLET | ORAL | 0 refills | Status: DC

## 2020-06-03 NOTE — Progress Notes (Signed)
There were no vitals taken for this visit.   Subjective:    [name], female    DOB: April 12, 1991, 29 y.o.   MRN: [id number]  HPI: Alyssa Kemp is a 29 y.o. female  Chief Complaint  Patient presents with  . Sinusitis    Pt states for 2 weeks she has ad facial sinus pressure and nasal congestion. States she had body aches and low grade fever over the weekend     This visit was completed via telephone due to the restrictions of the COVID-19 pandemic. All issues as above were discussed and addressed but no physical exam was performed. If it was felt that the patient should be evaluated in the office, they were directed there. The patient verbally consented to this visit. Patient was unable to complete an audio/visual visit due to Technical difficulties,Lack of internet. . Location of the patient: home . Location of the provider: work . Those involved with this call:  . Provider: Gabriel Cirri, DNP . CMA: Tiffany Reel, CMA . Front Desk/Registration: Harriet Pho  . Time spent on call: 10 minutes on the phone discussing health concerns. 10 minutes total spent in review of patient's record and preparation of their chart.  I verified patient identity using two factors (patient name and date of birth). Patient consents verbally to being seen via telemedicine visit today.   Sinusitis This is a new problem. Episode onset: 2 weeks. The problem has been gradually worsening since onset. The maximum temperature recorded prior to her arrival was 100.4 - 100.9 F. Associated symptoms include chills, congestion and sinus pressure. Pertinent negatives include no coughing, diaphoresis, ear pain, headaches, hoarse voice, neck pain, shortness of breath, sneezing, sore throat or swollen glands. Past treatments include oral decongestants. The treatment provided mild relief.   Has not gotten a Covid test but son's was negative  Relevant past medical, surgical, family and social  history reviewed and updated as indicated. Interim medical history since our last visit reviewed. Allergies and medications reviewed and updated.  Review of Systems  Constitutional: Positive for chills. Negative for diaphoresis.  HENT: Positive for congestion and sinus pressure. Negative for ear pain, hoarse voice, sneezing and sore throat.   Respiratory: Negative for cough and shortness of breath.   Musculoskeletal: Negative for neck pain.  Neurological: Negative for headaches.    Per HPI unless specifically indicated above     Objective:    There were no vitals taken for this visit.  Wt Readings from Last 3 Encounters:  04/17/20 154 lb 9.6 oz (70.1 kg)  03/08/20 150 lb (68 kg)  12/29/19 152 lb (68.9 kg)    Physical Exam Neurological:     Mental Status: She is alert.  Psychiatric:        Mood and Affect: Mood normal.        Thought Content: Thought content normal.     Results for orders placed or performed in visit on 03/08/20  Novel Coronavirus, NAA ([facility])   Specimen: Nasopharyngeal(NP) swabs in vial transport medium   Nasopharynge  Screenin  Result Value Ref Range   SARS-CoV-2, NAA Not Detected Not Detected      Assessment & Plan:   Problem List Items Addressed This Visit   None   Visit Diagnoses    Acute non-recurrent sinusitis, unspecified location    -  Primary   pt with 3 weeks of sinusitis symptoms.  Rx for Z pack as that has worked.  Has multiple  allergies to medicaitons. Nasal irrigations.     Relevant Medications   azithromycin (ZITHROMAX Z-PAK) 250 MG tablet       Follow up plan: Return if symptoms worsen or fail to improve.

## 2020-06-06 DIAGNOSIS — F411 Generalized anxiety disorder: Secondary | ICD-10-CM | POA: Diagnosis not present

## 2020-06-10 ENCOUNTER — Telehealth (INDEPENDENT_AMBULATORY_CARE_PROVIDER_SITE_OTHER): Payer: Medicaid Other | Admitting: Nurse Practitioner

## 2020-06-10 ENCOUNTER — Encounter: Payer: Self-pay | Admitting: Nurse Practitioner

## 2020-06-10 VITALS — Temp 99.0°F

## 2020-06-10 DIAGNOSIS — R0981 Nasal congestion: Secondary | ICD-10-CM | POA: Insufficient documentation

## 2020-06-10 MED ORDER — NYSTATIN 100000 UNIT/ML MT SUSP: 5.0000 mL | Freq: Four times a day (QID) | OROMUCOSAL | 0 refills | Status: DC

## 2020-06-10 MED ORDER — PREDNISONE 20 MG PO TABS
40.0000 mg | ORAL_TABLET | Freq: Every day | ORAL | 0 refills | Status: AC
Start: 2020-06-10 — End: 2020-06-15

## 2020-06-10 MED ORDER — LIDOCAINE VISCOUS HCL 2 % MT SOLN
15.0000 mL | OROMUCOSAL | 0 refills | Status: DC | PRN
Start: 2020-06-10 — End: 2020-07-02

## 2020-06-10 MED ORDER — HYDROCOD POLST-CPM POLST ER 10-8 MG/5ML PO SUER: 5.0000 mL | Freq: Two times a day (BID) | ORAL | 0 refills | Status: DC | PRN

## 2020-06-10 NOTE — Assessment & Plan Note (Signed)
Ongoing with worsening, no improvement with abx recently.  Will obtain Covid and flu testing, recommend she self quarantine at this time until results return and symptoms improve.  Scripts for Prednisone and Tussionex sent in + Nystatin and viscous Lidocaine for mouth sores.  Recommend she continue OTC medications for symptom relief as needed + focus on increased rest and hydration.  Return to office as needed, will see sooner if testing returns positive.

## 2020-06-10 NOTE — Patient Instructions (Signed)
COVID-19 COVID-19 is a respiratory infection that is caused by a virus called severe acute respiratory syndrome coronavirus 2 (SARS-CoV-2). The disease is also known as coronavirus disease or novel coronavirus. In some people, the virus may not cause any symptoms. In others, it may cause a serious infection. The infection can get worse quickly and can lead to complications, such as:  Pneumonia, or infection of the lungs.  Acute respiratory distress syndrome or ARDS. This is a condition in which fluid build-up in the lungs prevents the lungs from filling with air and passing oxygen into the blood.  Acute respiratory failure. This is a condition in which there is not enough oxygen passing from the lungs to the body or when carbon dioxide is not passing from the lungs out of the body.  Sepsis or septic shock. This is a serious bodily reaction to an infection.  Blood clotting problems.  Secondary infections due to bacteria or fungus.  Organ failure. This is when your body's organs stop working. The virus that causes COVID-19 is contagious. This means that it can spread from person to person through droplets from coughs and sneezes (respiratory secretions). What are the causes? This illness is caused by a virus. You may catch the virus by:  Breathing in droplets from an infected person. Droplets can be spread by a person breathing, speaking, singing, coughing, or sneezing.  Touching something, like a table or a doorknob, that was exposed to the virus (contaminated) and then touching your mouth, nose, or eyes. What increases the risk? Risk for infection You are more likely to be infected with this virus if you:  Are within 6 feet (2 meters) of a person with COVID-19.  Provide care for or live with a person who is infected with COVID-19.  Spend time in crowded indoor spaces or live in shared housing. Risk for serious illness You are more likely to become seriously ill from the virus if you:   Are 50 years of age or older. The higher your age, the more you are at risk for serious illness.  Live in a nursing home or long-term care facility.  Have cancer.  Have a long-term (chronic) disease such as: ? Chronic lung disease, including chronic obstructive pulmonary disease or asthma. ? A long-term disease that lowers your body's ability to fight infection (immunocompromised). ? Heart disease, including heart failure, a condition in which the arteries that lead to the heart become narrow or blocked (coronary artery disease), a disease which makes the heart muscle thick, weak, or stiff (cardiomyopathy). ? Diabetes. ? Chronic kidney disease. ? Sickle cell disease, a condition in which red blood cells have an abnormal "sickle" shape. ? Liver disease.  Are obese. What are the signs or symptoms? Symptoms of this condition can range from mild to severe. Symptoms may appear any time from 2 to 14 days after being exposed to the virus. They include:  A fever or chills.  A cough.  Difficulty breathing.  Headaches, body aches, or muscle aches.  Runny or stuffy (congested) nose.  A sore throat.  New loss of taste or smell. Some people may also have stomach problems, such as nausea, vomiting, or diarrhea. Other people may not have any symptoms of COVID-19. How is this diagnosed? This condition may be diagnosed based on:  Your signs and symptoms, especially if: ? You live in an area with a COVID-19 outbreak. ? You recently traveled to or from an area where the virus is common. ? You   provide care for or live with a person who was diagnosed with COVID-19. ? You were exposed to a person who was diagnosed with COVID-19.  A physical exam.  Lab tests, which may include: ? Taking a sample of fluid from the back of your nose and throat (nasopharyngeal fluid), your nose, or your throat using a swab. ? A sample of mucus from your lungs (sputum). ? Blood tests.  Imaging tests, which  may include, X-rays, CT scan, or ultrasound. How is this treated? At present, there is no medicine to treat COVID-19. Medicines that treat other diseases are being used on a trial basis to see if they are effective against COVID-19. Your health care provider will talk with you about ways to treat your symptoms. For most people, the infection is mild and can be managed at home with rest, fluids, and over-the-counter medicines. Treatment for a serious infection usually takes places in a hospital intensive care unit (ICU). It may include one or more of the following treatments. These treatments are given until your symptoms improve.  Receiving fluids and medicines through an IV.  Supplemental oxygen. Extra oxygen is given through a tube in the nose, a face mask, or a hood.  Positioning you to lie on your stomach (prone position). This makes it easier for oxygen to get into the lungs.  Continuous positive airway pressure (CPAP) or bi-level positive airway pressure (BPAP) machine. This treatment uses mild air pressure to keep the airways open. A tube that is connected to a motor delivers oxygen to the body.  Ventilator. This treatment moves air into and out of the lungs by using a tube that is placed in your windpipe.  Tracheostomy. This is a procedure to create a hole in the neck so that a breathing tube can be inserted.  Extracorporeal membrane oxygenation (ECMO). This procedure gives the lungs a chance to recover by taking over the functions of the heart and lungs. It supplies oxygen to the body and removes carbon dioxide. Follow these instructions at home: Lifestyle  If you are sick, stay home except to get medical care. Your health care provider will tell you how long to stay home. Call your health care provider before you go for medical care.  Rest at home as told by your health care provider.  Do not use any products that contain nicotine or tobacco, such as cigarettes, e-cigarettes, and  chewing tobacco. If you need help quitting, ask your health care provider.  Return to your normal activities as told by your health care provider. Ask your health care provider what activities are safe for you. General instructions  Take over-the-counter and prescription medicines only as told by your health care provider.  Drink enough fluid to keep your urine pale yellow.  Keep all follow-up visits as told by your health care provider. This is important. How is this prevented?  There is no vaccine to help prevent COVID-19 infection. However, there are steps you can take to protect yourself and others from this virus. To protect yourself:   Do not travel to areas where COVID-19 is a risk. The areas where COVID-19 is reported change often. To identify high-risk areas and travel restrictions, check the CDC travel website: wwwnc.cdc.gov/travel/notices  If you live in, or must travel to, an area where COVID-19 is a risk, take precautions to avoid infection. ? Stay away from people who are sick. ? Wash your hands often with soap and water for 20 seconds. If soap and water   are not available, use an alcohol-based hand sanitizer. ? Avoid touching your mouth, face, eyes, or nose. ? Avoid going out in public, follow guidance from your state and local health authorities. ? If you must go out in public, wear a cloth face covering or face mask. Make sure your mask covers your nose and mouth. ? Avoid crowded indoor spaces. Stay at least 6 feet (2 meters) away from others. ? Disinfect objects and surfaces that are frequently touched every day. This may include:  Counters and tables.  Doorknobs and light switches.  Sinks and faucets.  Electronics, such as phones, remote controls, keyboards, computers, and tablets. To protect others: If you have symptoms of COVID-19, take steps to prevent the virus from spreading to others.  If you think you have a COVID-19 infection, contact your health care  provider right away. Tell your health care team that you think you may have a COVID-19 infection.  Stay home. Leave your house only to seek medical care. Do not use public transport.  Do not travel while you are sick.  Wash your hands often with soap and water for 20 seconds. If soap and water are not available, use alcohol-based hand sanitizer.  Stay away from other members of your household. Let healthy household members care for children and pets, if possible. If you have to care for children or pets, wash your hands often and wear a mask. If possible, stay in your own room, separate from others. Use a different bathroom.  Make sure that all people in your household wash their hands well and often.  Cough or sneeze into a tissue or your sleeve or elbow. Do not cough or sneeze into your hand or into the air.  Wear a cloth face covering or face mask. Make sure your mask covers your nose and mouth. Where to find more information  Centers for Disease Control and Prevention: www.cdc.gov/coronavirus/2019-ncov/index.html  World Health Organization: www.who.int/health-topics/coronavirus Contact a health care provider if:  You live in or have traveled to an area where COVID-19 is a risk and you have symptoms of the infection.  You have had contact with someone who has COVID-19 and you have symptoms of the infection. Get help right away if:  You have trouble breathing.  You have pain or pressure in your chest.  You have confusion.  You have bluish lips and fingernails.  You have difficulty waking from sleep.  You have symptoms that get worse. These symptoms may represent a serious problem that is an emergency. Do not wait to see if the symptoms will go away. Get medical help right away. Call your local emergency services (911 in the [place] Do not drive yourself to the hospital. Let the emergency medical personnel know if you think you have COVID-19. Summary  COVID-19 is a  respiratory infection that is caused by a virus. It is also known as coronavirus disease or novel coronavirus. It can cause serious infections, such as pneumonia, acute respiratory distress syndrome, acute respiratory failure, or sepsis.  The virus that causes COVID-19 is contagious. This means that it can spread from person to person through droplets from breathing, speaking, singing, coughing, or sneezing.  You are more likely to develop a serious illness if you are 50 years of age or older, have a weak immune system, live in a nursing home, or have chronic disease.  There is no medicine to treat COVID-19. Your health care provider will talk with you about ways to treat your symptoms.    Take steps to protect yourself and others from infection. Wash your hands often and disinfect objects and surfaces that are frequently touched every day. Stay away from people who are sick and wear a mask if you are sick. This information is not intended to replace advice given to you by your health care provider. Make sure you discuss any questions you have with your health care provider. Document Revised: 04/07/2019 Document Reviewed: 07/14/2018 Elsevier Patient Education  2020 [employer] Inc[employer]  

## 2020-06-10 NOTE — Addendum Note (Signed)
Addended by: Aura Dials T on: 06/10/2020 03:33 PM   Modules accepted: Level of Service

## 2020-06-10 NOTE — Progress Notes (Signed)
Temp 99 F (37.2 C) (Oral)    Subjective:    [name], female    DOB: 15-Feb-1991, 29 y.o.   MRN: [id number]  HPI: Alyssa Kemp is a 29 y.o. female  Chief Complaint  Patient presents with  . Fever    Body aches, cough, fatigue, and mouth ulcers, and Congestion. Talked with C. Jamesetta Orleans was given a Z-pack which help congestion but now has the mouth ulcers. This is all on going since 05/18/20. Patient has been taking Tylenol for fever and ache and pain.     . This visit was completed via telephone due to the restrictions of the COVID-19 pandemic. All issues as above were discussed and addressed but no physical exam was performed. If it was felt that the patient should be evaluated in the office, they were directed there. The patient verbally consented to this visit. Patient was unable to complete an audio/visual visit due to Technical difficulties,Lack of internet. Due to the catastrophic nature of the COVID-19 pandemic, this visit was done through audio contact only. . Location of the patient: home . Location of the provider: work . Those involved with this call:  . Provider: Aura Dials, DNP . CMA: Wilhemena Durie, CMA . Front Desk/Registration: Harriet Pho  . Time spent on call: 20 minutes on the phone discussing health concerns. 15 minutes total spent in review of patient's record and preparation of their chart.  . I verified patient identity using two factors (patient name and date of birth). Patient consents verbally to being seen via telemedicine visit today.    UPPER RESPIRATORY TRACT INFECTION Seen 06/03/20 for sinusitis symptoms and prescribed Zpack, which she reports now has ulcers in mouth back around tonsils -- states she initially felt better, but when abx done felt worse again.  Has body aches, cough, fatigue, and congestion.  Multiple allergies to medications, including Doxycycline, Augmentin, Sulfa.  Last Covid testing on 03/08/20 was negative.   She is not Covid vaccinated.  No recent Covid or flu testing.  Denies loss of taste or smell.   Fever: yes Cough: yes Shortness of breath: no Wheezing: no Chest pain: no Chest tightness: no Chest congestion: no Nasal congestion: yes Runny nose: yes Post nasal drip: yes Sneezing: no Sore throat: yes Swollen glands: no Sinus pressure: yes Headache: yes Face pain: no Toothache: no Ear pain: none Ear pressure: yes bilateral Eyes red/itching:no Eye drainage/crusting: no  Vomiting: no Rash: no Fatigue: yes Sick contacts: yes Strep contacts: no  Context: fluctuating Recurrent sinusitis: no Relief with OTC cold/cough medications: no  Treatments attempted: cold/sinus and antibiotics   Relevant past medical, surgical, family and social history reviewed and updated as indicated. Interim medical history since our last visit reviewed. Allergies and medications reviewed and updated.  Review of Systems  Constitutional: Positive for chills, fatigue and fever. Negative for activity change, appetite change and diaphoresis.  HENT: Positive for congestion, postnasal drip, rhinorrhea and sore throat. Negative for ear discharge, ear pain, facial swelling, sinus pressure, sinus pain, sneezing and voice change.   Eyes: Negative for pain and visual disturbance.  Respiratory: Positive for cough. Negative for chest tightness, shortness of breath and wheezing.   Cardiovascular: Negative for chest pain, palpitations and leg swelling.  Gastrointestinal: Negative for abdominal distention, abdominal pain, constipation, diarrhea, nausea and vomiting.  Endocrine: Negative.   Musculoskeletal: Positive for myalgias.  Neurological: Negative for dizziness, numbness and headaches.  Psychiatric/Behavioral: Negative.     Per HPI unless  specifically indicated above     Objective:    Temp 99 F (37.2 C) (Oral)   Wt Readings from Last 3 Encounters:  04/17/20 154 lb 9.6 oz (70.1 kg)  03/08/20 150 lb (68  kg)  12/29/19 152 lb (68.9 kg)    Physical Exam   Unable to perform due to telephone visit only.  Results for orders placed or performed in visit on 03/08/20  Novel Coronavirus, NAA ([facility])   Specimen: Nasopharyngeal(NP) swabs in vial transport medium   Nasopharynge  Screenin  Result Value Ref Range   SARS-CoV-2, NAA Not Detected Not Detected      Assessment & Plan:   Problem List Items Addressed This Visit      Other   Nasal congestion - Primary    Ongoing with worsening, no improvement with abx recently.  Will obtain Covid and flu testing, recommend she self quarantine at this time until results return and symptoms improve.  Scripts for Prednisone and Tussionex sent in + Nystatin and viscous Lidocaine for mouth sores.  Recommend she continue OTC medications for symptom relief as needed + focus on increased rest and hydration.  Return to office as needed, will see sooner if testing returns positive.        Relevant Orders   Novel Coronavirus, NAA ([facility])   Veritor Flu A/B Waived      I discussed the assessment and treatment plan with the patient. The patient was provided an opportunity to ask questions and all were answered. The patient agreed with the plan and demonstrated an understanding of the instructions.   The patient was advised to call back or seek an in-person evaluation if the symptoms worsen or if the condition fails to improve as anticipated.   I provided 21+ minutes of time during this encounter.  Follow up plan: Return if symptoms worsen or fail to improve.

## 2020-06-10 NOTE — Addendum Note (Signed)
Addended by: Enedina Finner on: 06/10/2020 04:35 PM   Modules accepted: Orders

## 2020-06-12 DIAGNOSIS — R0981 Nasal congestion: Secondary | ICD-10-CM | POA: Diagnosis not present

## 2020-06-12 LAB — SARS-COV-2, NAA 2 DAY TAT

## 2020-06-12 LAB — NOVEL CORONAVIRUS, NAA: SARS-CoV-2, NAA: NOT DETECTED

## 2020-06-12 NOTE — Addendum Note (Signed)
Addended by: Enedina Finner on: 06/12/2020 03:07 PM   Modules accepted: Orders

## 2020-06-12 NOTE — Progress Notes (Signed)
Contacted via MyChart   Good morning Alyssa Kemp, your Covid test returned negative!!  [employer].  If ongoing symptoms please let us know and return to office for visit.

## 2020-06-13 LAB — VERITOR FLU A/B WAIVED
Influenza A: NEGATIVE
Influenza B: NEGATIVE

## 2020-06-13 NOTE — Progress Notes (Signed)
Contacted via MyChart   Flu testing was negative:)

## 2020-06-28 ENCOUNTER — Encounter: Payer: Self-pay | Admitting: Family Medicine

## 2020-06-28 ENCOUNTER — Ambulatory Visit (INDEPENDENT_AMBULATORY_CARE_PROVIDER_SITE_OTHER): Payer: Medicaid Other | Admitting: Family Medicine

## 2020-06-28 ENCOUNTER — Telehealth: Payer: Self-pay | Admitting: Family Medicine

## 2020-06-28 ENCOUNTER — Other Ambulatory Visit: Payer: Self-pay

## 2020-06-28 VITALS — BP 114/75 | HR 80 | Temp 98.6°F | Wt 164.0 lb

## 2020-06-28 DIAGNOSIS — R300 Dysuria: Secondary | ICD-10-CM | POA: Diagnosis not present

## 2020-06-28 LAB — URINALYSIS, ROUTINE W REFLEX MICROSCOPIC

## 2020-06-28 LAB — MICROSCOPIC EXAMINATION: Bacteria, UA: NONE SEEN

## 2020-06-28 MED ORDER — PHENAZOPYRIDINE HCL 95 MG PO TABS: 95.0000 mg | ORAL_TABLET | Freq: Three times a day (TID) | ORAL | 0 refills | Status: DC | PRN

## 2020-06-28 MED ORDER — NITROFURANTOIN MONOHYD MACRO 100 MG PO CAPS: 100.0000 mg | ORAL_CAPSULE | Freq: Two times a day (BID) | ORAL | 0 refills | Status: DC

## 2020-06-28 MED ORDER — CIPROFLOXACIN HCL 500 MG PO TABS: 500.0000 mg | ORAL_TABLET | Freq: Two times a day (BID) | ORAL | 0 refills | Status: DC

## 2020-06-28 NOTE — Telephone Encounter (Signed)
Called patient no answer left message on voicemail regarding Dr Ian Malkin recent message.

## 2020-06-28 NOTE — Progress Notes (Signed)
BP 114/75   Pulse 80   Temp 98.6 F (37 C)   Wt 164 lb (74.4 kg)   SpO2 98%   BMI 28.15 kg/m    Subjective:    [name], female    DOB: [birth year], 30 y.o.   MRN: [id number]  HPI: Alyssa Kemp is a 30 y.o. female  Chief Complaint  Patient presents with  . Urinary Tract Infection    Pt states she is having burning and frequent urination. Has tried otc azio but doesn't seem to help    URINARY SYMPTOMS Duration: yesterday Dysuria: no Urinary frequency: yes Urgency: yes Small volume voids: yes Symptom severity: severe Urinary incontinence: no Foul odor: no Hematuria: no Abdominal pain: no Back pain: yes Suprapubic pain/pressure: no Flank pain: no Fever:  no Vomiting: no Relief with cranberry juice: no Relief with pyridium: no Status: worse Previous urinary tract infection: yes Recurrent urinary tract infection: no History of sexually transmitted disease: no Vaginal discharge: no Treatments attempted: antibiotics, pyridium and increasing fluids   Relevant past medical, surgical, family and social history reviewed and updated as indicated. Interim medical history since our last visit reviewed. Allergies and medications reviewed and updated.  Review of Systems  Constitutional: Negative.   HENT: Negative.   Respiratory: Negative.   Cardiovascular: Negative.   Gastrointestinal: Negative.   Genitourinary: Positive for dysuria, frequency, hematuria and urgency. Negative for decreased urine volume, difficulty urinating, dyspareunia, enuresis, flank pain, genital sores, menstrual problem, pelvic pain, vaginal bleeding, vaginal discharge and vaginal pain.  Musculoskeletal: Negative.   Psychiatric/Behavioral: Negative.     Per HPI unless specifically indicated above     Objective:    BP 114/75   Pulse 80   Temp 98.6 F (37 C)   Wt 164 lb (74.4 kg)   SpO2 98%   BMI 28.15 kg/m   Wt Readings from Last 3 Encounters:  06/28/20 164 lb  (74.4 kg)  04/17/20 154 lb 9.6 oz (70.1 kg)  03/08/20 150 lb (68 kg)    Physical Exam Vitals and nursing note reviewed.  Constitutional:      General: She is not in acute distress.    Appearance: Normal appearance. She is not ill-appearing, toxic-appearing or diaphoretic.  HENT:     Head: Normocephalic and atraumatic.     Right Ear: External ear normal.     Left Ear: External ear normal.     Nose: Nose normal.     Mouth/Throat:     Mouth: Mucous membranes are moist.     Pharynx: Oropharynx is clear.  Eyes:     General: No scleral icterus.       Right eye: No discharge.        Left eye: No discharge.     Extraocular Movements: Extraocular movements intact.     Conjunctiva/sclera: Conjunctivae normal.     Pupils: Pupils are equal, round, and reactive to light.  Cardiovascular:     Rate and Rhythm: Normal rate and regular rhythm.     Pulses: Normal pulses.     Heart sounds: Normal heart sounds. No murmur heard. No friction rub. No gallop.   Pulmonary:     Effort: Pulmonary effort is normal. No respiratory distress.     Breath sounds: Normal breath sounds. No stridor. No wheezing, rhonchi or rales.  Chest:     Chest wall: No tenderness.  Musculoskeletal:        General: Normal range of motion.  Cervical back: Normal range of motion and neck supple.  Skin:    General: Skin is warm and dry.     Capillary Refill: Capillary refill takes less than 2 seconds.     Coloration: Skin is not jaundiced or pale.     Findings: No bruising, erythema, lesion or rash.  Neurological:     General: No focal deficit present.     Mental Status: She is alert and oriented to person, place, and time. Mental status is at baseline.  Psychiatric:        Mood and Affect: Mood normal.        Behavior: Behavior normal.        Thought Content: Thought content normal.        Judgment: Judgment normal.     Results for orders placed or performed in visit on 06/28/20  Urine Culture   Specimen:  Urine   UR  Result Value Ref Range   Urine Culture, Routine Preliminary report (A)    Organism ID, Bacteria Gram negative rods (A)   Microscopic Examination   Urine  Result Value Ref Range   WBC, UA 0-5 0 - 5 /hpf   RBC 3-10 (A) 0 - 2 /hpf   Epithelial Cells (non renal) 0-10 0 - 10 /hpf   Bacteria, UA None seen None seen/Few  Urinalysis, Routine w reflex microscopic  Result Value Ref Range   Specific Gravity, UA CANCELED    pH, UA CANCELED    Color, UA Orange Yellow   Appearance Ur Clear Clear   Protein,UA CANCELED    Glucose, UA CANCELED    Ketones, UA CANCELED    Microscopic Examination See below:       Assessment & Plan:   Problem List Items Addressed This Visit   None   Visit Diagnoses    Dysuria    -  Primary   UA clear today, but with significant symptoms. Will treat and await culture. Call with any concerns. Continue to monitor.    Relevant Orders   Urinalysis, Routine w reflex microscopic (Completed)   Urine Culture (Completed)       Follow up plan: Return if symptoms worsen or fail to improve.

## 2020-06-28 NOTE — Telephone Encounter (Signed)
Pt calling stating that the medication, Macrobid, that was prescribed today is causing her feet to itch badly. She states that she is beginning to see the start of hives breaking out on her feet and is requesting to have something new sent in for her. She states that her pharmacy closes at 6, and that they are not open on the weekends. Please advise.      [id number] [place], [place] - [address] AT [facility]  [address] [place] [address]  Phone: [phone] Fax: [phone]  Hours: Not open 24 hours

## 2020-06-28 NOTE — Telephone Encounter (Signed)
Stop macrobid. Start benadryl and use OTC hydrocortisone. If not getting better or getting worse- call. New Abx sent to her pharmacy.

## 2020-07-01 ENCOUNTER — Encounter: Payer: Self-pay | Admitting: Family Medicine

## 2020-07-02 ENCOUNTER — Other Ambulatory Visit: Payer: Self-pay | Admitting: Nurse Practitioner

## 2020-07-02 ENCOUNTER — Telehealth (INDEPENDENT_AMBULATORY_CARE_PROVIDER_SITE_OTHER): Payer: Medicaid Other | Admitting: Family Medicine

## 2020-07-02 ENCOUNTER — Encounter: Payer: Self-pay | Admitting: Family Medicine

## 2020-07-02 ENCOUNTER — Telehealth: Payer: Self-pay

## 2020-07-02 VITALS — Temp 98.6°F

## 2020-07-02 DIAGNOSIS — Z20822 Contact with and (suspected) exposure to covid-19: Secondary | ICD-10-CM

## 2020-07-02 DIAGNOSIS — R059 Cough, unspecified: Secondary | ICD-10-CM

## 2020-07-02 LAB — URINE CULTURE

## 2020-07-02 MED ORDER — PREDNISONE 50 MG PO TABS: 50.0000 mg | ORAL_TABLET | Freq: Every day | ORAL | 0 refills | Status: DC

## 2020-07-02 MED ORDER — HYDROCOD POLST-CPM POLST ER 10-8 MG/5ML PO SUER: 5.0000 mL | Freq: Two times a day (BID) | ORAL | 0 refills | Status: DC | PRN

## 2020-07-02 NOTE — Telephone Encounter (Signed)
Pt called in stating that she is having cough and congestion again. Pt was seen virtually 12/20 for this. She states that she was better after taking antibiotic but then developed uti symptoms when she finished it. She was seen again 1/7 for uti. Pt states that she has just finished the antibiotic and is now coughing and congested again. Pt is wanting to know if she should have another covid test done. Does pt need a full virtual appt or just covid swab. Please advise.

## 2020-07-02 NOTE — Telephone Encounter (Signed)
Virtual visit please, but can obtain Covid swab today.  Will order now.

## 2020-07-02 NOTE — Telephone Encounter (Signed)
Pt called back spoke with Laurelyn Sickle and was scheduled to speak with Dr Laural Benes virtually today

## 2020-07-02 NOTE — Telephone Encounter (Signed)
Called pt to schedule no answer left vm  

## 2020-07-02 NOTE — Patient Instructions (Signed)
To schedule your appointment, please visit [email or web address] or call [phone] (Mon. - Fri. 7 a.m. to 7 p.m)

## 2020-07-02 NOTE — Progress Notes (Signed)
Temp 98.6 F (37 C)    Subjective:    [name], female    DOB: September 22, 1990, 30 y.o.   MRN: [id number]  HPI: Alyssa Kemp is a 30 y.o. female  Chief Complaint  Patient presents with  . URI    Pt states she has started to have a scratchy throat and congestion yesterday. States she has started having body aches and headache today. States she is waiting on her daughters test results for covid right now, she was tested yesterday   UPPER RESPIRATORY TRACT INFECTION Duration: yesterday, was sick about 4 weeks ago, has been on several rounds of antibiotics and is concerned about having something else going on Worst symptom: congestion, sore throat, body aches Fever: no Cough: yes Shortness of breath: no Wheezing: no Chest pain: no Chest tightness: yes Chest congestion: no Nasal congestion: yes Runny nose: no Post nasal drip: yes Sneezing: no Sore throat: yes Swollen glands: no Sinus pressure: no Headache: yes Face pain: no Toothache: no Ear pain: no  Ear pressure: no  Eyes red/itching:no Eye drainage/crusting: no  Vomiting: no Rash: no Fatigue: yes Sick contacts: yes- daughter is sick with the same thing Strep contacts: no  Context: worse Recurrent sinusitis: no Relief with OTC cold/cough medications: no  Treatments attempted: none   Relevant past medical, surgical, family and social history reviewed and updated as indicated. Interim medical history since our last visit reviewed. Allergies and medications reviewed and updated.  Review of Systems  Constitutional: Positive for chills, diaphoresis and fatigue. Negative for activity change, appetite change, fever and unexpected weight change.  HENT: Positive for congestion, postnasal drip, rhinorrhea, sinus pain and sore throat. Negative for dental problem, drooling, ear discharge, ear pain, facial swelling, hearing loss, mouth sores, nosebleeds, sinus pressure, sneezing, tinnitus, trouble swallowing  and voice change.   Respiratory: Positive for cough. Negative for apnea, choking, chest tightness, shortness of breath, wheezing and stridor.   Cardiovascular: Negative.   Gastrointestinal: Negative.   Musculoskeletal: Positive for myalgias. Negative for arthralgias, back pain, gait problem, joint swelling, neck pain and neck stiffness.  Skin: Negative.   Psychiatric/Behavioral: Negative.     Per HPI unless specifically indicated above     Objective:    Temp 98.6 F (37 C)   Wt Readings from Last 3 Encounters:  06/28/20 164 lb (74.4 kg)  04/17/20 154 lb 9.6 oz (70.1 kg)  03/08/20 150 lb (68 kg)    Physical Exam Vitals and nursing note reviewed.  Constitutional:      General: She is not in acute distress.    Appearance: Normal appearance. She is not ill-appearing, toxic-appearing or diaphoretic.  HENT:     Head: Normocephalic and atraumatic.     Right Ear: External ear normal.     Left Ear: External ear normal.     Nose: Nose normal.     Mouth/Throat:     Mouth: Mucous membranes are moist.     Pharynx: Oropharynx is clear.  Eyes:     General: No scleral icterus.       Right eye: No discharge.        Left eye: No discharge.     Conjunctiva/sclera: Conjunctivae normal.     Pupils: Pupils are equal, round, and reactive to light.  Pulmonary:     Effort: Pulmonary effort is normal. No respiratory distress.     Comments: Speaking in full sentences Musculoskeletal:        General: Normal range  of motion.     Cervical back: Normal range of motion.  Skin:    Coloration: Skin is not jaundiced or pale.     Findings: No bruising, erythema, lesion or rash.  Neurological:     Mental Status: She is alert and oriented to person, place, and time. Mental status is at baseline.  Psychiatric:        Mood and Affect: Mood normal.        Behavior: Behavior normal.        Thought Content: Thought content normal.        Judgment: Judgment normal.     Results for orders placed or  performed in visit on 06/28/20  Urine Culture   Specimen: Urine   UR  Result Value Ref Range   Urine Culture, Routine Final report (A)    Organism ID, Bacteria Escherichia coli (A)    Antimicrobial Susceptibility Comment   Microscopic Examination   Urine  Result Value Ref Range   WBC, UA 0-5 0 - 5 /hpf   RBC 3-10 (A) 0 - 2 /hpf   Epithelial Cells (non renal) 0-10 0 - 10 /hpf   Bacteria, UA None seen None seen/Few  Urinalysis, Routine w reflex microscopic  Result Value Ref Range   Specific Gravity, UA CANCELED    pH, UA CANCELED    Color, UA Orange Yellow   Appearance Ur Clear Clear   Protein,UA CANCELED    Glucose, UA CANCELED    Ketones, UA CANCELED    Microscopic Examination See below:       Assessment & Plan:   Problem List Items Addressed This Visit   None   Visit Diagnoses    Suspected COVID-19 virus infection    -  Primary   Will go tonight to get swabbed. Self-quarantine until results are back. Prednisone and tussionex for comfort. Call if not getting better or getting worse.   Relevant Orders   Novel Coronavirus, NAA ([facility])       Follow up plan: Return if symptoms worsen or fail to improve.   . This visit was completed via MyChart due to the restrictions of the COVID-19 pandemic. All issues as above were discussed and addressed. Physical exam was done as above through visual confirmation on MyChart. If it was felt that the patient should be evaluated in the office, they were directed there. The patient verbally consented to this visit. . Location of the patient: home . Location of the provider: home . Those involved with this call:  . Provider: Olevia Perches, DO . CMA: Wilhemena Durie, CMA . Front Desk/Registration: Harriet Pho  . Time spent on call: 15 minutes with patient face to face via video conference. More than 50% of this time was spent in counseling and coordination of care. 23 minutes total spent in review of patient's record and preparation  of their chart.

## 2020-07-04 ENCOUNTER — Other Ambulatory Visit: Payer: Medicaid Other

## 2020-07-04 DIAGNOSIS — Z20822 Contact with and (suspected) exposure to covid-19: Secondary | ICD-10-CM | POA: Diagnosis not present

## 2020-07-06 LAB — NOVEL CORONAVIRUS, NAA: SARS-CoV-2, NAA: DETECTED — AB

## 2020-07-06 LAB — SARS-COV-2, NAA 2 DAY TAT

## 2020-07-08 ENCOUNTER — Ambulatory Visit: Payer: Self-pay | Admitting: Nurse Practitioner

## 2020-07-08 ENCOUNTER — Telehealth: Payer: Self-pay | Admitting: Family Medicine

## 2020-07-08 NOTE — Progress Notes (Signed)
Please let Alyssa Kemp know her Covid results returned positive.   I would recommend starting Vitamin C 500 MG twice a day, Quercetin 250-500 MG twice a day, Zinc 75-100 MG daily, and Vitamin D3 2000-4000 units daily to help.  You may also use over the counter cold medications for symptom relief.  Ensure good hydration and rest.  Tylenol for fever if presents.  Stay quarantined for at least 10 days.  If worsening symptoms immediately notify office or head to UC or ER.

## 2020-07-08 NOTE — Telephone Encounter (Signed)
Attempted to call patient with results- left message to call back.

## 2020-07-08 NOTE — Telephone Encounter (Signed)
Called Alyssa Kemp to review test results. Alyssa Kemp has S/S of sore throat, sore neck and congestion. Alyssa Kemp denies cough this time. She did have a cough earlier iin the disease process.and was prescribed chlorpheniramine-HYDROcodone (TUSSIONEX PENNKINETIC ER) 10-8 MG/5ML SUER . Alyssa Kemp took this medicine and had a moderate allergic reaction.  Alyssa Kemp had facial swelling and was advised to take her prescribed prednisone and benadryl . Advised Alyssa Kemp not to take this medicine again. Also discussed allergic reactions can become very serious very quickly and that should facial swelling appear she needs to take benadryl and head to the ED for treatment as this may become life threatening.   Alyssa Kemp has not had vaccines. Reviewed quarantine guidelines for unvaccinated pts. Alyssa Kemp verbalized understanding.    Please let Elner know her Covid results returned positive.  I would recommend starting Vitamin C 500 MG twice a day,Alyssa Kemp  Quercetin 250-500 MG twice a day, Zinc 75-100 MG daily, and Vitamin D3 2000-4000 units daily to help. You may also use over the counter cold medications for symptom relief. Ensure good hydration and rest. Tylenol for fever if presents. Stay quarantined for at least 10 days. If worsening symptoms immediately notify office or head to UC or ER.  Alyssa Kemp states that she will not take Vitamin C as it irritates her IBS.  Reason for Disposition . [1] [id number] diagnosed by positive lab test (e.g., PCR, rapid self-test kit) AND [2] mild symptoms (e.g., cough, fever, others) AND [2] no complications or SOB  Answer Assessment - Initial Assessment Questions 1. COVID-19 DIAGNOSIS: "Who made your COVID-19 diagnosis?" "Was it confirmed by a positive lab test?" If not diagnosed by a HCP, ask "Are there lots of cases (community spread) where you live?" Note: See public health department website, if unsure.     *No Answer* 2. COVID-19 EXPOSURE: "Was there any known exposure to COVID before the symptoms began?" CDC Definition of close  contact: within 6 feet (2 meters) for a total of 15 minutes or more over a 24-hour period.      *No Answer* 3. ONSET: "When did the COVID-19 symptoms start?"      *No Answer* 4. WORST SYMPTOM: "What is your worst symptom?" (e.g., cough, fever, shortness of breath, muscle aches)     *No Answer* 5. COUGH: "Do you have a cough?" If Yes, ask: "How bad is the cough?"       *No Answer* 6. FEVER: "Do you have a fever?" If Yes, ask: "What is your temperature, how was it measured, and when did it start?"     *No Answer* 7. RESPIRATORY STATUS: "Describe your breathing?" (e.g., shortness of breath, wheezing, unable to speak)      *No Answer* 8. BETTER-SAME-WORSE: "Are you getting better, staying the same or getting worse compared to yesterday?"  If getting worse, ask, "In what way?"     *No Answer* 9. HIGH RISK DISEASE: "Do you have any chronic medical problems?" (e.g., asthma, heart or lung disease, weak immune system, obesity, etc.)     *No Answer* 10. VACCINE: "Have you gotten the COVID-19 vaccine?" If Yes ask: "Which one, how many shots, when did you get it?"       *No Answer* 11. PREGNANCY: "Is there any chance you are pregnant?" "When was your last menstrual period?"       *No Answer* 12. OTHER SYMPTOMS: "Do you have any other symptoms?"  (e.g., chills, fatigue, headache, loss of smell or taste, muscle pain, sore throat; new loss of smell or  taste especially support the diagnosis of COVID-19)       *No Answer*  Protocols used: CORONAVIRUS (COVID-19) DIAGNOSED OR SUSPECTED-A-AH

## 2020-07-08 NOTE — Telephone Encounter (Unsigned)
Copied from CRM [id number]. Topic: Quick Communication - Lab Results (Clinic Use ONLY) >> Jul 08, 2020 10:21 AM Pablo Ledger, CMA wrote: Called patient for lab results, LVM for patient to call back. OK for [facility] to disclose results if patient calls back.

## 2020-07-09 ENCOUNTER — Encounter: Payer: Self-pay | Admitting: Family Medicine

## 2020-07-09 ENCOUNTER — Telehealth (INDEPENDENT_AMBULATORY_CARE_PROVIDER_SITE_OTHER): Payer: Medicaid Other | Admitting: Family Medicine

## 2020-07-09 DIAGNOSIS — L509 Urticaria, unspecified: Secondary | ICD-10-CM | POA: Diagnosis not present

## 2020-07-09 DIAGNOSIS — Z1152 Encounter for screening for COVID-19: Secondary | ICD-10-CM

## 2020-07-09 NOTE — Progress Notes (Signed)
There were no vitals taken for this visit.   Subjective:    [name], female    DOB: 19-Jan-1991, 30 y.o.   MRN: [id number]  HPI: Alyssa Kemp is a 30 y.o. female  Chief Complaint  Patient presents with  . Medication Reaction    Patient states she is covid positive and was taking prednisone and tussonex cough syrup. Saturday night she forgot to take her prednisone and only took the cough syrup. Sunday morning she woke up with swollen lips and hives. Patient is wondering if the allergic was coming from the cough syrup.    RASH Duration:  4 days  Location: generalized  Itching: yes Burning: no Redness: yes Oozing: no Scaling: no Blisters: no Painful: no Fevers: no Change in detergents/soaps/personal care products: no Recent illness: yes Recent travel:no History of same: no Context: better Alleviating factors: benadryl, prednisone Treatments attempted:benadryl Shortness of breath: no  Throat/tongue swelling: no Myalgias/arthralgias: yes  Relevant past medical, surgical, family and social history reviewed and updated as indicated. Interim medical history since our last visit reviewed. Allergies and medications reviewed and updated.  Review of Systems  Constitutional: Negative.   HENT: Positive for congestion. Negative for dental problem, drooling, ear discharge, ear pain, facial swelling, hearing loss, mouth sores, nosebleeds, postnasal drip, rhinorrhea, sinus pressure, sinus pain, sneezing, sore throat, tinnitus, trouble swallowing and voice change.   Respiratory: Positive for cough. Negative for apnea, choking, chest tightness, shortness of breath, wheezing and stridor.   Cardiovascular: Negative.   Gastrointestinal: Negative.   Musculoskeletal: Positive for myalgias. Negative for arthralgias, back pain, gait problem, joint swelling and neck pain.  Psychiatric/Behavioral: Negative.     Per HPI unless specifically indicated above      Objective:    There were no vitals taken for this visit.  Wt Readings from Last 3 Encounters:  06/28/20 164 lb (74.4 kg)  04/17/20 154 lb 9.6 oz (70.1 kg)  03/08/20 150 lb (68 kg)    Physical Exam Vitals and nursing note reviewed.  Constitutional:      General: She is not in acute distress.    Appearance: Normal appearance. She is not ill-appearing, toxic-appearing or diaphoretic.  HENT:     Head: Normocephalic and atraumatic.     Right Ear: External ear normal.     Left Ear: External ear normal.     Nose: Nose normal.     Mouth/Throat:     Mouth: Mucous membranes are moist.     Pharynx: Oropharynx is clear.  Eyes:     General: No scleral icterus.       Right eye: No discharge.        Left eye: No discharge.     Conjunctiva/sclera: Conjunctivae normal.     Pupils: Pupils are equal, round, and reactive to light.  Pulmonary:     Effort: Pulmonary effort is normal. No respiratory distress.     Comments: Speaking in full sentences Musculoskeletal:        General: Normal range of motion.     Cervical back: Normal range of motion.  Skin:    Coloration: Skin is not jaundiced or pale.     Findings: No bruising, erythema, lesion or rash.  Neurological:     Mental Status: She is alert and oriented to person, place, and time. Mental status is at baseline.  Psychiatric:        Mood and Affect: Mood normal.        Behavior:  Behavior normal.        Thought Content: Thought content normal.        Judgment: Judgment normal.     Results for orders placed or performed in visit on 07/04/20  Novel Coronavirus, NAA ([facility])   Specimen: Saline  Result Value Ref Range   SARS-CoV-2, NAA Detected (A) Not Detected  SARS-COV-2, NAA 2 DAY TAT  Result Value Ref Range   SARS-CoV-2, NAA 2 DAY TAT Performed       Assessment & Plan:   Problem List Items Addressed This Visit   None   Visit Diagnoses    Hives    -  Primary   Resolved off tussionex. Will list as allergy. Continue to  monitor. If starts with issue again will restart steroids.    Encounter for screening for COVID-19       Concerned about getting the vaccine. Will check antibodies. Await results.    Relevant Orders   SARS-CoV-2 Semi-Quantitative Total Antibody, Spike       Follow up plan: Return if symptoms worsen or fail to improve.   . This visit was completed via MyChart due to the restrictions of the COVID-19 pandemic. All issues as above were discussed and addressed. Physical exam was done as above through visual confirmation on MyChart. If it was felt that the patient should be evaluated in the office, they were directed there. The patient verbally consented to this visit. . Location of the patient: home . Location of the provider: home . Those involved with this call:  . Provider: Olevia Perches, DO . CMA: Rolley Sims, CMA . Front Desk/Registration: Harriet Pho  . Time spent on call: 15 minutes with patient face to face via video conference. More than 50% of this time was spent in counseling and coordination of care. 23 minutes total spent in review of patient's record and preparation of their chart.

## 2020-07-10 DIAGNOSIS — F338 Other recurrent depressive disorders: Secondary | ICD-10-CM | POA: Diagnosis not present

## 2020-07-24 DIAGNOSIS — F338 Other recurrent depressive disorders: Secondary | ICD-10-CM | POA: Diagnosis not present

## 2020-07-26 ENCOUNTER — Encounter: Payer: Self-pay | Admitting: Nurse Practitioner

## 2020-07-26 DIAGNOSIS — Z8616 Personal history of COVID-19: Secondary | ICD-10-CM | POA: Insufficient documentation

## 2020-08-01 ENCOUNTER — Encounter: Payer: Self-pay | Admitting: Nurse Practitioner

## 2020-08-01 DIAGNOSIS — F338 Other recurrent depressive disorders: Secondary | ICD-10-CM | POA: Diagnosis not present

## 2020-08-08 DIAGNOSIS — F338 Other recurrent depressive disorders: Secondary | ICD-10-CM | POA: Diagnosis not present

## 2020-08-15 DIAGNOSIS — F338 Other recurrent depressive disorders: Secondary | ICD-10-CM | POA: Diagnosis not present

## 2020-08-15 NOTE — Progress Notes (Deleted)
There were no vitals taken for this visit.   Subjective:    [name], female    DOB: Sep 03, 1990, 30 y.o.   MRN: [id number]  HPI: Alyssa Kemp is a 30 y.o. female presenting on 08/16/2020 for comprehensive medical examination. Current medical complaints include:{Blank single:19197::"none","***"}  She currently lives with: Menopausal Symptoms: {Blank single:19197::"yes","no"}  GERD  BIPOLAR 1  Depression Screen done today and results listed below:  Depression screen [facility] 2/9 07/31/2019 07/11/2018 03/31/2018 07/31/2016  Decreased Interest 0 0 0 0  Down, Depressed, Hopeless 0 0 0 0  PHQ - 2 Score 0 0 0 0  Altered sleeping 0 0 0 -  Tired, decreased energy 0 0 0 -  Change in appetite 0 0 3 -  Feeling bad or failure about yourself  0 0 1 -  Trouble concentrating 0 3 0 -  Moving slowly or fidgety/restless 0 0 0 -  Suicidal thoughts 0 - 0 -  PHQ-9 Score 0 3 4 -  Difficult doing work/chores - Not difficult at all - -    The patient {has/does not UUVO:53664} a history of falls. I {did/did not:19850} complete a risk assessment for falls. A plan of care for falls {was/was not:19852} documented.   Past Medical History:  Past Medical History:  Diagnosis Date  . Bipolar depression (HCC)   . GERD (gastroesophageal reflux disease)   . Gestational diabetes   . Gestational diabetes mellitus (GDM) affecting pregnancy, antepartum 09/05/2016  . History of macrosomia in infant in prior pregnancy, currently pregnant   . 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 Scoliosis     Surgical History:  Past Surgical History:  Procedure Laterality Date  . ADENOIDECTOMY    . 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 CYSTOSCOPY W/ RETROGRADES Bilateral 12/28/2017   Procedure: CYSTOSCOPY WITH RETROGRADE PYELOGRAM;  Surgeon: [name], MD;  Location: [facility] ORS;  Service: Urology;  Laterality: Bilateral;  . TONSILLECTOMY    . TUBAL LIGATION Bilateral 09/19/2016   Procedure: POST PARTUM  TUBAL LIGATION;  Surgeon: [name], MD;  Location: [facility] ORS;  Service: Gynecology;  Laterality: Bilateral;  . WISDOM TOOTH EXTRACTION      Medications:  Current Outpatient Medications on File Prior to Visit  Medication Sig  . ALFALFA PO Take by mouth.  . EPINEPHrine (EPIPEN 2-PAK) 0.3 mg/0.3 mL IJ SOAJ injection Inject 0.3 mLs (0.3 mg total) into the muscle as needed for anaphylaxis.  . Multiple Vitamin (MULTIVITAMINS PO) Take by mouth daily.  Marland Kitchen VYVANSE 20 MG capsule Take 20 mg by mouth every morning.   No current facility-administered medications on file prior to visit.    Allergies:  Allergies  Allergen Reactions  . Sulfacetamide Sodium Shortness Of Breath  . Sulfasalazine Shortness Of Breath  . Tussionex Pennkinetic Er [Hydrocod Polst-Cpm Polst Er] Hives and Swelling  . Doxycycline Swelling  . Macrobid [Nitrofurantoin] Hives  . Amoxicillin-Pot Clavulanate Other (See Comments)    Unknown- from childhood    Social History:  Social History   Socioeconomic History  . Marital status: Single    Spouse name: Not on file  . Number of children: Not on file  . Years of education: Not on file  . Highest education level: Not on file  Occupational History  . Not on file  Tobacco Use  . Smoking status: Never Smoker  . Smokeless tobacco: Never Used  Vaping Use  . Vaping Use: Never used  Substance and Sexual Activity  . Alcohol use: No  . Drug use: No  . Sexual activity: Yes    Birth control/protection: None, Surgical  Other Topics Concern  . Not on file  Social History Narrative  . Not on file   Social Determinants of Health   Financial Resource Strain: Not on file  Food Insecurity: Not on file  Transportation Needs: Not on file  Physical Activity: Not on file  Stress: Not on file  Social Connections: Not on file  Intimate Partner Violence: Not on file   Social History   Tobacco Use  Smoking Status Never Smoker  Smokeless Tobacco Never Used   Social  History   Substance and Sexual Activity  Alcohol Use No    Family History:  Family History  Problem Relation Age of Onset  . Cervical cancer Mother   . Ovarian cancer Mother   . Hypertension Maternal Grandmother   . Pancreatic cancer Maternal Grandmother   . Diabetes Maternal Grandmother   . Coronary artery disease Maternal Grandfather   . Congestive Heart Failure Maternal Grandfather   . Hypertension Maternal Grandfather   . Prostate cancer Maternal Grandfather   . Diabetes Maternal Grandfather   . Hypertension Paternal Grandmother   . Diabetes Paternal Grandmother   . Heart disease Paternal Grandfather   . Hypertension Paternal Grandfather   . Breast cancer Paternal Aunt     Past medical history, surgical history, medications, allergies, family history and social history reviewed with patient today and changes made to appropriate areas of the chart.   ROS All other ROS negative except what is listed above and in the HPI.      Objective:    There were no vitals taken for this visit.  Wt Readings from Last 3 Encounters:  06/28/20 164 lb (74.4 kg)  04/17/20 154 lb 9.6 oz (70.1 kg)  03/08/20 150 lb (68 kg)    Physical Exam  Results for orders placed or performed in visit on 07/04/20  Novel Coronavirus, NAA ([facility])   Specimen: Saline  Result Value Ref Range   SARS-CoV-2, NAA Detected (A) Not Detected  SARS-COV-2, NAA 2 DAY TAT  Result Value Ref Range   SARS-CoV-2, NAA 2 DAY TAT Performed       Assessment & Plan:   Problem List Items Addressed This Visit   None      Follow up plan: No follow-ups on file.   LABORATORY TESTING:  - Pap smear: pap done last year.  Abnormal.  Should be repeated with GYN.  IMMUNIZATIONS:   - Tdap: Tetanus vaccination status reviewed: last tetanus booster within 10 years. - Influenza: {Blank single:19197::"Up to date","Administered today","Postponed to flu season","Refused","Given elsewhere"} - Pneumovax: Not applicable -  Prevnar: Not applicable - HPV: {Blank single:19197::"Up to date","Administered today","Not applicable","Refused","Given elsewhere"} - Zostavax vaccine: Not applicable  SCREENING: -Mammogram: Not applicable  - Colonoscopy: Not applicable  - Bone Density: Not applicable  -Hearing Test: Not applicable  -Spirometry: Not applicable   PATIENT COUNSELING:   Advised to take 1 mg of folate supplement per day if capable of pregnancy.   Sexuality: Discussed sexually transmitted diseases, partner selection, use of condoms, avoidance of unintended pregnancy  and contraceptive alternatives.   Advised to avoid cigarette smoking.  I discussed with the patient that most people either abstain from alcohol or drink within safe limits (<=14/week and <=4 drinks/occasion for males, <=7/weeks and <= 3 drinks/occasion for females) and that the risk for alcohol disorders and other health effects  rises proportionally with the number of drinks per week and how often a drinker exceeds daily limits.  Discussed cessation/primary prevention of drug use and availability of treatment for abuse.   Diet: Encouraged to adjust caloric intake to maintain  or achieve ideal body weight, to reduce intake of dietary saturated fat and total fat, to limit sodium intake by avoiding high sodium foods and not adding table salt, and to maintain adequate dietary potassium and calcium preferably from fresh fruits, vegetables, and low-fat dairy products.    stressed the importance of regular exercise  Injury prevention: Discussed safety belts, safety helmets, smoke detector, smoking near bedding or upholstery.   Dental health: Discussed importance of regular tooth brushing, flossing, and dental visits.    NEXT PREVENTATIVE PHYSICAL DUE IN 1 YEAR. No follow-ups on file.

## 2020-08-16 ENCOUNTER — Encounter: Payer: Self-pay | Admitting: Nurse Practitioner

## 2020-08-16 DIAGNOSIS — Z0000 Encounter for general adult medical examination without abnormal findings: Secondary | ICD-10-CM

## 2020-08-16 DIAGNOSIS — F319 Bipolar disorder, unspecified: Secondary | ICD-10-CM

## 2020-08-16 DIAGNOSIS — K219 Gastro-esophageal reflux disease without esophagitis: Secondary | ICD-10-CM

## 2020-08-16 DIAGNOSIS — E669 Obesity, unspecified: Secondary | ICD-10-CM

## 2020-08-21 DIAGNOSIS — F338 Other recurrent depressive disorders: Secondary | ICD-10-CM | POA: Diagnosis not present

## 2020-08-22 DIAGNOSIS — F338 Other recurrent depressive disorders: Secondary | ICD-10-CM | POA: Diagnosis not present

## 2020-08-23 DIAGNOSIS — F338 Other recurrent depressive disorders: Secondary | ICD-10-CM | POA: Diagnosis not present

## 2020-08-25 DIAGNOSIS — F338 Other recurrent depressive disorders: Secondary | ICD-10-CM | POA: Diagnosis not present

## 2020-08-26 DIAGNOSIS — F338 Other recurrent depressive disorders: Secondary | ICD-10-CM | POA: Diagnosis not present

## 2020-08-29 DIAGNOSIS — F338 Other recurrent depressive disorders: Secondary | ICD-10-CM | POA: Diagnosis not present

## 2020-08-30 DIAGNOSIS — F338 Other recurrent depressive disorders: Secondary | ICD-10-CM | POA: Diagnosis not present

## 2020-08-31 DIAGNOSIS — F338 Other recurrent depressive disorders: Secondary | ICD-10-CM | POA: Diagnosis not present

## 2020-09-02 DIAGNOSIS — F338 Other recurrent depressive disorders: Secondary | ICD-10-CM | POA: Diagnosis not present

## 2020-09-04 DIAGNOSIS — F338 Other recurrent depressive disorders: Secondary | ICD-10-CM | POA: Diagnosis not present

## 2020-09-09 DIAGNOSIS — F338 Other recurrent depressive disorders: Secondary | ICD-10-CM | POA: Diagnosis not present

## 2020-09-12 DIAGNOSIS — F338 Other recurrent depressive disorders: Secondary | ICD-10-CM | POA: Diagnosis not present

## 2020-09-13 DIAGNOSIS — F338 Other recurrent depressive disorders: Secondary | ICD-10-CM | POA: Diagnosis not present

## 2020-09-16 ENCOUNTER — Encounter: Payer: Self-pay | Admitting: Nurse Practitioner

## 2020-09-16 ENCOUNTER — Telehealth: Payer: Self-pay

## 2020-09-16 ENCOUNTER — Telehealth (INDEPENDENT_AMBULATORY_CARE_PROVIDER_SITE_OTHER): Payer: Medicaid Other | Admitting: Nurse Practitioner

## 2020-09-16 DIAGNOSIS — N3000 Acute cystitis without hematuria: Secondary | ICD-10-CM

## 2020-09-16 MED ORDER — CEPHALEXIN 500 MG PO CAPS: 500.0000 mg | ORAL_CAPSULE | Freq: Three times a day (TID) | ORAL | 0 refills | Status: AC

## 2020-09-16 NOTE — Telephone Encounter (Signed)
Copied from CRM [id number]. Topic: Appointment Scheduling - Scheduling Inquiry for Clinic >> Sep 16, 2020  8:21 AM Alyssa Kemp wrote: Reason for CRM: Patient requesting a virtual visit due to her being in [place], patient experiencing stuffy nose no other symptoms, no fever, no cough. Patient experiencing frequent urination and states possible UTI. Patient has no transportation for in office appointment.

## 2020-09-16 NOTE — Telephone Encounter (Signed)
Called pt advised of Karen's message scheduled today

## 2020-09-16 NOTE — Telephone Encounter (Signed)
Patient can be added for a virtual appointment. Please let her know that we do not usually do virtual for UTI unless she can bring a sample but we will make an exception for this one time.

## 2020-09-16 NOTE — Progress Notes (Signed)
There were no vitals taken for this visit.   Subjective:    [name], female    DOB: September 28, 1990, 30 y.o.   MRN: [id number]  HPI: Alyssa Kemp is a 30 y.o. female  Chief Complaint  Patient presents with  . Urinary Frequency    Taking Azo, patient would like to know if she can have an antibiotic to see if it will help her, and if not she can come in next week for a ua  . Nasal Congestion   CONGESTION Patient states that she woke up with a stuffy nose yesterday but she took her allergy pill and it resolved.   URINARY SYMPTOMS Dysuria: burning Urinary frequency: yes Urgency: yes Small volume voids: yes Symptom severity: no Urinary incontinence: no Foul odor: no Hematuria: no Abdominal pain: no Back pain: no Suprapubic pain/pressure: no Flank pain: no Fever:  yes, no, subjective and low grade Vomiting: no Relief with cranberry juice: no Relief with pyridium: yes Status: stable Previous urinary tract infection: no Recurrent urinary tract infection: no Sexual activity: No sexually active/monogomous/practicing safe sex History of sexually transmitted disease: no Penile discharge: no Treatments attempted: pyridium   Relevant past medical, surgical, family and social history reviewed and updated as indicated. Interim medical history since our last visit reviewed. Allergies and medications reviewed and updated.  Review of Systems  Gastrointestinal: Negative for abdominal pain.  Genitourinary: Positive for decreased urine volume, dysuria, frequency and urgency. Negative for flank pain, hematuria and pelvic pain.  Musculoskeletal: Negative for back pain.    Per HPI unless specifically indicated above     Objective:    There were no vitals taken for this visit.  Wt Readings from Last 3 Encounters:  06/28/20 164 lb (74.4 kg)  04/17/20 154 lb 9.6 oz (70.1 kg)  03/08/20 150 lb (68 kg)    Physical Exam Vitals and nursing note reviewed.  HENT:      Head: Normocephalic.     Right Ear: Hearing normal.     Left Ear: Hearing normal.     Nose: Nose normal.  Eyes:     Pupils: Pupils are equal, round, and reactive to light.  Pulmonary:     Effort: Pulmonary effort is normal. No respiratory distress.  Neurological:     Mental Status: She is alert.  Psychiatric:        Mood and Affect: Mood normal.        Behavior: Behavior normal.        Thought Content: Thought content normal.        Judgment: Judgment normal.     Results for orders placed or performed in visit on 07/04/20  Novel Coronavirus, NAA ([facility])   Specimen: Saline  Result Value Ref Range   SARS-CoV-2, NAA Detected (A) Not Detected  SARS-COV-2, NAA 2 DAY TAT  Result Value Ref Range   SARS-CoV-2, NAA 2 DAY TAT Performed       Assessment & Plan:   Problem List Items Addressed This Visit   None   Visit Diagnoses    Acute cystitis without hematuria    -  Primary   Complete course of antibiotics. RTC if symptoms do not improve to leave urine sample. Patient has tolerated Keflex several times in the past.        Follow up plan: Return if symptoms worsen or fail to improve.     This visit was completed via MyChart due to the restrictions of the COVID-19 pandemic.  All issues as above were discussed and addressed. Physical exam was done as above through visual confirmation on MyChart. If it was felt that the patient should be evaluated in the office, they were directed there. The patient verbally consented to this visit. 1. Location of the patient: Home 2. Location of the provider: Office 3. Those involved with this call:  ? Provider: Larae Grooms, NP ? CMA: Tiffany Reel, CMA ? Front Desk/Registration: Harriet Pho 4. Time spent on call: 15 minutes with patient face to face via video conference. More than 50% of this time was spent in counseling and coordination of care. 20 minutes total spent in review of patient's record and preparation of their  chart.

## 2020-09-19 DIAGNOSIS — F338 Other recurrent depressive disorders: Secondary | ICD-10-CM | POA: Diagnosis not present

## 2020-09-26 ENCOUNTER — Telehealth: Payer: Self-pay | Admitting: Nurse Practitioner

## 2020-09-26 DIAGNOSIS — F338 Other recurrent depressive disorders: Secondary | ICD-10-CM | POA: Diagnosis not present

## 2020-09-26 MED ORDER — FLUCONAZOLE 150 MG PO TABS: 150.0000 mg | ORAL_TABLET | Freq: Once | ORAL | 0 refills | Status: AC

## 2020-09-26 NOTE — Telephone Encounter (Signed)
Pt is calling because she has development a yeast infection after taking an antibiotic. Would like a script for medication for a yeast infection. Preferred Pharmacy- [facility] [place], [place]- [phone]

## 2020-09-26 NOTE — Telephone Encounter (Signed)
Patient notified and verbalized understanding. 

## 2020-10-02 DIAGNOSIS — F338 Other recurrent depressive disorders: Secondary | ICD-10-CM | POA: Diagnosis not present

## 2020-10-10 DIAGNOSIS — F338 Other recurrent depressive disorders: Secondary | ICD-10-CM | POA: Diagnosis not present

## 2020-10-17 DIAGNOSIS — F338 Other recurrent depressive disorders: Secondary | ICD-10-CM | POA: Diagnosis not present

## 2020-10-24 DIAGNOSIS — F338 Other recurrent depressive disorders: Secondary | ICD-10-CM | POA: Diagnosis not present

## 2020-10-30 ENCOUNTER — Telehealth (INDEPENDENT_AMBULATORY_CARE_PROVIDER_SITE_OTHER): Payer: Medicaid Other | Admitting: Nurse Practitioner

## 2020-10-30 ENCOUNTER — Encounter: Payer: Self-pay | Admitting: Nurse Practitioner

## 2020-10-30 DIAGNOSIS — R197 Diarrhea, unspecified: Secondary | ICD-10-CM

## 2020-10-30 DIAGNOSIS — F338 Other recurrent depressive disorders: Secondary | ICD-10-CM | POA: Diagnosis not present

## 2020-10-30 NOTE — Progress Notes (Signed)
There were no vitals taken for this visit.   Subjective:    [name], female    DOB: [birth year], 30 y.o.   MRN: [id number]  HPI: Alyssa Kemp is a 30 y.o. female  Chief Complaint  Patient presents with  . Abdominal Cramping    Bloating and discomfort.  Watery diarrhea up to 3-4 times a day at times when it flares up.  Would like to discuss medication options.    ABDOMINAL ISSUES Patient states she has had abdominal issues for about 2 years on and off.  Patient states she recently has had watery diarrhea up to 3-4 times per day.  Patient states she has had some abdominal cramping and bloating.  States her bowel movements will be normal for about a week and then suddenly she will have several bowel movements daily. Patient states she also has some hemorrhoids. Denies vomiting.    Relevant past medical, surgical, family and social history reviewed and updated as indicated. Interim medical history since our last visit reviewed. Allergies and medications reviewed and updated.  Review of Systems  Gastrointestinal: Positive for diarrhea.       Abdominal cramping and bloating    Per HPI unless specifically indicated above     Objective:    There were no vitals taken for this visit.  Wt Readings from Last 3 Encounters:  06/28/20 164 lb (74.4 kg)  04/17/20 154 lb 9.6 oz (70.1 kg)  03/08/20 150 lb (68 kg)    Physical Exam Vitals and nursing note reviewed.  HENT:     Head: Normocephalic.     Right Ear: Hearing normal.     Left Ear: Hearing normal.     Nose: Nose normal.  Eyes:     Pupils: Pupils are equal, round, and reactive to light.  Pulmonary:     Effort: Pulmonary effort is normal. No respiratory distress.  Neurological:     Mental Status: She is alert.  Psychiatric:        Mood and Affect: Mood normal.        Behavior: Behavior normal.        Thought Content: Thought content normal.        Judgment: Judgment normal.     Results for  orders placed or performed in visit on 07/04/20  Novel Coronavirus, NAA ([facility])   Specimen: Saline  Result Value Ref Range   SARS-CoV-2, NAA Detected (A) Not Detected  SARS-COV-2, NAA 2 DAY TAT  Result Value Ref Range   SARS-CoV-2, NAA 2 DAY TAT Performed       Assessment & Plan:   Problem List Items Addressed This Visit   None   Visit Diagnoses    Diarrhea, unspecified type    -  Primary   Recommend patient see Gastroenterologist for further evaluation and treatment.   Relevant Orders   Ambulatory referral to Gastroenterology       Follow up plan: Return if symptoms worsen or fail to improve.      This visit was completed via MyChart due to the restrictions of the COVID-19 pandemic. All issues as above were discussed and addressed. Physical exam was done as above through visual confirmation on MyChart. If it was felt that the patient should be evaluated in the office, they were directed there. The patient verbally consented to this visit. 1. Location of the patient: Home 2. Location of the provider: Office 3. Those involved with this call:  ? Provider: Larae Grooms,  NP ? CMA: Tiffany Reel, CMa ? Front Desk/Registration: Harriet Pho 4. Time spent on call: 15 minutes with patient face to face via video conference. More than 50% of this time was spent in counseling and coordination of care. 20 minutes total spent in review of patient's record and preparation of their chart.

## 2020-10-31 ENCOUNTER — Encounter: Payer: Medicaid Other | Admitting: Nurse Practitioner

## 2020-11-06 DIAGNOSIS — F338 Other recurrent depressive disorders: Secondary | ICD-10-CM | POA: Diagnosis not present

## 2020-11-13 DIAGNOSIS — F338 Other recurrent depressive disorders: Secondary | ICD-10-CM | POA: Diagnosis not present

## 2020-11-20 DIAGNOSIS — F338 Other recurrent depressive disorders: Secondary | ICD-10-CM | POA: Diagnosis not present

## 2020-11-27 DIAGNOSIS — F338 Other recurrent depressive disorders: Secondary | ICD-10-CM | POA: Diagnosis not present

## 2020-12-04 DIAGNOSIS — F338 Other recurrent depressive disorders: Secondary | ICD-10-CM | POA: Diagnosis not present

## 2020-12-10 DIAGNOSIS — F338 Other recurrent depressive disorders: Secondary | ICD-10-CM | POA: Diagnosis not present

## 2020-12-17 DIAGNOSIS — F338 Other recurrent depressive disorders: Secondary | ICD-10-CM | POA: Diagnosis not present

## 2020-12-19 ENCOUNTER — Ambulatory Visit: Payer: Medicaid Other | Admitting: Gastroenterology

## 2020-12-22 DIAGNOSIS — R059 Cough, unspecified: Secondary | ICD-10-CM | POA: Diagnosis not present

## 2020-12-22 DIAGNOSIS — Z20822 Contact with and (suspected) exposure to covid-19: Secondary | ICD-10-CM | POA: Diagnosis not present

## 2020-12-22 DIAGNOSIS — J0140 Acute pansinusitis, unspecified: Secondary | ICD-10-CM | POA: Diagnosis not present

## 2020-12-22 DIAGNOSIS — R0981 Nasal congestion: Secondary | ICD-10-CM | POA: Diagnosis not present

## 2020-12-22 DIAGNOSIS — R0982 Postnasal drip: Secondary | ICD-10-CM | POA: Diagnosis not present

## 2020-12-25 DIAGNOSIS — F338 Other recurrent depressive disorders: Secondary | ICD-10-CM | POA: Diagnosis not present

## 2021-01-01 DIAGNOSIS — F338 Other recurrent depressive disorders: Secondary | ICD-10-CM | POA: Diagnosis not present

## 2021-01-08 DIAGNOSIS — F338 Other recurrent depressive disorders: Secondary | ICD-10-CM | POA: Diagnosis not present

## 2021-01-15 DIAGNOSIS — F338 Other recurrent depressive disorders: Secondary | ICD-10-CM | POA: Diagnosis not present

## 2021-01-20 ENCOUNTER — Telehealth: Payer: Medicaid Other | Admitting: Nurse Practitioner

## 2021-01-20 NOTE — Telephone Encounter (Signed)
Pt is calling to ask what her balance is - Please advise CB- [phone]

## 2021-01-21 NOTE — Telephone Encounter (Signed)
Pt was called and given the information that was requested.

## 2021-01-22 DIAGNOSIS — F338 Other recurrent depressive disorders: Secondary | ICD-10-CM | POA: Diagnosis not present

## 2021-02-11 DIAGNOSIS — F338 Other recurrent depressive disorders: Secondary | ICD-10-CM | POA: Diagnosis not present

## 2021-02-25 ENCOUNTER — Ambulatory Visit: Payer: Medicaid Other | Admitting: Gastroenterology

## 2021-02-26 DIAGNOSIS — F338 Other recurrent depressive disorders: Secondary | ICD-10-CM | POA: Diagnosis not present

## 2021-03-05 NOTE — Progress Notes (Signed)
,  BP 115/74   Pulse 97   Temp 99.2 F (37.3 C) (Oral)   Ht 5\' 4"  (1.626 m)   Wt 171 lb 12.8 oz (77.9 kg)   SpO2 97%   BMI 29.49 kg/m    Subjective:    Patient ID: , female    DOB: 12-01-1990, 30 y.o.   MRN: [id number]  HPI: Alyssa Kemp is a 30 y.o. female presenting on 03/06/2021 for comprehensive medical examination. Current medical complaints include:none  She currently lives with: Menopausal Symptoms: no  MOOD Patient states she feels like her anxiety has improved.  She sees a holistic therapist weekly that helps.  States her circumstances have improved also which has helped her anxiety.   Patient states she has been having more acid reflux.  She does have an appointment with GI doctor next week.  She would like a refill of her Omeprazole.  Patient states she got sick at the end of July and feels like her lungs are just not the same at night.  Has some SOB at night when laying down.  Denies HA, CP, dizziness, palpitations, visual changes, and lower extremity swelling.   Depression Screen done today and results listed below:  Depression screen [facility] 2/9 03/06/2021 07/31/2019 07/11/2018 03/31/2018 07/31/2016  Decreased Interest 0 0 0 0 0  Down, Depressed, Hopeless 0 0 0 0 0  PHQ - 2 Score 0 0 0 0 0  Altered sleeping - 0 0 0 -  Tired, decreased energy - 0 0 0 -  Change in appetite - 0 0 3 -  Feeling bad or failure about yourself  - 0 0 1 -  Trouble concentrating - 0 3 0 -  Moving slowly or fidgety/restless - 0 0 0 -  Suicidal thoughts - 0 - 0 -  PHQ-9 Score - 0 3 4 -  Difficult doing work/chores - - Not difficult at all - -    The patient does not have a history of falls. I did complete a risk assessment for falls. A plan of care for falls was documented.   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   Scoliosis     Surgical History:  Past Surgical History:  Procedure Laterality Date   ADENOIDECTOMY     COLPOSCOPY  01/15/2016   CIN 1   CYSTOSCOPY W/ RETROGRADES Bilateral 12/28/2017   Procedure: CYSTOSCOPY WITH RETROGRADE PYELOGRAM;  Surgeon: 02/28/2018, MD;  Location: [facility] ORS;  Service: Urology;  Laterality: Bilateral;   TONSILLECTOMY     TUBAL LIGATION Bilateral 09/19/2016   Procedure: POST PARTUM TUBAL LIGATION;  Surgeon: 09/21/2016, MD;  Location: [facility] ORS;  Service: Gynecology;  Laterality: Bilateral;   WISDOM TOOTH EXTRACTION      Medications:  Current Outpatient Medications on File Prior to Visit  Medication Sig   cetirizine (ZYRTEC) 10 MG tablet Take 10 mg by mouth daily.   EPINEPHrine (EPIPEN 2-PAK) 0.3 mg/0.3 mL IJ SOAJ injection Inject 0.3 mLs (0.3 mg total) into the muscle as needed for anaphylaxis.   Multiple Vitamin (MULTIVITAMINS PO) Take by mouth daily.   VYVANSE 20 MG capsule Take 20 mg by mouth  every morning.   No current facility-administered medications on file prior to visit.    Allergies:  Allergies  Allergen Reactions   Sulfacetamide Sodium Shortness Of Breath   Sulfasalazine Shortness Of Breath   Tussionex Pennkinetic Er [Hydrocod Polst-Cpm Polst Er] Hives and Swelling   Doxycycline Swelling   Macrobid [Nitrofurantoin] Hives   Amoxicillin-Pot Clavulanate Other (See Comments)    Unknown- from childhood    Social History:  Social History   Socioeconomic History   Marital status: Single    Spouse name: Not on file   Number of children: Not on file   Years of education: Not on file   Highest education level: Not on file  Occupational History   Not on file  Tobacco Use   Smoking status: Never   Smokeless tobacco: Never  Vaping Use   Vaping Use: Never used  Substance and  Sexual Activity   Alcohol use: No   Drug use: No   Sexual activity: Yes    Birth control/protection: None, Surgical  Other Topics Concern   Not on file  Social History Narrative   Not on file   Social Determinants of Health   Financial Resource Strain: Not on file  Food Insecurity: Not on file  Transportation Needs: Not on file  Physical Activity: Not on file  Stress: Not on file  Social Connections: Not on file  Intimate Partner Violence: Not on file   Social History   Tobacco Use  Smoking Status Never  Smokeless Tobacco Never   Social History   Substance and Sexual Activity  Alcohol Use No    Family History:  Family History  Problem Relation Age of Onset   Cervical cancer Mother    Ovarian cancer Mother    Hypertension Maternal Grandmother    Pancreatic cancer Maternal Grandmother    Diabetes Maternal Grandmother    Coronary artery disease Maternal Grandfather    Congestive Heart Failure Maternal Grandfather    Hypertension Maternal Grandfather    Prostate cancer Maternal Grandfather    Diabetes Maternal Grandfather    Hypertension Paternal Grandmother    Diabetes Paternal Grandmother    Heart disease Paternal Grandfather    Hypertension Paternal Grandfather    Breast cancer Paternal Aunt     Past medical history, surgical history, medications, allergies, family history and social history reviewed with patient today and changes made to appropriate areas of the chart.   Review of Systems  Eyes:  Negative for blurred vision and double vision.  Respiratory:  Positive for shortness of breath.   Cardiovascular:  Negative for chest pain, palpitations and leg swelling.  Neurological:  Negative for dizziness and headaches.  Psychiatric/Behavioral:  Positive for suicidal ideas. Negative for depression. The patient is not nervous/anxious.   All other ROS negative except what is listed above and in the HPI.      Objective:    BP 115/74   Pulse 97   Temp 99.2  F (37.3 C) (Oral)   Ht 5\' 4"  (1.626 m)   Wt 171 lb 12.8 oz (77.9 kg)   SpO2 97%   BMI 29.49 kg/m   Wt Readings from Last 3 Encounters:  03/06/21 171 lb 12.8 oz (77.9 kg)  06/28/20 164 lb (74.4 kg)  04/17/20 154 lb 9.6 oz (70.1 kg)    Physical Exam Vitals and nursing note reviewed. Exam conducted with a chaperone present Wilhemena Durie, CMA.).  Constitutional:      General: She is awake. She is not in  acute distress.    Appearance: She is well-developed. She is not ill-appearing.  HENT:     Head: Normocephalic and atraumatic.     Right Ear: Hearing, tympanic membrane, ear canal and external ear normal. No drainage.     Left Ear: Hearing, tympanic membrane, ear canal and external ear normal. No drainage.     Nose: Nose normal.     Right Sinus: No maxillary sinus tenderness or frontal sinus tenderness.     Left Sinus: No maxillary sinus tenderness or frontal sinus tenderness.     Mouth/Throat:     Mouth: Mucous membranes are moist.     Pharynx: Oropharynx is clear. Uvula midline. No pharyngeal swelling, oropharyngeal exudate or posterior oropharyngeal erythema.  Eyes:     General: Lids are normal.        Right eye: No discharge.        Left eye: No discharge.     Extraocular Movements: Extraocular movements intact.     Conjunctiva/sclera: Conjunctivae normal.     Pupils: Pupils are equal, round, and reactive to light.     Visual Fields: Right eye visual fields normal and left eye visual fields normal.  Neck:     Thyroid: No thyromegaly.     Vascular: No carotid bruit.     Trachea: Trachea normal.  Cardiovascular:     Rate and Rhythm: Normal rate and regular rhythm.     Heart sounds: Normal heart sounds. No murmur heard.   No gallop.  Pulmonary:     Effort: Pulmonary effort is normal. No accessory muscle usage or respiratory distress.     Breath sounds: Normal breath sounds.  Chest:  Breasts:    Right: Normal.     Left: Normal.  Abdominal:     General: Bowel sounds  are normal.     Palpations: Abdomen is soft. There is no hepatomegaly or splenomegaly.     Tenderness: There is no abdominal tenderness.  Genitourinary:    General: Normal vulva.     Vagina: Normal.     Cervix: Normal.     Adnexa: Right adnexa normal and left adnexa normal.  Musculoskeletal:        General: Normal range of motion.     Cervical back: Normal range of motion and neck supple.     Right lower leg: No edema.     Left lower leg: No edema.  Lymphadenopathy:     Head:     Right side of head: No submental, submandibular, tonsillar, preauricular or posterior auricular adenopathy.     Left side of head: No submental, submandibular, tonsillar, preauricular or posterior auricular adenopathy.     Cervical: No cervical adenopathy.     Upper Body:     Right upper body: No supraclavicular, axillary or pectoral adenopathy.     Left upper body: No supraclavicular, axillary or pectoral adenopathy.  Skin:    General: Skin is warm and dry.     Capillary Refill: Capillary refill takes less than 2 seconds.     Findings: No rash.  Neurological:     Mental Status: She is alert and oriented to person, place, and time.     Cranial Nerves: Cranial nerves are intact.     Gait: Gait is intact.     Deep Tendon Reflexes: Reflexes are normal and symmetric.     Reflex Scores:      Brachioradialis reflexes are 2+ on the right side and 2+ on the left side.  Patellar reflexes are 2+ on the right side and 2+ on the left side. Psychiatric:        Attention and Perception: Attention normal.        Mood and Affect: Mood normal.        Speech: Speech normal.        Behavior: Behavior normal. Behavior is cooperative.        Thought Content: Thought content normal.        Judgment: Judgment normal.    Results for orders placed or performed in visit on 07/04/20  Novel Coronavirus, NAA ([facility])   Specimen: Saline  Result Value Ref Range   SARS-CoV-2, NAA Detected (A) Not Detected  SARS-COV-2,  NAA 2 DAY TAT  Result Value Ref Range   SARS-CoV-2, NAA 2 DAY TAT Performed       Assessment & Plan:   Problem List Items Addressed This Visit       Digestive   GERD (gastroesophageal reflux disease)    Chronic. Not well controlled. Will send Omeprazole to the pharmacy for patient.  Side effects and benefits of medication discussed with patient during visit. Keep appointment with GI.      Relevant Medications   omeprazole (PRILOSEC) 20 MG capsule     Other   Anxiety, generalized    Controlled.  See's therapist for Bipolar symptoms.      Bipolar 1 disorder (HCC)    Controlled.  See's therapist for Bipolar symptoms.      Obesity (BMI 30.0-34.9)    Maintain a healthy lifestyle through diet and exercise.      Other Visit Diagnoses     Annual physical exam    -  Primary   Health maintenance reviewed during visit today.  Labs ordered.  PAP obtained. Gardasil given.   Relevant Orders   CBC with Differential/Platelet   Comprehensive metabolic panel   Lipid panel   TSH   Urinalysis, Routine w reflex microscopic   Screening for cervical cancer       PAP obtained in normal fashion.  Patient tolerated obtaining without complication.  Escorted By Wilhemena Durie, CMA.   Relevant Orders   Cytology - PAP        Follow up plan: Return in about 1 year (around 03/06/2022) for Physical and Fasting labs.   LABORATORY TESTING:  - Pap smear: pap done  IMMUNIZATIONS:   - Tdap: Tetanus vaccination status reviewed: last tetanus booster within 10 years. - Influenza: Refused - Pneumovax: Not applicable - Prevnar: Not applicable - HPV: Administered today - Zostavax vaccine: Not applicable  SCREENING: -Mammogram: Not applicable  - Colonoscopy: Not applicable  - Bone Density: Not applicable  -Hearing Test: Not applicable  -Spirometry: Not applicable   PATIENT COUNSELING:   Advised to take 1 mg of folate supplement per day if capable of pregnancy.   Sexuality: Discussed  sexually transmitted diseases, partner selection, use of condoms, avoidance of unintended pregnancy  and contraceptive alternatives.   Advised to avoid cigarette smoking.  I discussed with the patient that most people either abstain from alcohol or drink within safe limits (<=14/week and <=4 drinks/occasion for males, <=7/weeks and <= 3 drinks/occasion for females) and that the risk for alcohol disorders and other health effects rises proportionally with the number of drinks per week and how often a drinker exceeds daily limits.  Discussed cessation/primary prevention of drug use and availability of treatment for abuse.   Diet: Encouraged to adjust caloric intake to maintain  or achieve  ideal body weight, to reduce intake of dietary saturated fat and total fat, to limit sodium intake by avoiding high sodium foods and not adding table salt, and to maintain adequate dietary potassium and calcium preferably from fresh fruits, vegetables, and low-fat dairy products.    stressed the importance of regular exercise  Injury prevention: Discussed safety belts, safety helmets, smoke detector, smoking near bedding or upholstery.   Dental health: Discussed importance of regular tooth brushing, flossing, and dental visits.    NEXT PREVENTATIVE PHYSICAL DUE IN 1 YEAR. Return in about 1 year (around 03/06/2022) for Physical and Fasting labs.

## 2021-03-06 ENCOUNTER — Encounter: Payer: Self-pay | Admitting: Nurse Practitioner

## 2021-03-06 ENCOUNTER — Other Ambulatory Visit (HOSPITAL_COMMUNITY)
Admission: RE | Admit: 2021-03-06 | Discharge: 2021-03-06 | Disposition: A | Discharge status: Home / Self Care | Payer: Medicaid Other | Source: Ambulatory Visit | Attending: Nurse Practitioner | Admitting: Nurse Practitioner

## 2021-03-06 ENCOUNTER — Other Ambulatory Visit: Payer: Self-pay | Admitting: Nurse Practitioner

## 2021-03-06 ENCOUNTER — Other Ambulatory Visit: Payer: Self-pay

## 2021-03-06 ENCOUNTER — Ambulatory Visit (INDEPENDENT_AMBULATORY_CARE_PROVIDER_SITE_OTHER): Payer: Medicaid Other | Admitting: Nurse Practitioner

## 2021-03-06 VITALS — BP 115/74 | HR 97 | Temp 99.2°F | Ht 64.0 in | Wt 171.8 lb

## 2021-03-06 DIAGNOSIS — Z124 Encounter for screening for malignant neoplasm of cervix: Secondary | ICD-10-CM

## 2021-03-06 DIAGNOSIS — E669 Obesity, unspecified: Secondary | ICD-10-CM | POA: Diagnosis not present

## 2021-03-06 DIAGNOSIS — F411 Generalized anxiety disorder: Secondary | ICD-10-CM | POA: Diagnosis not present

## 2021-03-06 DIAGNOSIS — Z0000 Encounter for general adult medical examination without abnormal findings: Secondary | ICD-10-CM | POA: Diagnosis not present

## 2021-03-06 DIAGNOSIS — F319 Bipolar disorder, unspecified: Secondary | ICD-10-CM

## 2021-03-06 DIAGNOSIS — Z23 Encounter for immunization: Secondary | ICD-10-CM | POA: Diagnosis not present

## 2021-03-06 DIAGNOSIS — K219 Gastro-esophageal reflux disease without esophagitis: Secondary | ICD-10-CM | POA: Diagnosis not present

## 2021-03-06 LAB — MICROSCOPIC EXAMINATION
Bacteria, UA: NONE SEEN
WBC, UA: NONE SEEN /hpf (ref 0–5)

## 2021-03-06 LAB — URINALYSIS, ROUTINE W REFLEX MICROSCOPIC
Bilirubin, UA: NEGATIVE
Glucose, UA: NEGATIVE
Ketones, UA: NEGATIVE
Leukocytes,UA: NEGATIVE
Nitrite, UA: NEGATIVE
Protein,UA: NEGATIVE
Specific Gravity, UA: 1.015 (ref 1.005–1.030)
Urobilinogen, Ur: 0.2 mg/dL (ref 0.2–1.0)
pH, UA: 6 (ref 5.0–7.5)

## 2021-03-06 MED ORDER — OMEPRAZOLE 20 MG PO CPDR: 20.0000 mg | DELAYED_RELEASE_CAPSULE | Freq: Every day | ORAL | 1 refills | Status: DC

## 2021-03-06 NOTE — Assessment & Plan Note (Signed)
Chronic. Not well controlled. Will send Omeprazole to the pharmacy for patient.  Side effects and benefits of medication discussed with patient during visit. Keep appointment with GI.

## 2021-03-06 NOTE — Addendum Note (Signed)
Addended by: Pablo Ledger on: 03/06/2021 11:58 AM   Modules accepted: Orders

## 2021-03-06 NOTE — Progress Notes (Signed)
Hi Alyssa Kemp. Your urine from today looks good.  I will send you another message once the rest of your lab work comes back.

## 2021-03-06 NOTE — Assessment & Plan Note (Signed)
Controlled.  See's therapist for Bipolar symptoms. 

## 2021-03-06 NOTE — Assessment & Plan Note (Signed)
Maintain a healthy lifestyle through diet and exercise.  

## 2021-03-06 NOTE — Telephone Encounter (Signed)
Medication Refill - Medication:  cetirizine (ZYRTEC) 10 MG tablet  Has the patient contacted their pharmacy? Yes.   (Agent: If no, request that the patient contact the pharmacy for the refill.) (Agent: If yes, when and what did the pharmacy advise?) pharmacy advised pt to call office since Rx was expired   Preferred Pharmacy (with phone number or street name):  [facility] [phone] - [place], [place] - [address] Phone:  [phone]  Fax:  [phone]      Agent: Please be advised that RX refills may take up to 3 business days. We ask that you follow-up with your pharmacy.

## 2021-03-06 NOTE — Assessment & Plan Note (Signed)
Controlled.  See's therapist for Bipolar symptoms.

## 2021-03-06 NOTE — Telephone Encounter (Signed)
Requested medication (s) are due for refill today: historical medication  Requested medication (s) are on the active medication list: yes  Last refill:  08/28/20  Future visit scheduled: no seen today   Notes to clinic:  historical medication . Do you want to renew Rx?     Requested Prescriptions  Pending Prescriptions Disp Refills   cetirizine (ZYRTEC) 10 MG tablet [Pharmacy Med Name: CETIRIZINE 10MG  TABLETS] 30 tablet     Sig: TAKE 1 TABLET(10 MG) BY MOUTH DAILY     Ear, Nose, and Throat:  Antihistamines Passed - 03/06/2021 11:54 AM      Passed - Valid encounter within last 12 months    Recent Outpatient Visits           Today Annual physical exam   [facility], NP   4 months ago Diarrhea, unspecified type   [facility], NP   5 months ago Acute cystitis without hematuria   [facility], NP   8 months ago Hives   [facility], [place], DO   8 months ago Suspected COVID-19 virus infection   [facility], [place], DO

## 2021-03-06 NOTE — Telephone Encounter (Signed)
Requested medication (s) are due for refill today: see previous request .  Requested medication (s) are on the active medication list: yes  Last refill:  historical med  Future visit scheduled: no seen today   Notes to clinic:  historical medication      Requested Prescriptions  Pending Prescriptions Disp Refills   cetirizine (ZYRTEC) 10 MG tablet [Pharmacy Med Name: CETIRIZINE 10MG  TABLETS] 30 tablet     Sig: TAKE 1 TABLET(10 MG) BY MOUTH DAILY     Ear, Nose, and Throat:  Antihistamines Passed - 03/06/2021 11:54 AM      Passed - Valid encounter within last 12 months    Recent Outpatient Visits           Today Annual physical exam   [facility], NP   4 months ago Diarrhea, unspecified type   [facility], NP   5 months ago Acute cystitis without hematuria   [facility], NP   8 months ago Hives   [facility], [place], DO   8 months ago Suspected COVID-19 virus infection   [facility] [place], [place], DO

## 2021-03-07 LAB — CBC WITH DIFFERENTIAL/PLATELET
Basophils Absolute: 0.1 10*3/uL (ref 0.0–0.2)
Basos: 1 %
EOS (ABSOLUTE): 0.5 10*3/uL — ABNORMAL HIGH (ref 0.0–0.4)
Eos: 5 %
Hematocrit: 40.9 % (ref 34.0–46.6)
Hemoglobin: 14.5 g/dL (ref 11.1–15.9)
Immature Grans (Abs): 0 10*3/uL (ref 0.0–0.1)
Immature Granulocytes: 0 %
Lymphocytes Absolute: 2.5 10*3/uL (ref 0.7–3.1)
Lymphs: 28 %
MCH: 30.9 pg (ref 26.6–33.0)
MCHC: 35.5 g/dL (ref 31.5–35.7)
MCV: 87 fL (ref 79–97)
Monocytes Absolute: 0.7 10*3/uL (ref 0.1–0.9)
Monocytes: 8 %
Neutrophils Absolute: 5.1 10*3/uL (ref 1.4–7.0)
Neutrophils: 58 %
Platelets: 303 10*3/uL (ref 150–450)
RBC: 4.7 x10E6/uL (ref 3.77–5.28)
RDW: 13.1 % (ref 11.7–15.4)
WBC: 8.9 10*3/uL (ref 3.4–10.8)

## 2021-03-07 LAB — COMPREHENSIVE METABOLIC PANEL
ALT: 19 IU/L (ref 0–32)
AST: 23 IU/L (ref 0–40)
Albumin/Globulin Ratio: 2.1 (ref 1.2–2.2)
Albumin: 4.8 g/dL (ref 3.9–5.0)
Alkaline Phosphatase: 54 IU/L (ref 44–121)
BUN/Creatinine Ratio: 14 (ref 9–23)
BUN: 11 mg/dL (ref 6–20)
Bilirubin Total: 0.6 mg/dL (ref 0.0–1.2)
CO2: 22 mmol/L (ref 20–29)
Calcium: 10 mg/dL (ref 8.7–10.2)
Chloride: 104 mmol/L (ref 96–106)
Creatinine, Ser: 0.79 mg/dL (ref 0.57–1.00)
Globulin, Total: 2.3 g/dL (ref 1.5–4.5)
Glucose: 94 mg/dL (ref 65–99)
Potassium: 4.6 mmol/L (ref 3.5–5.2)
Sodium: 140 mmol/L (ref 134–144)
Total Protein: 7.1 g/dL (ref 6.0–8.5)
eGFR: 104 mL/min/{1.73_m2} (ref >59)

## 2021-03-07 LAB — LIPID PANEL
Chol/HDL Ratio: 3.7 ratio (ref 0.0–4.4)
Cholesterol, Total: 224 mg/dL — ABNORMAL HIGH (ref 100–199)
HDL: 60 mg/dL (ref >39)
LDL Chol Calc (NIH): 139 mg/dL — ABNORMAL HIGH (ref 0–99)
Triglycerides: 143 mg/dL (ref 0–149)
VLDL Cholesterol Cal: 25 mg/dL (ref 5–40)

## 2021-03-07 LAB — TSH: TSH: 1.34 u[IU]/mL (ref 0.450–4.500)

## 2021-03-07 NOTE — Progress Notes (Signed)
Hi Alyssa Kemp, it was good to see you yesterday.  Your lab work looks good.  Your cholesterol is elevated.  I recommend following a low fat diet and exercise.  We will continue to monitor at future appointments.  See you at our next visit.

## 2021-03-12 ENCOUNTER — Other Ambulatory Visit: Payer: Self-pay

## 2021-03-12 ENCOUNTER — Ambulatory Visit (INDEPENDENT_AMBULATORY_CARE_PROVIDER_SITE_OTHER): Payer: Medicaid Other | Admitting: Gastroenterology

## 2021-03-12 ENCOUNTER — Encounter: Payer: Self-pay | Admitting: Gastroenterology

## 2021-03-12 VITALS — BP 111/68 | HR 76 | Temp 98.1°F | Ht 64.0 in | Wt 173.0 lb

## 2021-03-12 DIAGNOSIS — R197 Diarrhea, unspecified: Secondary | ICD-10-CM

## 2021-03-12 NOTE — Progress Notes (Signed)
Gastroenterology Consultation  Referring Provider:     Larae Grooms, NP Primary Care Physician:  Larae Grooms, NP Primary Gastroenterologist:  Dr. Servando Snare     Reason for Consultation:     Diarrhea        HPI:   Alyssa Kemp is a 30 y.o. y/o female referred for consultation & management of Diarrhea by Dr. Larae Grooms, NP.  This patient reports that she has had a change in bowel habits that occurred immediately after having her tubes tied.  She reports that she had been on antibiotics throughout her pregnancy due to recurrent urinary tract infections.  She also reports that she was on antibiotics around the time of her tubal ligation.  Since then she has had soft stools without any other worrisome symptoms such as unexplained weight loss fevers chills nausea vomiting black stools or bloody stools.  She also denies any family history of colon cancer but states that she had a grandfather with a colon polyp.  The patient reports that sometimes when she has the diarrhea she has abdominal cramping.  The diarrhea never wakes her up from sleep and she reports that she has good days and bad days.  She does state that the last 2 weeks has been better than usual.  The patient has noticed that sometimes no products especially Brett Albino will upset her stomach. She also reports that she had chronic GERD and has been on Prilosec for years.  She stopped the Prilosec to see if the diarrhea was caused by the Prilosec but did not notice any difference.  She therefore went back on the Prilosec and states it is helping her greatly.  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   Scoliosis     Past Surgical History:   Procedure Laterality Date   ADENOIDECTOMY     COLPOSCOPY  01/15/2016   CIN 1   CYSTOSCOPY W/ RETROGRADES Bilateral 12/28/2017   Procedure: CYSTOSCOPY WITH RETROGRADE PYELOGRAM;  Surgeon: [name], MD;  Location: [facility] ORS;  Service: Urology;  Laterality: Bilateral;   TONSILLECTOMY     TUBAL LIGATION Bilateral 09/19/2016   Procedure: POST PARTUM TUBAL LIGATION;  Surgeon: [name], MD;  Location: [facility] ORS;  Service: Gynecology;  Laterality: Bilateral;   WISDOM TOOTH EXTRACTION      Prior to Admission medications   Medication Sig Start Date End Date Taking? Authorizing Provider  cetirizine (ZYRTEC) 10 MG tablet TAKE 1 TABLET(10 MG) BY MOUTH DAILY 03/06/21  Yes Larae Grooms, NP  EPINEPHrine (EPIPEN 2-PAK) 0.3 mg/0.3 mL IJ SOAJ injection Inject 0.3 mLs (0.3 mg total) into the muscle as needed for anaphylaxis. 12/15/19  Yes Particia Nearing, PA-C  Multiple Vitamin (MULTIVITAMINS PO) Take by mouth daily.   Yes [provider]  omeprazole (PRILOSEC) 20 MG capsule Take 1 capsule (20 mg total) by mouth daily. 03/06/21  Yes Larae Grooms, NP  VYVANSE 20 MG capsule Take 20 mg by mouth every morning. 05/31/20  Yes [provider]    Family History  Problem Relation Age of Onset   Cervical cancer Mother    Ovarian cancer Mother    Hypertension Maternal Grandmother    Pancreatic cancer Maternal Grandmother  Diabetes Maternal Grandmother    Coronary artery disease Maternal Grandfather    Congestive Heart Failure Maternal Grandfather    Hypertension Maternal Grandfather    Prostate cancer Maternal Grandfather    Diabetes Maternal Grandfather    Hypertension Paternal Grandmother    Diabetes Paternal Grandmother    Heart disease Paternal Grandfather    Hypertension Paternal Grandfather    Breast cancer Paternal Aunt      Social History   Tobacco Use   Smoking status: Never   Smokeless tobacco: Never  Vaping Use   Vaping Use: Never used   Substance Use Topics   Alcohol use: No   Drug use: No    Allergies as of 03/12/2021 - Review Complete 03/12/2021  Allergen Reaction Noted   Sulfacetamide sodium Shortness Of Breath 12/18/2015   Sulfasalazine Shortness Of Breath 12/31/2014   Tussionex pennkinetic er [hydrocod polst-cpm polst er] Hives and Swelling 07/09/2020   Doxycycline Swelling 12/18/2015   Macrobid [nitrofurantoin] Hives 06/28/2020   Amoxicillin-pot clavulanate Other (See Comments) 12/18/2015    Review of Systems:    All systems reviewed and negative except where noted in HPI.   Physical Exam:  BP 111/68 (BP Location: Left Arm, Patient Position: Sitting, Cuff Size: Large)   Pulse 76   Temp 98.1 F (36.7 C) (Temporal)   Ht 5\' 4"  (1.626 m)   Wt 173 lb (78.5 kg)   BMI 29.70 kg/m  No LMP recorded. (Menstrual status: Irregular Periods). General:   Alert,  Well-developed, well-nourished, pleasant and cooperative in NAD Head:  Normocephalic and atraumatic. Eyes:  Sclera clear, no icterus.   Conjunctiva pink. Ears:  Normal auditory acuity. Neck:  Supple; no masses or thyromegaly. Lungs:  Respirations even and unlabored.  Clear throughout to auscultation.   No wheezes, crackles, or rhonchi. No acute distress. Heart:  Regular rate and rhythm; no murmurs, clicks, rubs, or gallops. Abdomen:  Normal bowel sounds.  No bruits.  Soft, non-tender and non-distended without masses, hepatosplenomegaly or hernias noted.  No guarding or rebound tenderness.  Negative Carnett sign.   Rectal:  Deferred.  Pulses:  Normal pulses noted. Extremities:  No clubbing or edema.  No cyanosis. Neurologic:  Alert and oriented x3;  grossly normal neurologically. Skin:  Intact without significant lesions or rashes.  No jaundice. Lymph Nodes:  No significant cervical adenopathy. Psych:  Alert and cooperative. Normal mood and affect.  Imaging Studies: No results found.  Assessment and Plan:   Alyssa Kemp is a 30 y.o. 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  The patient has been told that the antibiotics can destroy her normal gut flora and it can be replaced by bacteriostatic or more prone to cause her symptoms of irritable bowel syndrome.  The patient has been told to start a fiber supplementation and to try probiotics to see if that helps her symptoms.  Due to the absence of worse symptoms such as rectal bleeding the diarrhea waking her up from a sleep weight loss or family history of colon cancer, there is no need to do an invasive workup with any endoscopic procedures at this time.  The patient has been told that if her symptoms should increase or change she should contact me.  The patient has been explained the plan and agrees with it.     26, MD. Midge Minium    Note: This  dictation was prepared with Dragon dictation along with smaller phrase technology. Any transcriptional errors that result from this process are unintentional.

## 2021-03-13 LAB — CYTOLOGY - PAP: Diagnosis: NEGATIVE

## 2021-03-13 NOTE — Progress Notes (Signed)
Hi Alyssa Kemp.  Your PAP was negative.  Repeat it in 3 years.

## 2021-03-18 NOTE — Progress Notes (Signed)
BP 126/74   Pulse 84   Ht $R'5\' 4"'kx$  (1.626 m)   Wt 173 lb (78.5 kg)   BMI 29.70 kg/m    Subjective:    [name], female    DOB: 1991/05/25, 30 y.o.   MRN: [id number]  HPI: Alyssa Kemp is a 30 y.o. female  Chief Complaint  Patient presents with   Rash   RASH- patient states she got the HPV vaccine.  States she got an ulcer on the back of her throat the next day.  States she has hives under both breasts and on her trunk area.  She isn't sure what is causing the rash.  She has tried zyrtec and steroid cream but it is not improving. States she hasn't changed any lotions, soaps, and detergents. Denies SOB, throat closing, or pain.   Relevant past medical, surgical, family and social history reviewed and updated as indicated. Interim medical history since our last visit reviewed. Allergies and medications reviewed and updated.  Review of Systems  HENT:  Negative for sore throat.   Respiratory:  Negative for shortness of breath.   Skin:  Positive for rash.   Per HPI unless specifically indicated above     Objective:    BP 126/74   Pulse 84   Ht $R'5\' 4"'kD$  (1.626 m)   Wt 173 lb (78.5 kg)   BMI 29.70 kg/m   Wt Readings from Last 3 Encounters:  03/19/21 173 lb (78.5 kg)  03/12/21 173 lb (78.5 kg)  03/06/21 171 lb 12.8 oz (77.9 kg)    Physical Exam Vitals and nursing note reviewed.  Constitutional:      General: She is not in acute distress.    Appearance: Normal appearance. She is normal weight. She is not ill-appearing, toxic-appearing or diaphoretic.  HENT:     Head: Normocephalic.     Right Ear: External ear normal.     Left Ear: External ear normal.     Nose: Nose normal.     Mouth/Throat:     Mouth: Mucous membranes are moist.     Pharynx: Oropharynx is clear.  Eyes:     General:        Right eye: No discharge.        Left eye: No discharge.     Extraocular Movements: Extraocular movements intact.     Conjunctiva/sclera: Conjunctivae normal.      Pupils: Pupils are equal, round, and reactive to light.  Cardiovascular:     Rate and Rhythm: Normal rate and regular rhythm.     Heart sounds: No murmur heard. Pulmonary:     Effort: Pulmonary effort is normal. No respiratory distress.     Breath sounds: Normal breath sounds. No wheezing or rales.  Musculoskeletal:     Cervical back: Normal range of motion and neck supple.  Skin:    General: Skin is warm and dry.     Capillary Refill: Capillary refill takes less than 2 seconds.       Neurological:     General: No focal deficit present.     Mental Status: She is alert and oriented to person, place, and time. Mental status is at baseline.  Psychiatric:        Mood and Affect: Mood normal.        Behavior: Behavior normal.        Thought Content: Thought content normal.        Judgment: Judgment normal.    Results  for orders placed or performed in visit on 03/06/21  Microscopic Examination   Urine  Result Value Ref Range   WBC, UA None seen 0 - 5 /hpf   RBC 0-2 0 - 2 /hpf   Epithelial Cells (non renal) 0-10 0 - 10 /hpf   Bacteria, UA None seen None seen/Few  CBC with Differential/Platelet  Result Value Ref Range   WBC 8.9 3.4 - 10.8 x10E3/uL   RBC 4.70 3.77 - 5.28 x10E6/uL   Hemoglobin 14.5 11.1 - 15.9 g/dL   Hematocrit 40.9 34.0 - 46.6 %   MCV 87 79 - 97 fL   MCH 30.9 26.6 - 33.0 pg   MCHC 35.5 31.5 - 35.7 g/dL   RDW 13.1 11.7 - 15.4 %   Platelets 303 150 - 450 x10E3/uL   Neutrophils 58 Not Estab. %   Lymphs 28 Not Estab. %   Monocytes 8 Not Estab. %   Eos 5 Not Estab. %   Basos 1 Not Estab. %   Neutrophils Absolute 5.1 1.4 - 7.0 x10E3/uL   Lymphocytes Absolute 2.5 0.7 - 3.1 x10E3/uL   Monocytes Absolute 0.7 0.1 - 0.9 x10E3/uL   EOS (ABSOLUTE) 0.5 (H) 0.0 - 0.4 x10E3/uL   Basophils Absolute 0.1 0.0 - 0.2 x10E3/uL   Immature Granulocytes 0 Not Estab. %   Immature Grans (Abs) 0.0 0.0 - 0.1 x10E3/uL  Comprehensive metabolic panel  Result Value Ref Range    Glucose 94 65 - 99 mg/dL   BUN 11 6 - 20 mg/dL   Creatinine, Ser 0.79 0.57 - 1.00 mg/dL   eGFR 104 >59 mL/min/1.73   BUN/Creatinine Ratio 14 9 - 23   Sodium 140 134 - 144 mmol/L   Potassium 4.6 3.5 - 5.2 mmol/L   Chloride 104 96 - 106 mmol/L   CO2 22 20 - 29 mmol/L   Calcium 10.0 8.7 - 10.2 mg/dL   Total Protein 7.1 6.0 - 8.5 g/dL   Albumin 4.8 3.9 - 5.0 g/dL   Globulin, Total 2.3 1.5 - 4.5 g/dL   Albumin/Globulin Ratio 2.1 1.2 - 2.2   Bilirubin Total 0.6 0.0 - 1.2 mg/dL   Alkaline Phosphatase 54 44 - 121 IU/L   AST 23 0 - 40 IU/L   ALT 19 0 - 32 IU/L  Lipid panel  Result Value Ref Range   Cholesterol, Total 224 (H) 100 - 199 mg/dL   Triglycerides 143 0 - 149 mg/dL   HDL 60 >39 mg/dL   VLDL Cholesterol Cal 25 5 - 40 mg/dL   LDL Chol Calc (NIH) 139 (H) 0 - 99 mg/dL   Chol/HDL Ratio 3.7 0.0 - 4.4 ratio  TSH  Result Value Ref Range   TSH 1.340 0.450 - 4.500 uIU/mL  Urinalysis, Routine w reflex microscopic  Result Value Ref Range   Specific Gravity, UA 1.015 1.005 - 1.030   pH, UA 6.0 5.0 - 7.5   Color, UA Yellow Yellow   Appearance Ur Clear Clear   Leukocytes,UA Negative Negative   Protein,UA Negative Negative/Trace   Glucose, UA Negative Negative   Ketones, UA Negative Negative   RBC, UA Trace (A) Negative   Bilirubin, UA Negative Negative   Urobilinogen, Ur 0.2 0.2 - 1.0 mg/dL   Nitrite, UA Negative Negative   Microscopic Examination See below:   Cytology - PAP  Result Value Ref Range   Adequacy      Satisfactory for evaluation; transformation zone component PRESENT.   Diagnosis      -  Negative for intraepithelial lesion or malignancy (NILM)      Assessment & Plan:   Problem List Items Addressed This Visit   None Visit Diagnoses     Allergic contact dermatitis, unspecified trigger    -  Primary   Complete course of steroids. Continue with benadryl. Return to clinic if symptoms worsen.        Follow up plan: Return if symptoms worsen or fail to  improve.

## 2021-03-19 ENCOUNTER — Ambulatory Visit (INDEPENDENT_AMBULATORY_CARE_PROVIDER_SITE_OTHER): Payer: Medicaid Other | Admitting: Nurse Practitioner

## 2021-03-19 ENCOUNTER — Other Ambulatory Visit: Payer: Self-pay

## 2021-03-19 ENCOUNTER — Encounter: Payer: Self-pay | Admitting: Nurse Practitioner

## 2021-03-19 VITALS — BP 126/74 | HR 84 | Ht 64.0 in | Wt 173.0 lb

## 2021-03-19 DIAGNOSIS — L239 Allergic contact dermatitis, unspecified cause: Secondary | ICD-10-CM | POA: Diagnosis not present

## 2021-03-19 DIAGNOSIS — F338 Other recurrent depressive disorders: Secondary | ICD-10-CM | POA: Diagnosis not present

## 2021-03-19 MED ORDER — PREDNISONE 10 MG PO TABS
10.0000 mg | ORAL_TABLET | Freq: Every day | ORAL | 0 refills | Status: DC
Start: 2021-03-19 — End: 2021-06-17

## 2021-03-26 DIAGNOSIS — F338 Other recurrent depressive disorders: Secondary | ICD-10-CM | POA: Diagnosis not present

## 2021-03-27 DIAGNOSIS — F338 Other recurrent depressive disorders: Secondary | ICD-10-CM | POA: Diagnosis not present

## 2021-04-02 DIAGNOSIS — F338 Other recurrent depressive disorders: Secondary | ICD-10-CM | POA: Diagnosis not present

## 2021-04-09 DIAGNOSIS — F338 Other recurrent depressive disorders: Secondary | ICD-10-CM | POA: Diagnosis not present

## 2021-04-15 DIAGNOSIS — F338 Other recurrent depressive disorders: Secondary | ICD-10-CM | POA: Diagnosis not present

## 2021-04-29 DIAGNOSIS — F338 Other recurrent depressive disorders: Secondary | ICD-10-CM | POA: Diagnosis not present

## 2021-05-07 DIAGNOSIS — F338 Other recurrent depressive disorders: Secondary | ICD-10-CM | POA: Diagnosis not present

## 2021-05-13 ENCOUNTER — Other Ambulatory Visit: Payer: Self-pay | Admitting: Nurse Practitioner

## 2021-05-13 NOTE — Telephone Encounter (Signed)
Requested Prescriptions  Pending Prescriptions Disp Refills  . cetirizine (ZYRTEC) 10 MG tablet [Pharmacy Med Name: CETIRIZINE 10MG  TABLETS] 30 tablet 0    Sig: TAKE 1 TABLET(10 MG) BY MOUTH DAILY     Ear, Nose, and Throat:  Antihistamines Passed - 05/13/2021  9:36 AM      Passed - Valid encounter within last 12 months    Recent Outpatient Visits          1 month ago Allergic contact dermatitis, unspecified trigger   [facility], NP   2 months ago Annual physical exam   [facility], NP   6 months ago Diarrhea, unspecified type   [facility], NP   7 months ago Acute cystitis without hematuria   [facility], NP   10 months ago Hives   [facility], [place], DO

## 2021-05-14 NOTE — Telephone Encounter (Signed)
Verified with pharmacy this order was received yesterday. This is a duplicate and not needed.

## 2021-05-20 ENCOUNTER — Encounter: Payer: Medicaid Other | Admitting: Internal Medicine

## 2021-05-21 DIAGNOSIS — F338 Other recurrent depressive disorders: Secondary | ICD-10-CM | POA: Diagnosis not present

## 2021-05-29 ENCOUNTER — Encounter: Payer: Medicaid Other | Admitting: Internal Medicine

## 2021-06-13 ENCOUNTER — Emergency Department (HOSPITAL_COMMUNITY)
Admission: EM | Admit: 2021-06-13 | Discharge: 2021-06-13 | Disposition: A | Discharge status: Home / Self Care | Payer: Medicaid Other | Attending: Emergency Medicine | Admitting: Emergency Medicine

## 2021-06-13 ENCOUNTER — Encounter (HOSPITAL_COMMUNITY): Payer: Self-pay

## 2021-06-13 ENCOUNTER — Other Ambulatory Visit: Payer: Self-pay

## 2021-06-13 DIAGNOSIS — Z79899 Other long term (current) drug therapy: Secondary | ICD-10-CM | POA: Diagnosis not present

## 2021-06-13 DIAGNOSIS — Z8616 Personal history of COVID-19: Secondary | ICD-10-CM | POA: Diagnosis not present

## 2021-06-13 DIAGNOSIS — R197 Diarrhea, unspecified: Secondary | ICD-10-CM | POA: Diagnosis not present

## 2021-06-13 DIAGNOSIS — R112 Nausea with vomiting, unspecified: Secondary | ICD-10-CM | POA: Diagnosis not present

## 2021-06-13 DIAGNOSIS — R1084 Generalized abdominal pain: Secondary | ICD-10-CM | POA: Insufficient documentation

## 2021-06-13 DIAGNOSIS — K219 Gastro-esophageal reflux disease without esophagitis: Secondary | ICD-10-CM | POA: Diagnosis not present

## 2021-06-13 LAB — URINALYSIS, ROUTINE W REFLEX MICROSCOPIC
Bilirubin Urine: NEGATIVE
Glucose, UA: NEGATIVE mg/dL
Ketones, ur: NEGATIVE mg/dL
Leukocytes,Ua: NEGATIVE
Nitrite: NEGATIVE
Specific Gravity, Urine: >1.03 — ABNORMAL HIGH (ref 1.005–1.030)
pH: 6 (ref 5.0–8.0)

## 2021-06-13 LAB — CBC
HCT: 36.8 % (ref 36.0–46.0)
Hemoglobin: 13.6 g/dL (ref 12.0–15.0)
MCH: 31.3 pg (ref 26.0–34.0)
MCHC: 37 g/dL — ABNORMAL HIGH (ref 30.0–36.0)
MCV: 84.8 fL (ref 80.0–100.0)
Platelets: 233 10*3/uL (ref 150–400)
RBC: 4.34 MIL/uL (ref 3.87–5.11)
RDW: 12.8 % (ref 11.5–15.5)
WBC: 4.9 10*3/uL (ref 4.0–10.5)
nRBC: 0 % (ref 0.0–0.2)

## 2021-06-13 LAB — URINALYSIS, MICROSCOPIC (REFLEX)

## 2021-06-13 LAB — COMPREHENSIVE METABOLIC PANEL
ALT: 20 U/L (ref 0–44)
AST: 22 U/L (ref 15–41)
Albumin: 4.2 g/dL (ref 3.5–5.0)
Alkaline Phosphatase: 43 U/L (ref 38–126)
Anion gap: 6 (ref 5–15)
BUN: 16 mg/dL (ref 6–20)
CO2: 24 mmol/L (ref 22–32)
Calcium: 9 mg/dL (ref 8.9–10.3)
Chloride: 109 mmol/L (ref 98–111)
Creatinine, Ser: 0.75 mg/dL (ref 0.44–1.00)
GFR, Estimated: >60 mL/min (ref >60)
Glucose, Bld: 108 mg/dL — ABNORMAL HIGH (ref 70–99)
Potassium: 3.9 mmol/L (ref 3.5–5.1)
Sodium: 139 mmol/L (ref 135–145)
Total Bilirubin: 0.9 mg/dL (ref 0.3–1.2)
Total Protein: 7.2 g/dL (ref 6.5–8.1)

## 2021-06-13 LAB — LIPASE, BLOOD: Lipase: 25 U/L (ref 11–51)

## 2021-06-13 LAB — I-STAT BETA HCG BLOOD, ED (MC, WL, AP ONLY): I-stat hCG, quantitative: <5 m[IU]/mL (ref <5)

## 2021-06-13 MED ORDER — ONDANSETRON HCL 4 MG PO TABS: 4.0000 mg | ORAL_TABLET | Freq: Four times a day (QID) | ORAL | 0 refills | Status: DC

## 2021-06-13 MED ORDER — SODIUM CHLORIDE 0.9 % IV BOLUS
1000.0000 mL | Freq: Once | INTRAVENOUS | Status: AC
  Administered 2021-06-13: 08:00:00 1000 mL via INTRAVENOUS

## 2021-06-13 MED ORDER — ONDANSETRON HCL 4 MG/2ML IJ SOLN
4.0000 mg | Freq: Once | INTRAMUSCULAR | Status: AC
  Administered 2021-06-13: 08:00:00 4 mg via INTRAVENOUS
  Filled 2021-06-13: qty 2

## 2021-06-13 NOTE — ED Triage Notes (Signed)
Patient c/o generalized abdominal pain and emesis since yesterday. Patient states she also had diarrhea, but resolved with Imodium.

## 2021-06-13 NOTE — Discharge Instructions (Addendum)
Feel that your symptoms are likely due to gastroenteritis.  This is almost always a viral condition, and therefore antibiotics are not indicated.  Manage with supportive care including plenty of oral hydration.  I have given you a prescription for Zofran which should help with nausea and vomiting.  Return if development of any new or worsening symptoms.

## 2021-06-13 NOTE — ED Provider Notes (Signed)
[facility]-EMERGENCY DEPT Provider Note   CSN: [id number] Arrival date & time: 06/13/21  2778     History Chief Complaint  Patient presents with   Emesis   Abdominal Pain    Alyssa Kemp is a 30 y.o. female.  Patient with history of GERD and IBS presents today with chief complaint of nausea and vomiting.  She states that same has been ongoing since yesterday around 3 PM after she ate [employer].  States that she initially had diarrhea as well, however she states that yesterday afternoon she took Imodium which relieved her diarrhea.  She attempted several medications for relief of symptoms, however states she was unable to hold any of them down due to persistent vomiting.  She states that her vomit is nonbloody and nonbilious.  She denies any fevers or chills or known sick contacts. Also endorses generalized abdominal pain today which she attributes to muscular pain from vomiting all night.  The history is provided by the patient. No language interpreter was used.  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   Scoliosis     Patient Active Problem List   Diagnosis Date Noted   History of 2019 novel coronavirus disease (COVID-19) 07/26/2020   Allergic rhinitis 09/08/2018   Obesity (BMI 30.0-34.9) 03/31/2018   Bipolar 1 disorder (HCC) 09/05/2016   Rh negative state in antepartum  period 09/05/2016   GERD (gastroesophageal reflux disease) 08/28/2016   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   Anxiety, generalized 11/05/2015   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/05/2015   History of chlamydia infection 11/05/2015   History of abnormal Pap smear 02/21/2013    Past Surgical History:  Procedure Laterality Date   ADENOIDECTOMY     COLPOSCOPY  01/15/2016   CIN 1   CYSTOSCOPY W/ RETROGRADES Bilateral 12/28/2017   Procedure: CYSTOSCOPY WITH RETROGRADE PYELOGRAM;  Surgeon: [name], MD;  Location: [facility] ORS;  Service: Urology;  Laterality: Bilateral;   TONSILLECTOMY     TUBAL LIGATION Bilateral 09/19/2016   Procedure: POST PARTUM TUBAL LIGATION;  Surgeon: [name], MD;  Location: [facility] ORS;  Service: Gynecology;  Laterality: Bilateral;   WISDOM TOOTH EXTRACTION       OB History     Gravida  3   Para  3   Term  3   Preterm      AB      Living  2      SAB      IAB      Ectopic      Multiple      Live Births  2           Family History  Problem Relation Age of Onset   Cervical cancer Mother    Ovarian cancer Mother    Hypertension Maternal Grandmother    Pancreatic cancer Maternal Grandmother    Diabetes Maternal  Grandmother    Coronary artery disease Maternal Grandfather    Congestive Heart Failure Maternal Grandfather    Hypertension Maternal Grandfather    Prostate cancer Maternal Grandfather    Diabetes Maternal Grandfather    Hypertension Paternal Grandmother    Diabetes Paternal Grandmother    Heart disease Paternal Grandfather    Hypertension Paternal Grandfather    Breast cancer Paternal Aunt     Social History   Tobacco Use   Smoking status: Never   Smokeless tobacco: Never  Vaping Use   Vaping Use: Never used  Substance Use Topics   Alcohol use: No   Drug use: No    Home Medications Prior to Admission medications   Medication Sig Start Date End Date Taking? Authorizing  Provider  cetirizine (ZYRTEC) 10 MG tablet TAKE 1 TABLET(10 MG) BY MOUTH DAILY 05/13/21   Larae Grooms, NP  EPINEPHrine (EPIPEN 2-PAK) 0.3 mg/0.3 mL IJ SOAJ injection Inject 0.3 mLs (0.3 mg total) into the muscle as needed for anaphylaxis. 12/15/19   Particia Nearing, PA-C  Multiple Vitamin (MULTIVITAMINS PO) Take by mouth daily.    [provider]  omeprazole (PRILOSEC) 20 MG capsule Take 1 capsule (20 mg total) by mouth daily. 03/06/21   Larae Grooms, NP  predniSONE (DELTASONE) 10 MG tablet Take 1 tablet (10 mg total) by mouth daily with breakfast. Take 6 today, 5 tomorrow, and decrease by 1 each day until the course is complete. 03/19/21   Larae Grooms, NP  VYVANSE 20 MG capsule Take 20 mg by mouth every morning. 05/31/20   [provider]    Allergies    Sulfacetamide sodium, Sulfasalazine, Tussionex pennkinetic er [hydrocod polst-cpm polst er], Doxycycline, Macrobid [nitrofurantoin], and Amoxicillin-pot clavulanate  Review of Systems   Review of Systems  Constitutional:  Negative for chills and fever.  Gastrointestinal:  Positive for abdominal pain, diarrhea (now resolved), nausea and vomiting. Negative for abdominal distention, anal bleeding, blood in stool and constipation.  Genitourinary:  Negative for dysuria and flank pain.  Musculoskeletal:  Negative for neck pain and neck stiffness.  Skin:  Negative for rash.  Neurological:  Negative for headaches.  Psychiatric/Behavioral:  Negative for decreased concentration and dysphoric mood.   All other systems reviewed and are negative.  Physical Exam Updated Vital Signs BP 111/75 (BP Location: Left Arm)    Pulse 81    Temp 98.4 F (36.9 C) (Oral)    Resp 16    Ht  (1.626 m)    Wt 76.7 kg    LMP 05/20/2021 (Approximate)    SpO2 99%    BMI 29.01 kg/m   Physical Exam Vitals and nursing note reviewed.  Constitutional:      General: She is not in acute distress.    Appearance: She is  well-developed and normal weight. She is not ill-appearing, toxic-appearing or diaphoretic.  HENT:     Head: Normocephalic and atraumatic.     Mouth/Throat:     Mouth: Mucous membranes are moist.  Eyes:     Extraocular Movements: Extraocular movements intact.  Cardiovascular:     Rate and Rhythm: Normal rate and regular rhythm.     Heart sounds: Normal heart sounds.  Pulmonary:     Effort: Pulmonary effort is normal. No respiratory distress.     Breath sounds: Normal breath sounds. No stridor. No wheezing, rhonchi or rales.  Abdominal:     General: Abdomen is flat. Bowel sounds are normal.     Palpations: Abdomen is  soft.     Tenderness: There is generalized abdominal tenderness. There is no right CVA tenderness, left CVA tenderness, guarding or rebound. Negative signs include Murphy's sign, Rovsing's sign, McBurney's sign, psoas sign and obturator sign.  Skin:    General: Skin is warm and dry.  Neurological:     General: No focal deficit present.     Mental Status: She is alert.  Psychiatric:        Mood and Affect: Mood normal.        Behavior: Behavior normal.    ED Results / Procedures / Treatments   Labs (all labs ordered are listed, but only abnormal results are displayed) Labs Reviewed  COMPREHENSIVE METABOLIC PANEL - Abnormal; Notable for the following components:      Result Value   Glucose, Bld 108 (*)    All other components within normal limits  CBC - Abnormal; Notable for the following components:   MCHC 37.0 (*)    All other components within normal limits  LIPASE, BLOOD  URINALYSIS, ROUTINE W REFLEX MICROSCOPIC  I-STAT BETA HCG BLOOD, ED ([facility], [facility], [facility] ONLY)    EKG None  Radiology No results found.  Procedures Procedures   Medications Ordered in ED Medications  sodium chloride 0.9 % bolus 1,000 mL (1,000 mLs Intravenous New Bag/Given 06/13/21 0819)  ondansetron (ZOFRAN) injection 4 mg (4 mg Intravenous Given 06/13/21 3976)    ED Course  I have  reviewed the triage vital signs and the nursing notes.  Pertinent labs & imaging results that were available during my care of the patient were reviewed by me and considered in my medical decision making (see chart for details).    MDM Rules/Calculators/A&P                         Patient presents today with nausea, vomiting, and diarrhea after eating [employer] yesterday. Took Imodium which resolved diarrhea but has persistent nausea/vomiting. Some mild upper abdominal tenderness without peritoneal signs likely due to soreness from persistent vomiting. Will get labs, give Zofran and fluids and reassess.  Labs without leukocytosis or anemia. No electrolyte abnormalities, elevation in liver enzymes, or changes in kidney function. Lipase normal, hcg negative.  After administration of Zofran and fluid bolus, patient states she is feeling significantly better. She has not had any episodes of vomiting since presentation.  She is able to drink water without vomiting.   Patient is nontoxic, nonseptic appearing, in no apparent distress.  Patient's pain and other symptoms adequately managed in emergency department.  Fluid bolus given.  Labs and vitals reviewed.  Patient does not meet the SIRS or Sepsis criteria.  On repeat exam patient does not have a surgical abdomin and there are no peritoneal signs.  No indication of appendicitis, bowel obstruction, bowel perforation, cholecystitis, diverticulitis, PID or ectopic pregnancy.  Patient discharged home with symptomatic treatment and given strict instructions for follow-up with their primary care physician.  I have also discussed reasons to return immediately to the ER.  Patient expresses understanding and agrees with plan.  Discharged in stable condition.    Final Clinical Impression(s) / ED Diagnoses Final diagnoses:  Nausea vomiting and diarrhea    Rx / DC Orders ED Discharge Orders          Ordered    ondansetron (ZOFRAN) 4 MG tablet  Every 6 hours         06/13/21 1021  An After Visit Summary was printed and given to the patient.    Vear Clock 06/13/21 1023    Bethann Berkshire, MD 06/15/21 0830

## 2021-06-14 ENCOUNTER — Telehealth: Payer: Self-pay

## 2021-06-14 NOTE — Telephone Encounter (Signed)
Transition Care Management Unsuccessful Follow-up Telephone Call ° °Date of discharge and from where:  06/13/2021-[facility] ° °Attempts:  1st Attempt ° °Reason for unsuccessful TCM follow-up call:  Left voice message ° °  °

## 2021-06-17 ENCOUNTER — Telehealth (INDEPENDENT_AMBULATORY_CARE_PROVIDER_SITE_OTHER): Payer: Medicaid Other | Admitting: Nurse Practitioner

## 2021-06-17 ENCOUNTER — Ambulatory Visit: Payer: Self-pay

## 2021-06-17 ENCOUNTER — Encounter: Payer: Self-pay | Admitting: Nurse Practitioner

## 2021-06-17 DIAGNOSIS — A084 Viral intestinal infection, unspecified: Secondary | ICD-10-CM

## 2021-06-17 DIAGNOSIS — R110 Nausea: Secondary | ICD-10-CM

## 2021-06-17 MED ORDER — ONDANSETRON HCL 4 MG PO TABS: 4.0000 mg | ORAL_TABLET | Freq: Four times a day (QID) | ORAL | 0 refills | Status: DC

## 2021-06-17 NOTE — Progress Notes (Signed)
LMP 05/20/2021 (Approximate)    Subjective:    [name], female    DOB: 09-Dec-1990, 30 y.o.   MRN: [id number]  HPI: Alyssa Kemp is a 30 y.o. female  Chief Complaint  Patient presents with   Nausea    Pt states she started having nausea and vomiting at the end of last week. States she went to the ER for it 06/13/21 and given Zofran. States she took the last of that prescription this morning and is requesting a refill as she states that her stomach is not quite back to normal.    Pt states she started having nausea and vomiting at the end of last week. She was having diarrhea and vomiting last Thursday.  She wasn't able to stop the vomiting.  States she went to the ER for it 06/13/21 and given Zofran. States she took the last of that prescription this morning and is requesting a refill as she states that her stomach is not quite back to normal. She has improved but it isn't 100% back to normal.  She is eating and drinking again but wants to make sure she has some zofran on hand.   Relevant past medical, surgical, family and social history reviewed and updated as indicated. Interim medical history since our last visit reviewed. Allergies and medications reviewed and updated.  Review of Systems  Gastrointestinal:  Positive for abdominal pain. Negative for diarrhea and vomiting.   Per HPI unless specifically indicated above     Objective:    LMP 05/20/2021 (Approximate)   Wt Readings from Last 3 Encounters:  06/13/21 169 lb (76.7 kg)  03/19/21 173 lb (78.5 kg)  03/12/21 173 lb (78.5 kg)    Physical Exam Vitals and nursing note reviewed.  HENT:     Head: Normocephalic.     Right Ear: Hearing normal.     Left Ear: Hearing normal.     Nose: Nose normal.  Eyes:     Pupils: Pupils are equal, round, and reactive to light.  Pulmonary:     Effort: Pulmonary effort is normal. No respiratory distress.  Neurological:     Mental Status: She is alert.   Psychiatric:        Mood and Affect: Mood normal.        Behavior: Behavior normal.        Thought Content: Thought content normal.        Judgment: Judgment normal.    Results for orders placed or performed during the hospital encounter of 06/13/21  Lipase, blood  Result Value Ref Range   Lipase 25 11 - 51 U/L  Comprehensive metabolic panel  Result Value Ref Range   Sodium 139 135 - 145 mmol/L   Potassium 3.9 3.5 - 5.1 mmol/L   Chloride 109 98 - 111 mmol/L   CO2 24 22 - 32 mmol/L   Glucose, Bld 108 (H) 70 - 99 mg/dL   BUN 16 6 - 20 mg/dL   Creatinine, Ser 7.49 0.44 - 1.00 mg/dL   Calcium 9.0 8.9 - 44.9 mg/dL   Total Protein 7.2 6.5 - 8.1 g/dL   Albumin 4.2 3.5 - 5.0 g/dL   AST 22 15 - 41 U/L   ALT 20 0 - 44 U/L   Alkaline Phosphatase 43 38 - 126 U/L   Total Bilirubin 0.9 0.3 - 1.2 mg/dL   GFR, Estimated >67 >59 mL/min   Anion gap 6 5 - 15  CBC  Result  Value Ref Range   WBC 4.9 4.0 - 10.5 K/uL   RBC 4.34 3.87 - 5.11 MIL/uL   Hemoglobin 13.6 12.0 - 15.0 g/dL   HCT 40.1 02.7 - 25.3 %   MCV 84.8 80.0 - 100.0 fL   MCH 31.3 26.0 - 34.0 pg   MCHC 37.0 (H) 30.0 - 36.0 g/dL   RDW 66.4 40.3 - 47.4 %   Platelets 233 150 - 400 K/uL   nRBC 0.0 0.0 - 0.2 %  Urinalysis, Routine w reflex microscopic Urine, Clean Catch  Result Value Ref Range   Color, Urine YELLOW YELLOW   APPearance CLOUDY (A) CLEAR   Specific Gravity, Urine >1.030 (H) 1.005 - 1.030   pH 6.0 5.0 - 8.0   Glucose, UA NEGATIVE NEGATIVE mg/dL   Hgb urine dipstick MODERATE (A) NEGATIVE   Bilirubin Urine NEGATIVE NEGATIVE   Ketones, ur NEGATIVE NEGATIVE mg/dL   Protein, ur TRACE (A) NEGATIVE mg/dL   Nitrite NEGATIVE NEGATIVE   Leukocytes,Ua NEGATIVE NEGATIVE  Urinalysis, Microscopic (reflex)  Result Value Ref Range   RBC / HPF 6-10 0 - 5 RBC/hpf   WBC, UA 0-5 0 - 5 WBC/hpf   Bacteria, UA FEW (A) NONE SEEN   Squamous Epithelial / LPF 6-10 0 - 5   Urine-Other MUCOUS PRESENT   I-Stat beta hCG blood, ED  Result  Value Ref Range   I-stat hCG, quantitative <5.0 <5 mIU/mL   Comment 3              Assessment & Plan:   Problem List Items Addressed This Visit   None Visit Diagnoses     Viral gastroenteritis    -  Primary   Improving. Will refill Zofran for patient during visit. Discussed signs/ symptoms to monitor for and when to seek higher level of care. Recommend hydrating well   Nausea            Follow up plan: Return if symptoms worsen or fail to improve.   This visit was completed via MyChart due to the restrictions of the COVID-19 pandemic. All issues as above were discussed and addressed. Physical exam was done as above through visual confirmation on MyChart. If it was felt that the patient should be evaluated in the office, they were directed there. The patient verbally consented to this visit. Location of the patient: Home Location of the provider: Office Those involved with this call:  Provider: Larae Grooms, NP CMA: Wilhemena Durie, CMA Front Desk/Registration: Channing Mutters This encounter was conducted via video.  I spent 15 dedicated to the care of this patient on the date of this encounter to include previsit review of 20, face to face time with the patient, and post visit ordering of testing.

## 2021-06-17 NOTE — Telephone Encounter (Signed)
°  Chief Complaint: Nausea Symptoms: Nausea Frequency: Started 06/13/21. Seen in ED with nausea,vomiting. Pertinent Negatives: Patient denies vomiting Disposition: [] ED /[] Urgent Care (no appt availability in office) / [] Appointment(In office/virtual)/ []  [facility]/ [] Home Care/ [] Refused Recommended Disposition  Additional Notes: Given Zofran prescription in ED. Took last pill this morning. Asking for a refill. Please advise pt.    Answer Assessment - Initial Assessment Questions 1. NAUSEA SEVERITY: "How bad is the nausea?" (e.g., mild, moderate, severe; dehydration, weight loss)   - MILD: loss of appetite without change in eating habits   - MODERATE: decreased oral intake without significant weight loss, dehydration, or malnutrition   - SEVERE: inadequate caloric or fluid intake, significant weight loss, symptoms of dehydration     Moderate 2. ONSET: "When did the nausea begin?"     06/13/21 3. VOMITING: "Any vomiting?" If Yes, ask: "How many times today?"     No 4. RECURRENT SYMPTOM: "Have you had nausea before?" If Yes, ask: "When was the last time?" "What happened that time?"     Yes 5. CAUSE: "What do you think is causing the nausea?"     Viral - seen in ED 6. PREGNANCY: "Is there any chance you are pregnant?" (e.g., unprotected intercourse, missed birth control pill, broken condom)     No  Protocols used: Nausea-A-AH

## 2021-06-17 NOTE — Telephone Encounter (Signed)
Pt is scheduled virtually with Clydie Braun

## 2021-06-17 NOTE — Telephone Encounter (Signed)
Visit with PCP today.

## 2021-06-26 ENCOUNTER — Emergency Department (HOSPITAL_COMMUNITY): Admission: EM | Admit: 2021-06-26 | Payer: Medicaid Other | Source: Home / Self Care

## 2021-06-26 NOTE — Progress Notes (Signed)
BP 118/62    Pulse 69    Temp 98.2 F (36.8 C) (Oral)    Wt 167 lb 9.6 oz (76 kg)    SpO2 98%    BMI 28.77 kg/m    Subjective:    [name], female    DOB: 1990/10/29, 31 y.o.   MRN: [id number]  HPI: Alyssa Kemp is a 31 y.o. female  Chief Complaint  Patient presents with   Urinary Tract Infection    Pt states she has been having frequent urination, lower abdominal pain, pressure with urination, and burning with urination since Sunday    URINARY SYMPTOMS Dysuria: yes Urinary frequency: yes Urgency: yes Small volume voids: yes Symptom severity: no Urinary incontinence: no Foul odor: no Hematuria: no Abdominal pain: yes Back pain: no Suprapubic pain/pressure: yes Flank pain: no Fever:  no Vomiting: yes Relief with cranberry juice: yes Relief with pyridium: yes Status: worse Previous urinary tract infection: yes Recurrent urinary tract infection: no  Relevant past medical, surgical, family and social history reviewed and updated as indicated. Interim medical history since our last visit reviewed. Allergies and medications reviewed and updated.  Review of Systems  Constitutional:  Negative for fever.  Gastrointestinal:  Negative for abdominal pain and vomiting.  Genitourinary:  Positive for dysuria, frequency and urgency. Negative for decreased urine volume, flank pain and hematuria.  Musculoskeletal:  Negative for back pain.   Per HPI unless specifically indicated above     Objective:    BP 118/62    Pulse 69    Temp 98.2 F (36.8 C) (Oral)    Wt 167 lb 9.6 oz (76 kg)    SpO2 98%    BMI 28.77 kg/m   Wt Readings from Last 3 Encounters:  06/27/21 167 lb 9.6 oz (76 kg)  06/13/21 169 lb (76.7 kg)  03/19/21 173 lb (78.5 kg)    Physical Exam Vitals and nursing note reviewed.  Constitutional:      General: She is not in acute distress.    Appearance: Normal appearance. She is normal weight. She is not ill-appearing, toxic-appearing or  diaphoretic.  HENT:     Head: Normocephalic.     Right Ear: External ear normal.     Left Ear: External ear normal.     Nose: Nose normal.     Mouth/Throat:     Mouth: Mucous membranes are moist.     Pharynx: Oropharynx is clear.  Eyes:     General:        Right eye: No discharge.        Left eye: No discharge.     Extraocular Movements: Extraocular movements intact.     Conjunctiva/sclera: Conjunctivae normal.     Pupils: Pupils are equal, round, and reactive to light.  Cardiovascular:     Rate and Rhythm: Normal rate and regular rhythm.     Heart sounds: No murmur heard. Pulmonary:     Effort: Pulmonary effort is normal. No respiratory distress.     Breath sounds: Normal breath sounds. No wheezing or rales.  Abdominal:     General: Abdomen is flat. Bowel sounds are normal. There is no distension.     Palpations: Abdomen is soft.     Tenderness: There is abdominal tenderness. There is no right CVA tenderness, left CVA tenderness or guarding.  Musculoskeletal:     Cervical back: Normal range of motion and neck supple.  Skin:    General: Skin is warm and  dry.     Capillary Refill: Capillary refill takes less than 2 seconds.  Neurological:     General: No focal deficit present.     Mental Status: She is alert and oriented to person, place, and time. Mental status is at baseline.  Psychiatric:        Mood and Affect: Mood normal.        Behavior: Behavior normal.        Thought Content: Thought content normal.        Judgment: Judgment normal.    Results for orders placed or performed during the hospital encounter of 06/13/21  Lipase, blood  Result Value Ref Range   Lipase 25 11 - 51 U/L  Comprehensive metabolic panel  Result Value Ref Range   Sodium 139 135 - 145 mmol/L   Potassium 3.9 3.5 - 5.1 mmol/L   Chloride 109 98 - 111 mmol/L   CO2 24 22 - 32 mmol/L   Glucose, Bld 108 (H) 70 - 99 mg/dL   BUN 16 6 - 20 mg/dL   Creatinine, Ser 7.67 0.44 - 1.00 mg/dL   Calcium  9.0 8.9 - 34.1 mg/dL   Total Protein 7.2 6.5 - 8.1 g/dL   Albumin 4.2 3.5 - 5.0 g/dL   AST 22 15 - 41 U/L   ALT 20 0 - 44 U/L   Alkaline Phosphatase 43 38 - 126 U/L   Total Bilirubin 0.9 0.3 - 1.2 mg/dL   GFR, Estimated >93 >79 mL/min   Anion gap 6 5 - 15  CBC  Result Value Ref Range   WBC 4.9 4.0 - 10.5 K/uL   RBC 4.34 3.87 - 5.11 MIL/uL   Hemoglobin 13.6 12.0 - 15.0 g/dL   HCT 02.4 09.7 - 35.3 %   MCV 84.8 80.0 - 100.0 fL   MCH 31.3 26.0 - 34.0 pg   MCHC 37.0 (H) 30.0 - 36.0 g/dL   RDW 29.9 24.2 - 68.3 %   Platelets 233 150 - 400 K/uL   nRBC 0.0 0.0 - 0.2 %  Urinalysis, Routine w reflex microscopic Urine, Clean Catch  Result Value Ref Range   Color, Urine YELLOW YELLOW   APPearance CLOUDY (A) CLEAR   Specific Gravity, Urine >1.030 (H) 1.005 - 1.030   pH 6.0 5.0 - 8.0   Glucose, UA NEGATIVE NEGATIVE mg/dL   Hgb urine dipstick MODERATE (A) NEGATIVE   Bilirubin Urine NEGATIVE NEGATIVE   Ketones, ur NEGATIVE NEGATIVE mg/dL   Protein, ur TRACE (A) NEGATIVE mg/dL   Nitrite NEGATIVE NEGATIVE   Leukocytes,Ua NEGATIVE NEGATIVE  Urinalysis, Microscopic (reflex)  Result Value Ref Range   RBC / HPF 6-10 0 - 5 RBC/hpf   WBC, UA 0-5 0 - 5 WBC/hpf   Bacteria, UA FEW (A) NONE SEEN   Squamous Epithelial / LPF 6-10 0 - 5   Urine-Other MUCOUS PRESENT   I-Stat beta hCG blood, ED  Result Value Ref Range   I-stat hCG, quantitative <5.0 <5 mIU/mL   Comment 3              Assessment & Plan:   Problem List Items Addressed This Visit   None Visit Diagnoses     Acute cystitis with hematuria    -  Primary   Complete course of antibiotics. Urine sent for culture to evaluate if antibiotic is appropriate. FU if symptoms worsen or fail to improve.   Relevant Orders   Urine Culture   Pain with urination  Relevant Orders   Urinalysis, Routine w reflex microscopic   WET PREP FOR TRICH, YEAST, CLUE        Follow up plan: Return if symptoms worsen or fail to improve.

## 2021-06-27 ENCOUNTER — Ambulatory Visit (INDEPENDENT_AMBULATORY_CARE_PROVIDER_SITE_OTHER): Payer: Medicaid Other | Admitting: Nurse Practitioner

## 2021-06-27 ENCOUNTER — Other Ambulatory Visit: Payer: Self-pay

## 2021-06-27 ENCOUNTER — Encounter: Payer: Self-pay | Admitting: Nurse Practitioner

## 2021-06-27 VITALS — BP 118/62 | HR 69 | Temp 98.2°F | Wt 167.6 lb

## 2021-06-27 DIAGNOSIS — N3001 Acute cystitis with hematuria: Secondary | ICD-10-CM | POA: Diagnosis not present

## 2021-06-27 DIAGNOSIS — R309 Painful micturition, unspecified: Secondary | ICD-10-CM | POA: Diagnosis not present

## 2021-06-27 LAB — URINALYSIS, ROUTINE W REFLEX MICROSCOPIC: Specific Gravity, UA: 1.015 (ref 1.005–1.030)

## 2021-06-27 LAB — WET PREP FOR TRICH, YEAST, CLUE
Clue Cell Exam: NEGATIVE
Trichomonas Exam: NEGATIVE
Yeast Exam: NEGATIVE

## 2021-06-27 LAB — MICROSCOPIC EXAMINATION

## 2021-06-27 MED ORDER — CIPROFLOXACIN HCL 500 MG PO TABS: 500.0000 mg | ORAL_TABLET | Freq: Two times a day (BID) | ORAL | 0 refills | Status: AC

## 2021-06-27 NOTE — Progress Notes (Signed)
Results discussed with patient during visit.

## 2021-07-01 LAB — URINE CULTURE

## 2021-07-01 NOTE — Progress Notes (Signed)
Hi Alyssa Kemp.  Your urine grew e coli.  The antibiotic that I gave you should have taken care of the bacteria. Please let me know if you have any questions.

## 2021-07-24 ENCOUNTER — Ambulatory Visit: Payer: Medicaid Other | Admitting: Nurse Practitioner

## 2021-08-14 ENCOUNTER — Other Ambulatory Visit: Payer: Self-pay | Admitting: Nurse Practitioner

## 2021-08-14 NOTE — Telephone Encounter (Signed)
Requested Prescriptions  Pending Prescriptions Disp Refills   cetirizine (ZYRTEC) 10 MG tablet [Pharmacy Med Name: CETIRIZINE 10MG  TABLETS] 90 tablet 2    Sig: TAKE 1 TABLET(10 MG) BY MOUTH DAILY     Ear, Nose, and Throat:  Antihistamines 2 Passed - 08/14/2021  9:58 AM      Passed - Cr in normal range and within 360 days    Creatinine  Date Value Ref Range Status  12/22/2012 0.77 0.60 - 1.30 mg/dL Final   Creatinine, Ser  Date Value Ref Range Status  06/13/2021 0.75 0.44 - 1.00 mg/dL Final   Creatinine, Urine  Date Value Ref Range Status  04/23/2016 69 mg/dL Final         Passed - Valid encounter within last 12 months    Recent Outpatient Visits          1 month ago Acute cystitis with hematuria   [place], NP   1 month ago Viral gastroenteritis   [place], NP   4 months ago Allergic contact dermatitis, unspecified trigger   [facility] Jon Billings, NP   5 months ago Annual physical exam   [place], NP   9 months ago Diarrhea, unspecified type   [facility] Jon Billings, NP              omeprazole (PRILOSEC) 20 MG capsule [Pharmacy Med Name: OMEPRAZOLE 20MG  CAPSULES] 90 capsule 2    Sig: TAKE 1 CAPSULE(20 MG) BY MOUTH DAILY     Gastroenterology: Proton Pump Inhibitors Passed - 08/14/2021  9:58 AM      Passed - Valid encounter within last 12 months    Recent Outpatient Visits          1 month ago Acute cystitis with hematuria   [place], NP   1 month ago Viral gastroenteritis   [place], NP   4 months ago Allergic contact dermatitis, unspecified trigger   [facility] Jon Billings, NP   5 months ago Annual physical exam   [place], NP   9 months ago Diarrhea, unspecified type   [facility]  Jon Billings, NP

## 2021-08-26 ENCOUNTER — Telehealth: Payer: Self-pay | Admitting: Nurse Practitioner

## 2021-08-26 NOTE — Telephone Encounter (Signed)
Copied from CRM [id number]. Topic: Appointment Scheduling - Scheduling Inquiry for Clinic ?>> Aug 26, 2021  9:15 AM Payton Spark N wrote: ?Reason for CRM: Pt scheduled an appt for 03/17, but also wanted to see about getting some labs done, and requested if they could be ordered for her appt, please advise. ?

## 2021-08-26 NOTE — Telephone Encounter (Signed)
Spoke with patient and she says her immune system is really comprised and she is requesting auto immune check lab and then to have her thyroid labs checked as well. Patient states she has noticed sometimes her whole face is puffy and her neck area is a little bit bigger than usual.  ?

## 2021-08-26 NOTE — Telephone Encounter (Signed)
She wants them before her appt?

## 2021-08-27 ENCOUNTER — Ambulatory Visit: Payer: Medicaid Other | Admitting: Internal Medicine

## 2021-08-27 ENCOUNTER — Other Ambulatory Visit (INDEPENDENT_AMBULATORY_CARE_PROVIDER_SITE_OTHER): Payer: Medicaid Other

## 2021-08-27 ENCOUNTER — Encounter: Payer: Self-pay | Admitting: Internal Medicine

## 2021-08-27 ENCOUNTER — Other Ambulatory Visit: Payer: Self-pay

## 2021-08-27 DIAGNOSIS — N926 Irregular menstruation, unspecified: Secondary | ICD-10-CM

## 2021-08-27 DIAGNOSIS — R631 Polydipsia: Secondary | ICD-10-CM | POA: Insufficient documentation

## 2021-08-27 LAB — BAYER DCA HB A1C WAIVED: HB A1C (BAYER DCA - WAIVED): 4.1 % — ABNORMAL LOW (ref 4.8–5.6)

## 2021-08-27 NOTE — Progress Notes (Signed)
Please let pt know this was normal.

## 2021-08-27 NOTE — Progress Notes (Signed)
? ?There were no vitals taken for this visit.  ? ?Subjective:  ? ? [name], female    DOB: [birth year], 31 y.o.   MRN: [id number] ? ?Chief Complaint  ?Patient presents with  ? Thyroid Problem  ?  Patient is here to discuss possibly thyroid issues as she noticed some neck enlargement and facial swelling. Patient states she has an aunt with Thyroid problems. Patient states she has never be aware of any thyroid issues in the past.   ? Medication Management  ?  Patient states she stopped taking her Vyvanse as she had an reaction to the medication. It causes some redness (flushed) skin and hives. Patient states she is not sure what may have caused the reaction.   ? Auto-Immune Disorder  ?  Patient would like to be tested for a possible auto immune disorder as she is not sure if it is auto-immune disorder or an thyroid problem.  ? ? ?HPI: ?Alyssa Kemp is a 31 y.o. female ? ?Thyroid Problem ?Presents for initial (thinks she has something hormonal going on periods are irregular - coming more frequently - is usually 4 weeks last month was 1 week earlier , and then again.) visit. Symptoms include anxiety, constipation, diarrhea, dry skin, fatigue and menstrual problem. Patient reports no cold intolerance, depressed mood, diaphoresis, hair loss, heat intolerance, hoarse voice, leg swelling, nail problem, palpitations, tremors, visual change, weight gain or weight loss.  ? ?Chief Complaint  ?Patient presents with  ? Thyroid Problem  ?  Patient is here to discuss possibly thyroid issues as she noticed some neck enlargement and facial swelling. Patient states she has an aunt with Thyroid problems. Patient states she has never be aware of any thyroid issues in the past.   ? Medication Management  ?  Patient states she stopped taking her Vyvanse as she had an reaction to the medication. It causes some redness (flushed) skin and hives. Patient states she is not sure what may have caused the reaction.   ?  Auto-Immune Disorder  ?  Patient would like to be tested for a possible auto immune disorder as she is not sure if it is auto-immune disorder or an thyroid problem.  ? ? ?Relevant past medical, surgical, family and social history reviewed and updated as indicated. Interim medical history since our last visit reviewed. ?Allergies and medications reviewed and updated. ? ?Review of Systems  ?Constitutional:  Positive for fatigue. Negative for diaphoresis, weight gain and weight loss.  ?HENT:  Negative for hoarse voice.   ?Cardiovascular:  Negative for palpitations.  ?Gastrointestinal:  Positive for constipation and diarrhea.  ?Endocrine: Negative for cold intolerance and heat intolerance.  ?Genitourinary:  Positive for menstrual problem.  ?Neurological:  Negative for tremors.  ?Psychiatric/Behavioral:  The patient is nervous/anxious.   ? ?Per HPI unless specifically indicated above ? ?   ?Objective:  ?  ?There were no vitals taken for this visit.  ?Wt Readings from Last 3 Encounters:  ?06/27/21 167 lb 9.6 oz (76 kg)  ?06/13/21 169 lb (76.7 kg)  ?03/19/21 173 lb (78.5 kg)  ?  ?Physical Exam ? ?Results for orders placed or performed in visit on 08/27/21  ?TSH  ?Result Value Ref Range  ? TSH 0.820 0.450 - 4.500 uIU/mL  ?T4, free  ?Result Value Ref Range  ? Free T4 1.06 0.82 - 1.77 ng/dL  ?Comprehensive metabolic panel  ?Result Value Ref Range  ? Glucose 86 70 - 99 mg/dL  ?  BUN 15 6 - 20 mg/dL  ? Creatinine, Ser 0.77 0.57 - 1.00 mg/dL  ? eGFR 107 >59 mL/min/1.73  ? BUN/Creatinine Ratio 19 9 - 23  ? Sodium 141 134 - 144 mmol/L  ? Potassium 4.6 3.5 - 5.2 mmol/L  ? Chloride 105 96 - 106 mmol/L  ? CO2 22 20 - 29 mmol/L  ? Calcium 9.5 8.7 - 10.2 mg/dL  ? Total Protein 6.5 6.0 - 8.5 g/dL  ? Albumin 4.5 3.9 - 5.0 g/dL  ? Globulin, Total 2.0 1.5 - 4.5 g/dL  ? Albumin/Globulin Ratio 2.3 (H) 1.2 - 2.2  ? Bilirubin Total 0.3 0.0 - 1.2 mg/dL  ? Alkaline Phosphatase 48 44 - 121 IU/L  ? AST 16 0 - 40 IU/L  ? ALT 12 0 - 32 IU/L  ?Bayer  DCA Hb A1c Waived (STAT)  ?Result Value Ref Range  ? HB A1C (BAYER DCA - WAIVED) 4.1 (L) 4.8 - 5.6 %  ? ?   ? ? ?Current Outpatient Medications:  ?  cetirizine (ZYRTEC) 10 MG tablet, TAKE 1 TABLET(10 MG) BY MOUTH DAILY, Disp: 90 tablet, Rfl: 2 ?  EPINEPHrine (EPIPEN 2-PAK) 0.3 mg/0.3 mL IJ SOAJ injection, Inject 0.3 mLs (0.3 mg total) into the muscle as needed for anaphylaxis., Disp: 2 each, Rfl: 1 ?  Multiple Vitamin (MULTIVITAMINS PO), Take by mouth daily., Disp: , Rfl:  ?  omeprazole (PRILOSEC) 20 MG capsule, TAKE 1 CAPSULE(20 MG) BY MOUTH DAILY, Disp: 90 capsule, Rfl: 2 ?  ondansetron (ZOFRAN) 4 MG tablet, Take 1 tablet (4 mg total) by mouth every 6 (six) hours., Disp: 12 tablet, Rfl: 0 ?  VYVANSE 20 MG capsule, Take 20 mg by mouth every morning. (Patient not taking: Reported on 08/27/2021), Disp: , Rfl:   ? ? ?Assessment & Plan:  ?Anxiety :  ?Check TSH/ Ft4  ?A1c today as well as pt has a ho gestatinal diabetes and is concerned she has a problem with such  ?Doesn't take any mediations sec to trying to take natural meds. Will need to d/w pcp and psych about such  ?Would definitely benefit from meds. ? ?Abnl menses ?Will refer to ob gyn.  ? ? ?Problem List Items Addressed This Visit   ? ?  ? Other  ? Menstrual abnormality - Primary  ? Relevant Orders  ? TSH (Completed)  ? T4, free (Completed)  ? Ambulatory referral to Obstetrics / Gynecology  ? Polydipsia  ? Relevant Orders  ? Comprehensive metabolic panel (Completed)  ? Bayer DCA Hb A1c Waived (STAT) (Completed)  ?  ? ?Orders Placed This Encounter  ?Procedures  ? TSH  ? T4, free  ? Comprehensive metabolic panel  ? Bayer DCA Hb A1c Waived (STAT)  ? Ambulatory referral to Obstetrics / Gynecology  ?  ? ?No orders of the defined types were placed in this encounter. ?  ? ?Follow up plan: ?Return in about 1 week (around 09/03/2021). ? ?

## 2021-08-28 LAB — COMPREHENSIVE METABOLIC PANEL
ALT: 12 IU/L (ref 0–32)
AST: 16 IU/L (ref 0–40)
Albumin/Globulin Ratio: 2.3 — ABNORMAL HIGH (ref 1.2–2.2)
Albumin: 4.5 g/dL (ref 3.9–5.0)
Alkaline Phosphatase: 48 IU/L (ref 44–121)
BUN/Creatinine Ratio: 19 (ref 9–23)
BUN: 15 mg/dL (ref 6–20)
Bilirubin Total: 0.3 mg/dL (ref 0.0–1.2)
CO2: 22 mmol/L (ref 20–29)
Calcium: 9.5 mg/dL (ref 8.7–10.2)
Chloride: 105 mmol/L (ref 96–106)
Creatinine, Ser: 0.77 mg/dL (ref 0.57–1.00)
Globulin, Total: 2 g/dL (ref 1.5–4.5)
Glucose: 86 mg/dL (ref 70–99)
Potassium: 4.6 mmol/L (ref 3.5–5.2)
Sodium: 141 mmol/L (ref 134–144)
Total Protein: 6.5 g/dL (ref 6.0–8.5)
eGFR: 107 mL/min/{1.73_m2} (ref >59)

## 2021-08-28 LAB — TSH: TSH: 0.82 u[IU]/mL (ref 0.450–4.500)

## 2021-08-28 LAB — T4, FREE: Free T4: 1.06 ng/dL (ref 0.82–1.77)

## 2021-09-05 ENCOUNTER — Ambulatory Visit: Payer: Medicaid Other | Admitting: Nurse Practitioner

## 2021-09-25 ENCOUNTER — Encounter: Payer: Medicaid Other | Admitting: Obstetrics and Gynecology

## 2021-09-25 DIAGNOSIS — N926 Irregular menstruation, unspecified: Secondary | ICD-10-CM

## 2021-10-09 ENCOUNTER — Ambulatory Visit: Payer: Medicaid Other | Admitting: Internal Medicine

## 2021-10-10 ENCOUNTER — Encounter: Payer: Self-pay | Admitting: Internal Medicine

## 2021-10-10 ENCOUNTER — Ambulatory Visit: Payer: Medicaid Other | Admitting: Internal Medicine

## 2021-10-10 VITALS — BP 110/72 | HR 69 | Temp 98.5°F | Ht 62.99 in | Wt 164.0 lb

## 2021-10-10 DIAGNOSIS — N9089 Other specified noninflammatory disorders of vulva and perineum: Secondary | ICD-10-CM | POA: Diagnosis not present

## 2021-10-10 NOTE — Progress Notes (Signed)
? ?BP 110/72   Pulse 69   Temp 98.5 ?F (36.9 ?C) (Oral)   Ht 5' 2.99" (1.6 m)   Wt 164 lb (74.4 kg)   SpO2 99%   BMI 29.06 kg/m?   ? ?Subjective:  ? ? [name], female    DOB: [birth year], 31 y.o.   MRN: [id number] ? ?Chief Complaint  ?Patient presents with  ?? vaginal discomfort  ?  Only bothers here when she wipes.   ? ? ?HPI: ?DEZIREE MOKRY is a 31 y.o. female ? ?Vaginal Pain ?The patient's pertinent negatives include no genital itching, genital lesions, genital odor, genital rash, missed menses, pelvic pain, vaginal bleeding or vaginal discharge. This is a new problem. The problem occurs intermittently. The problem has been waxing and waning. Pertinent negatives include no abdominal pain, anorexia, discolored urine, dysuria, fever, flank pain, hematuria, joint pain, joint swelling, nausea, painful intercourse, rash or vomiting. Associated symptoms comments: Labial irritation per pt has a spot on the lateral side of her left labia which shows some redness.. The symptoms are aggravated by tactile pressure and intercourse.  ? ?Chief Complaint  ?Patient presents with  ?? vaginal discomfort  ?  Only bothers here when she wipes.   ? ? ?Relevant past medical, surgical, family and social history reviewed and updated as indicated. Interim medical history since our last visit reviewed. ?Allergies and medications reviewed and updated. ? ?Review of Systems  ?Constitutional:  Negative for fever.  ?Gastrointestinal:  Negative for abdominal pain, anorexia, nausea and vomiting.  ?Genitourinary:  Positive for vaginal pain. Negative for dysuria, flank pain, hematuria, missed menses, pelvic pain and vaginal discharge.  ?Musculoskeletal:  Negative for joint pain.  ?Skin:  Negative for rash.  ? ?Per HPI unless specifically indicated above ? ?   ?Objective:  ?  ?BP 110/72   Pulse 69   Temp 98.5 ?F (36.9 ?C) (Oral)   Ht 5' 2.99" (1.6 m)   Wt 164 lb (74.4 kg)   SpO2 99%   BMI 29.06 kg/m?   ?Wt Readings  from Last 3 Encounters:  ?10/10/21 164 lb (74.4 kg)  ?06/27/21 167 lb 9.6 oz (76 kg)  ?06/13/21 169 lb (76.7 kg)  ?  ?Physical Exam ? ?Results for orders placed or performed in visit on 08/27/21  ?TSH  ?Result Value Ref Range  ? TSH 0.820 0.450 - 4.500 uIU/mL  ?T4, free  ?Result Value Ref Range  ? Free T4 1.06 0.82 - 1.77 ng/dL  ?Comprehensive metabolic panel  ?Result Value Ref Range  ? Glucose 86 70 - 99 mg/dL  ? BUN 15 6 - 20 mg/dL  ? Creatinine, Ser 0.77 0.57 - 1.00 mg/dL  ? eGFR 107 >59 mL/min/1.73  ? BUN/Creatinine Ratio 19 9 - 23  ? Sodium 141 134 - 144 mmol/L  ? Potassium 4.6 3.5 - 5.2 mmol/L  ? Chloride 105 96 - 106 mmol/L  ? CO2 22 20 - 29 mmol/L  ? Calcium 9.5 8.7 - 10.2 mg/dL  ? Total Protein 6.5 6.0 - 8.5 g/dL  ? Albumin 4.5 3.9 - 5.0 g/dL  ? Globulin, Total 2.0 1.5 - 4.5 g/dL  ? Albumin/Globulin Ratio 2.3 (H) 1.2 - 2.2  ? Bilirubin Total 0.3 0.0 - 1.2 mg/dL  ? Alkaline Phosphatase 48 44 - 121 IU/L  ? AST 16 0 - 40 IU/L  ? ALT 12 0 - 32 IU/L  ?Bayer DCA Hb A1c Waived (STAT)  ?Result Value Ref Range  ? HB  A1C (BAYER DCA - WAIVED) 4.1 (L) 4.8 - 5.6 %  ? ?   ? ? ?Current Outpatient Medications:  ??  busPIRone (BUSPAR) 5 MG tablet, Take 5 mg by mouth daily., Disp: , Rfl:  ??  cetirizine (ZYRTEC) 10 MG tablet, TAKE 1 TABLET(10 MG) BY MOUTH DAILY, Disp: 90 tablet, Rfl: 2 ??  EPINEPHrine (EPIPEN 2-PAK) 0.3 mg/0.3 mL IJ SOAJ injection, Inject 0.3 mLs (0.3 mg total) into the muscle as needed for anaphylaxis., Disp: 2 each, Rfl: 1 ??  Multiple Vitamin (MULTIVITAMINS PO), Take by mouth daily., Disp: , Rfl:  ??  omeprazole (PRILOSEC) 20 MG capsule, TAKE 1 CAPSULE(20 MG) BY MOUTH DAILY, Disp: 90 capsule, Rfl: 2  ? ? ?Assessment & Plan:  ?Vaginal irritation  ?To use neosporin ?Avoid wiping to dab the area if needed.  ?No discharge noted / non per pt.  ? ?Problem List Items Addressed This Visit   ?None ?  ? ?No orders of the defined types were placed in this encounter. ?  ? ?No orders of the defined types were placed  in this encounter. ?  ? ?Follow up plan: ?No follow-ups on file. ? ? ? ?

## 2021-10-15 ENCOUNTER — Encounter: Payer: Medicaid Other | Admitting: Obstetrics and Gynecology

## 2021-11-06 ENCOUNTER — Other Ambulatory Visit: Payer: Self-pay

## 2021-11-06 ENCOUNTER — Emergency Department (HOSPITAL_COMMUNITY)
Admission: EM | Admit: 2021-11-06 | Discharge: 2021-11-06 | Disposition: A | Discharge status: Home / Self Care | Payer: Medicaid Other | Attending: Emergency Medicine | Admitting: Emergency Medicine

## 2021-11-06 ENCOUNTER — Encounter (HOSPITAL_COMMUNITY): Payer: Self-pay | Admitting: Oncology

## 2021-11-06 ENCOUNTER — Emergency Department (HOSPITAL_BASED_OUTPATIENT_CLINIC_OR_DEPARTMENT_OTHER): Payer: Medicaid Other

## 2021-11-06 DIAGNOSIS — R6883 Chills (without fever): Secondary | ICD-10-CM | POA: Insufficient documentation

## 2021-11-06 DIAGNOSIS — D72829 Elevated white blood cell count, unspecified: Secondary | ICD-10-CM | POA: Insufficient documentation

## 2021-11-06 DIAGNOSIS — M79662 Pain in left lower leg: Secondary | ICD-10-CM

## 2021-11-06 DIAGNOSIS — M7918 Myalgia, other site: Secondary | ICD-10-CM | POA: Diagnosis not present

## 2021-11-06 DIAGNOSIS — R531 Weakness: Secondary | ICD-10-CM | POA: Insufficient documentation

## 2021-11-06 DIAGNOSIS — M791 Myalgia, unspecified site: Secondary | ICD-10-CM | POA: Insufficient documentation

## 2021-11-06 DIAGNOSIS — M79605 Pain in left leg: Secondary | ICD-10-CM | POA: Insufficient documentation

## 2021-11-06 LAB — CBC WITH DIFFERENTIAL/PLATELET
Abs Immature Granulocytes: 0.04 10*3/uL (ref 0.00–0.07)
Basophils Absolute: 0.1 10*3/uL (ref 0.0–0.1)
Basophils Relative: 1 %
Eosinophils Absolute: 0.1 10*3/uL (ref 0.0–0.5)
Eosinophils Relative: 1 %
HCT: 37.3 % (ref 36.0–46.0)
Hemoglobin: 13.6 g/dL (ref 12.0–15.0)
Immature Granulocytes: 0 %
Lymphocytes Relative: 19 %
Lymphs Abs: 2.3 10*3/uL (ref 0.7–4.0)
MCH: 30.8 pg (ref 26.0–34.0)
MCHC: 36.5 g/dL — ABNORMAL HIGH (ref 30.0–36.0)
MCV: 84.4 fL (ref 80.0–100.0)
Monocytes Absolute: 0.6 10*3/uL (ref 0.1–1.0)
Monocytes Relative: 5 %
Neutro Abs: 8.9 10*3/uL — ABNORMAL HIGH (ref 1.7–7.7)
Neutrophils Relative %: 74 %
Platelets: 275 10*3/uL (ref 150–400)
RBC: 4.42 MIL/uL (ref 3.87–5.11)
RDW: 12.7 % (ref 11.5–15.5)
WBC: 12 10*3/uL — ABNORMAL HIGH (ref 4.0–10.5)
nRBC: 0 % (ref 0.0–0.2)

## 2021-11-06 LAB — COMPREHENSIVE METABOLIC PANEL
ALT: 12 U/L (ref 0–44)
AST: 17 U/L (ref 15–41)
Albumin: 4.2 g/dL (ref 3.5–5.0)
Alkaline Phosphatase: 33 U/L — ABNORMAL LOW (ref 38–126)
Anion gap: 5 (ref 5–15)
BUN: 10 mg/dL (ref 6–20)
CO2: 21 mmol/L — ABNORMAL LOW (ref 22–32)
Calcium: 8.7 mg/dL — ABNORMAL LOW (ref 8.9–10.3)
Chloride: 110 mmol/L (ref 98–111)
Creatinine, Ser: 0.71 mg/dL (ref 0.44–1.00)
GFR, Estimated: >60 mL/min (ref >60)
Glucose, Bld: 106 mg/dL — ABNORMAL HIGH (ref 70–99)
Potassium: 3.9 mmol/L (ref 3.5–5.1)
Sodium: 136 mmol/L (ref 135–145)
Total Bilirubin: 0.9 mg/dL (ref 0.3–1.2)
Total Protein: 6.8 g/dL (ref 6.5–8.1)

## 2021-11-06 LAB — PHOSPHORUS: Phosphorus: 2.8 mg/dL (ref 2.5–4.6)

## 2021-11-06 LAB — CK: Total CK: 83 U/L (ref 38–234)

## 2021-11-06 LAB — MAGNESIUM: Magnesium: 2 mg/dL (ref 1.7–2.4)

## 2021-11-06 MED ORDER — ACETAMINOPHEN 500 MG PO TABS
1000.0000 mg | ORAL_TABLET | Freq: Once | ORAL | Status: AC
  Administered 2021-11-06: 1000 mg via ORAL
  Filled 2021-11-06: qty 2

## 2021-11-06 NOTE — ED Triage Notes (Signed)
Pt reports donating plasma on Tuesday for the first time. She states they obtained plasma from the left arm, is now c/o left leg pain believes there may be a correlation. Denies injury to left leg.

## 2021-11-06 NOTE — Discharge Instructions (Signed)
1.  Continue to hydrate and eat nutritious food.  Take extra strength Tylenol if needed for muscle aches. 2.  Return to emergency department if you develop a fever, visible redness or swelling, unusual rash, headache, visual problems, chest pain or shortness of breath. 3.  Schedule a recheck with your doctor within the next 2 to 4 days.

## 2021-11-06 NOTE — Progress Notes (Signed)
Left lower extremity venous duplex completed. Refer to "CV Proc" under chart review to view preliminary results.  11/06/2021 12:24 PM Kelby Aline., MHA, RVT, RDCS, RDMS

## 2021-11-06 NOTE — ED Provider Notes (Signed)
[facility]-EMERGENCY DEPT Provider Note   CSN: [id number] Arrival date & time: 11/06/21  [id number]     History  Chief Complaint  Patient presents with   Leg Pain    Alyssa Kemp is a 31 y.o. female.  HPI Patient donated plasma 2 days ago.  She reports since then she has felt some general weakness.  She is also been having pain in her calves especially with walking or standing.  Much more so on the left.  She reports at night she is getting hot and cold episodes.  She is not sleeping well.  Patient denies she had a documented fever.  No chest pain or shortness of breath.  No headache.  No vomiting.  Patient denies history of DVT.  She has had tubes tied and is not on birth control.  No recent long travel.  No close family members with DVT or PE.  Patient reports that she smokes a half a pack of cigarettes per day.  No drug or alcohol use.    Home Medications Prior to Admission medications   Medication Sig Start Date End Date Taking? Authorizing Provider  busPIRone (BUSPAR) 5 MG tablet Take 5 mg by mouth daily. 10/05/21   [provider]  cetirizine (ZYRTEC) 10 MG tablet TAKE 1 TABLET(10 MG) BY MOUTH DAILY 08/14/21   Larae GroomsHoldsworth, Karen, NP  EPINEPHrine (EPIPEN 2-PAK) 0.3 mg/0.3 mL IJ SOAJ injection Inject 0.3 mLs (0.3 mg total) into the muscle as needed for anaphylaxis. 12/15/19   Particia NearingLane, Rachel Elizabeth, PA-C  Multiple Vitamin (MULTIVITAMINS PO) Take by mouth daily.    [provider]  omeprazole (PRILOSEC) 20 MG capsule TAKE 1 CAPSULE(20 MG) BY MOUTH DAILY 08/14/21   Larae GroomsHoldsworth, Karen, NP      Allergies    Sulfacetamide sodium, Sulfasalazine, Tussionex pennkinetic er [hydrocod poli-chlorphe poli er], Doxycycline, Vyvanse [lisdexamfetamine], Macrobid [nitrofurantoin], and Amoxicillin-pot clavulanate    Review of Systems   Review of Systems 10 systems reviewed negative except as per HPI Physical Exam Updated Vital Signs BP (!) 118/57 (BP  Location: Right Arm)   Pulse 75   Temp 98 F (36.7 C) (Oral)   Resp 16   Ht 5\' 4"  (1.626 m)   Wt 74.8 kg   SpO2 100%   BMI 28.32 kg/m  Physical Exam Constitutional:      Appearance: Normal appearance.  HENT:     Mouth/Throat:     Pharynx: Oropharynx is clear.  Eyes:     Extraocular Movements: Extraocular movements intact.  Cardiovascular:     Rate and Rhythm: Normal rate and regular rhythm.  Pulmonary:     Effort: Pulmonary effort is normal.     Breath sounds: Normal breath sounds.  Abdominal:     General: There is no distension.     Palpations: Abdomen is soft.     Tenderness: There is no abdominal tenderness. There is no guarding.  Musculoskeletal:        General: Tenderness present. No swelling. Normal range of motion.     Right lower leg: No edema.     Left lower leg: No edema.  Skin:    General: Skin is warm and dry.     Findings: No rash.  Neurological:     General: No focal deficit present.     Mental Status: She is alert and oriented to person, place, and time.     Motor: No weakness.     Coordination: Coordination normal.  Psychiatric:  Mood and Affect: Mood normal.    ED Results / Procedures / Treatments   Labs (all labs ordered are listed, but only abnormal results are displayed) Labs Reviewed  COMPREHENSIVE METABOLIC PANEL - Abnormal; Notable for the following components:      Result Value   CO2 21 (*)    Glucose, Bld 106 (*)    Calcium 8.7 (*)    Alkaline Phosphatase 33 (*)    All other components within normal limits  CBC WITH DIFFERENTIAL/PLATELET - Abnormal; Notable for the following components:   WBC 12.0 (*)    MCHC 36.5 (*)    Neutro Abs 8.9 (*)    All other components within normal limits  CK  MAGNESIUM  PHOSPHORUS    EKG None  Radiology VAS [place] LOWER EXTREMITY VENOUS (DVT) (7a-7p)  Result Date: 11/06/2021  Lower Venous DVT Study Patient Name:  Alyssa Kemp  Date of Exam:   11/06/2021 Medical Rec #: [id number]          Accession #:    [id number] Date of Birth: 18-Jan-1991         Patient Gender: F Patient Age:   4 years Exam Location:  [facility] Procedure:      VAS [place] LOWER EXTREMITY VENOUS (DVT) Referring Phys: [facility] --------------------------------------------------------------------------------  Indications: Left calf pain.  Comparison Study: No prior study Performing Technologist: Gertie Fey MHA, RDMS, RVT, RDCS  Examination Guidelines: A complete evaluation includes B-mode imaging, spectral Doppler, color Doppler, and power Doppler as needed of all accessible portions of each vessel. Bilateral testing is considered an integral part of a complete examination. Limited examinations for reoccurring indications may be performed as noted. The reflux portion of the exam is performed with the patient in reverse Trendelenburg.  +-----+---------------+---------+-----------+----------+--------------+ RIGHTCompressibilityPhasicitySpontaneityPropertiesThrombus Aging +-----+---------------+---------+-----------+----------+--------------+ CFV  Full           Yes      Yes                                 +-----+---------------+---------+-----------+----------+--------------+   +---------+---------------+---------+-----------+----------+--------------+ LEFT     CompressibilityPhasicitySpontaneityPropertiesThrombus Aging +---------+---------------+---------+-----------+----------+--------------+ CFV      Full           Yes      Yes                                 +---------+---------------+---------+-----------+----------+--------------+ SFJ      Full                                                        +---------+---------------+---------+-----------+----------+--------------+ FV Prox  Full                                                        +---------+---------------+---------+-----------+----------+--------------+ FV Mid   Full                                                         +---------+---------------+---------+-----------+----------+--------------+  FV DistalFull                                                        +---------+---------------+---------+-----------+----------+--------------+ PFV      Full                                                        +---------+---------------+---------+-----------+----------+--------------+ POP      Full           Yes      Yes                                 +---------+---------------+---------+-----------+----------+--------------+ PTV      Full                                                        +---------+---------------+---------+-----------+----------+--------------+ PERO     Full                                                        +---------+---------------+---------+-----------+----------+--------------+   Left Technical Findings: Not visualized segments include limited visualization PTV and peroneal veins.   Summary: RIGHT: - No evidence of common femoral vein obstruction.  LEFT: - There is no evidence of deep vein thrombosis in the lower extremity. However, portions of this examination were limited- see technologist comments above.  - No cystic structure found in the popliteal fossa.  *See table(s) above for measurements and observations.    Preliminary     Procedures Procedures    Medications Ordered in ED Medications  acetaminophen (TYLENOL) tablet 1,000 mg (1,000 mg Oral Given 11/06/21 1218)    ED Course/ Medical Decision Making/ A&P                           Medical Decision Making Amount and/or Complexity of Data Reviewed Labs: ordered.  Risk OTC drugs.   Patient presents with generalized symptoms after giving plasma 2 days ago.  She perceives hot and cold episodes at night.  No documented fever.  Fatigue and aching in her legs but pain in the calf on the left.  Clinically patient is well in appearance.  Vital signs are stable.  She is not febrile  tachycardic or hypoxic.  Consult: Reviewed with Dr. Clelia Croft hematology.  Recommends getting CBC with differential and chemistry panel.  If normal, recommendation is for hydration and rest.  Patient is clinically well in appearance.  Vital signs are normal.  DVT study is negative.  Patient has mild leukocytosis.  At this time this is nonspecific.  Patient clinically well and no significant findings on lab work, patient is stable for discharge.  I have reviewed careful return precautions in the event the patient develops any  fever, chest pain, shortness of breath, headache, objective swelling or redness or other concerning symptoms.  She is to continue hydrating and rest for the next 24 hours and return if any deteriorating condition.        Final Clinical Impression(s) / ED Diagnoses Final diagnoses:  Myalgia  Left leg pain  Chills (without fever)    Rx / DC Orders ED Discharge Orders     None         Arby Barrette, MD 11/06/21 1252

## 2021-11-07 ENCOUNTER — Telehealth: Payer: Self-pay | Admitting: *Deleted

## 2021-11-07 NOTE — Telephone Encounter (Signed)
Transition Care Management Unsuccessful Follow-up Telephone Call  Date of discharge and from where:  11/06/2021 - [facility]  Attempts:  1st Attempt  Reason for unsuccessful TCM follow-up call:  Left voice message

## 2021-11-10 NOTE — Telephone Encounter (Signed)
Transition Care Management Unsuccessful Follow-up Telephone Call  Date of discharge and from where:  [facility]  Attempts:  2nd Attempt  Reason for unsuccessful TCM follow-up call:  Left voice message

## 2021-11-11 NOTE — Telephone Encounter (Signed)
Transition Care Management Unsuccessful Follow-up Telephone Call  Date of discharge and from where:  11/06/2021 - [facility]  Attempts:  3rd Attempt  Reason for unsuccessful TCM follow-up call:  Unable to reach patient

## 2021-11-18 ENCOUNTER — Ambulatory Visit: Payer: Medicaid Other | Admitting: Obstetrics and Gynecology

## 2021-11-18 ENCOUNTER — Encounter: Payer: Self-pay | Admitting: Obstetrics and Gynecology

## 2021-11-18 DIAGNOSIS — N926 Irregular menstruation, unspecified: Secondary | ICD-10-CM

## 2021-11-18 DIAGNOSIS — Z7689 Persons encountering health services in other specified circumstances: Secondary | ICD-10-CM

## 2021-11-18 DIAGNOSIS — N971 Female infertility of tubal origin: Secondary | ICD-10-CM

## 2021-11-18 DIAGNOSIS — Z3169 Encounter for other general counseling and advice on procreation: Secondary | ICD-10-CM

## 2021-11-18 NOTE — Progress Notes (Signed)
Patient presents today to discuss conception possibilities. She states she is looking into tubal reversal and wants to know what her chances are  of becoming pregnant. No other concerns at this time.

## 2021-11-18 NOTE — Progress Notes (Signed)
HPI:      Alyssa Kemp is a 31 y.o. [phone] who LMP was Patient's last menstrual period was 11/13/2021.  Subjective:   She presents today to discuss infertility secondary to prior modified [place] tubal ligation.  Patient has had sections of her tubes removed.  She is now having normal regular cycles monthly.  She has a new husband and would like to have children with him.  She is considering 2 or 3 additional babies.  She had questions regarding if she needed particular lab work and wanted to discuss the possibility of in vitro versus tubal reanastomosis.    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 She does not have any pertinent problems on file. She  has a past surgical history that includes Tonsillectomy; Adenoidectomy; Wisdom tooth extraction; Colposcopy (01/15/2016); Tubal ligation (Bilateral, 09/19/2016); and Cystoscopy w/ retrogrades (Bilateral, 12/28/2017). Her family history includes Breast cancer in her paternal aunt; Cervical cancer in her mother; Congestive Heart Failure in her maternal grandfather; Coronary artery disease in her maternal grandfather; Diabetes in her maternal grandfather, maternal grandmother, and paternal grandmother; Heart disease in her paternal grandfather; Hypertension in her maternal grandfather, maternal grandmother, paternal grandfather, and paternal grandmother; Ovarian cancer in her mother; Pancreatic cancer in her maternal grandmother; Prostate cancer in her maternal grandfather. She  reports that she has never smoked. She has never used smokeless tobacco. She  reports that she does not drink alcohol and does not use drugs. She has a current medication list which includes the following prescription(s): buspirone, cetirizine, epinephrine, multiple vitamin, omeprazole, and selenium. She is allergic to sulfacetamide sodium, sulfasalazine, tussionex pennkinetic er [employer] poli-chlorphe poli er], doxycycline, vyvanse [lisdexamfetamine], macrobid [nitrofurantoin], and amoxicillin-pot clavulanate.       Review of Systems:  Review of Systems  Constitutional: Denied constitutional symptoms, night sweats, recent illness, fatigue, fever, insomnia and weight loss.  Eyes: Denied eye symptoms, eye pain, photophobia, vision change and visual disturbance.  Ears/Nose/Throat/Neck: Denied ear, nose, throat or neck symptoms, hearing loss, nasal discharge, sinus congestion and sore throat.  Cardiovascular: Denied cardiovascular symptoms, arrhythmia, chest pain/pressure, edema, exercise intolerance, orthopnea and palpitations.  Respiratory: Denied pulmonary symptoms, asthma, pleuritic pain, productive sputum, cough, dyspnea and wheezing.  Gastrointestinal: Denied, gastro-esophageal reflux, melena, nausea and vomiting.  Genitourinary: Denied genitourinary symptoms including symptomatic vaginal discharge, pelvic relaxation issues, and urinary complaints.  Musculoskeletal: Denied musculoskeletal symptoms, stiffness, swelling, muscle weakness and myalgia.  Dermatologic: Denied dermatology symptoms, rash and scar.  Neurologic: Denied neurology symptoms, dizziness, headache, neck pain and syncope.  Psychiatric: Denied psychiatric symptoms, anxiety and depression.  Endocrine: Denied endocrine symptoms including hot flashes and night sweats.   Meds:   Current Outpatient Medications on File Prior to Visit  Medication Sig Dispense Refill   busPIRone (BUSPAR) 5 MG tablet Take 5 mg by mouth daily.     cetirizine (ZYRTEC) 10 MG tablet TAKE 1 TABLET(10 MG) BY MOUTH DAILY 90 tablet  2   EPINEPHrine (EPIPEN 2-PAK) 0.3 mg/0.3 mL IJ SOAJ injection Inject 0.3 mLs (0.3 mg total) into the muscle as needed for anaphylaxis. 2 each 1   Multiple Vitamin (MULTIVITAMINS PO)  Take by mouth daily.     omeprazole (PRILOSEC) 20 MG capsule TAKE 1 CAPSULE(20 MG) BY MOUTH DAILY 90 capsule 2   Selenium (SELENIMIN PO) Take by mouth.     No current facility-administered medications on file prior to visit.      Objective:     There were no vitals filed for this visit. There were no vitals filed for this visit.                      Assessment:    [id number] Patient Active Problem List   Diagnosis Date Noted   Labia irritation 10/10/2021   Menstrual abnormality 08/27/2021   Polydipsia 08/27/2021   History of 2019 novel coronavirus disease (COVID-19) 07/26/2020   Allergic rhinitis 09/08/2018   Bipolar 1 disorder (HCC) 09/05/2016   Rh negative state in antepartum period 09/05/2016   GERD (gastroesophageal reflux disease) 08/28/2016   Anxiety, generalized 11/05/2015   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/05/2015     1. Establishing care with new doctor, encounter for   2. Pre-conception counseling   3. Infertility of tubal origin        Plan:            1.  Discussed too early anastomosis versus in vitro in detail.  Risk benefits of each discussed cost of each discussed.  Process for tubal reversal versus process versus in vitro discussed.  At this time patient is leaning toward tubal reversal because she would like to have multiple additional children and she would like to do it more "natural".  2.  Referral to Dr. April Manson for tubal reversal consultation  Orders Orders Placed This Encounter  Procedures   Ambulatory referral to Infertility    No orders of the defined types were placed in this encounter.     F/U  No follow-ups on file. I spent 32 minutes involved in the care of this patient preparing to see the patient by obtaining and reviewing her  medical history (including labs, imaging tests and prior procedures), documenting clinical information in the electronic health record (EHR), counseling and coordinating care plans, writing and sending prescriptions, ordering tests or procedures and in direct communicating with the patient and medical staff discussing pertinent items from her history and physical exam.  Elonda Husky, M.D. 11/18/2021 10:37 AM

## 2021-11-20 ENCOUNTER — Telehealth: Payer: Medicaid Other | Admitting: Family Medicine

## 2021-11-20 DIAGNOSIS — R051 Acute cough: Secondary | ICD-10-CM | POA: Diagnosis not present

## 2021-11-20 DIAGNOSIS — B9689 Other specified bacterial agents as the cause of diseases classified elsewhere: Secondary | ICD-10-CM

## 2021-11-20 DIAGNOSIS — Z889 Allergy status to unspecified drugs, medicaments and biological substances status: Secondary | ICD-10-CM | POA: Diagnosis not present

## 2021-11-20 DIAGNOSIS — J019 Acute sinusitis, unspecified: Secondary | ICD-10-CM | POA: Diagnosis not present

## 2021-11-20 MED ORDER — PROMETHAZINE-DM 6.25-15 MG/5ML PO SYRP: 5.0000 mL | ORAL_SOLUTION | Freq: Three times a day (TID) | ORAL | 0 refills | Status: DC | PRN

## 2021-11-20 MED ORDER — AZITHROMYCIN 250 MG PO TABS: ORAL_TABLET | ORAL | 0 refills | Status: AC

## 2021-11-20 NOTE — Progress Notes (Signed)
Virtual Visit Consent   Alyssa Kemp, you are scheduled for a virtual visit with a [facility] provider today. Just as with appointments in the office, your consent must be obtained to participate. Your consent will be active for this visit and any virtual visit you may have with one of our providers in the next 365 days. If you have a MyChart account, a copy of this consent can be sent to you electronically.  As this is a virtual visit, video technology does not allow for your provider to perform a traditional examination. This may limit your provider's ability to fully assess your condition. If your provider identifies any concerns that need to be evaluated in person or the need to arrange testing (such as labs, EKG, etc.), we will make arrangements to do so. Although advances in technology are sophisticated, we cannot ensure that it will always work on either your end or our end. If the connection with a video visit is poor, the visit may have to be switched to a telephone visit. With either a video or telephone visit, we are not always able to ensure that we have a secure connection.  By engaging in this virtual visit, you consent to the provision of healthcare and authorize for your insurance to be billed (if applicable) for the services provided during this visit. Depending on your insurance coverage, you may receive a charge related to this service.  I need to obtain your verbal consent now. Are you willing to proceed with your visit today? THANH POMERLEAU has provided verbal consent on 11/20/2021 for a virtual visit (video or telephone). Freddy Finner, NP  Date: 11/20/2021 8:30 AM  Virtual Visit via Video Note   I, Freddy Finner, connected with  Alyssa Kemp  ([id number], Oct 29, 1990) on 11/20/21 at  9:00 AM EDT by a video-enabled telemedicine application and verified that I am speaking with the correct person using two identifiers.  Location: Patient: Virtual Visit Location Patient:  Home Provider: Virtual Visit Location Provider: Home Office   I discussed the limitations of evaluation and management by telemedicine and the availability of in person appointments. The patient expressed understanding and agreed to proceed.    History of Present Illness: Alyssa Kemp is a 31 y.o. who identifies as a female who was assigned female at birth, and is being seen today for cough and mucus production that started 11/08/21. Started a few days after donating plasma. WBC count was elevated in ED when checked after she had a reaction to the donation.  Reports increased nasal congestion, mucus is yellow- at times a tinge of blood when she coughs hard, headache-facial pain, and persistent cough. Denies ear pain, sore throat (outside of coughing so much); chest pain and shortness of breath. Allergic to doxy and augmentin. No pregnant.   Problems:  Patient Active Problem List   Diagnosis Date Noted   Labia irritation 10/10/2021   Menstrual abnormality 08/27/2021   Polydipsia 08/27/2021   History of 2019 novel coronavirus disease (COVID-19) 07/26/2020   Allergic rhinitis 09/08/2018   Bipolar 1 disorder (HCC) 09/05/2016   Rh negative state in antepartum period 09/05/2016   GERD (gastroesophageal reflux disease) 08/28/2016   Anxiety, generalized 11/05/2015   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/05/2015    Allergies:  Allergies  Allergen Reactions   Sulfacetamide Sodium Shortness Of Breath   Sulfasalazine Shortness Of Breath   Tussionex Pennkinetic Er [Hydrocod Poli-Chlorphe Poli Er] Hives and Swelling  Doxycycline Swelling   Vyvanse [Lisdexamfetamine] Rash   Macrobid [Nitrofurantoin] Hives   Amoxicillin-Pot Clavulanate Other (See Comments)    Unknown- from childhood   Medications:  Current Outpatient Medications:    azithromycin (ZITHROMAX) 250 MG tablet, Take 2 tablets on day 1, then 1 tablet daily on days 2 through 5, Disp: 6 tablet, Rfl: 0    promethazine-dextromethorphan (PROMETHAZINE-DM) 6.25-15 MG/5ML syrup, Take 5 mLs by mouth 3 (three) times daily as needed for cough., Disp: 118 mL, Rfl: 0   busPIRone (BUSPAR) 5 MG tablet, Take 5 mg by mouth daily., Disp: , Rfl:    cetirizine (ZYRTEC) 10 MG tablet, TAKE 1 TABLET(10 MG) BY MOUTH DAILY, Disp: 90 tablet, Rfl: 2   EPINEPHrine (EPIPEN 2-PAK) 0.3 mg/0.3 mL IJ SOAJ injection, Inject 0.3 mLs (0.3 mg total) into the muscle as needed for anaphylaxis., Disp: 2 each, Rfl: 1   Multiple Vitamin (MULTIVITAMINS PO), Take by mouth daily., Disp: , Rfl:    omeprazole (PRILOSEC) 20 MG capsule, TAKE 1 CAPSULE(20 MG) BY MOUTH DAILY, Disp: 90 capsule, Rfl: 2   Selenium (SELENIMIN PO), Take by mouth., Disp: , Rfl:   Observations/Objective: Patient is well-developed, well-nourished in no acute distress.  Resting comfortably  at home.  Head is normocephalic, atraumatic.  No labored breathing.  Speech is clear and coherent with logical content.  Patient is alert and oriented at baseline.  Cough and nasal tone noted  Assessment and Plan:  1. Acute bacterial sinusitis  - promethazine-dextromethorphan (PROMETHAZINE-DM) 6.25-15 MG/5ML syrup; Take 5 mLs by mouth 3 (three) times daily as needed for cough.  Dispense: 118 mL; Refill: 0 - azithromycin (ZITHROMAX) 250 MG tablet; Take 2 tablets on day 1, then 1 tablet daily on days 2 through 5  Dispense: 6 tablet; Refill: 0  2. Acute cough  - promethazine-dextromethorphan (PROMETHAZINE-DM) 6.25-15 MG/5ML syrup; Take 5 mLs by mouth 3 (three) times daily as needed for cough.  Dispense: 118 mL; Refill: 0  3. Multiple drug allergies  - azithromycin (ZITHROMAX) 250 MG tablet; Take 2 tablets on day 1, then 1 tablet daily on days 2 through 5  Dispense: 6 tablet; Refill: 0   -S&S are consistent with viral turn bacterial sinus infection -cough and mucus persistent -several anbx allergies so z pack ordered with cough syrup -rest, hydration and OTC reviewed and  info on AVS   Reviewed side effects, risks and benefits of medication.    Patient acknowledged agreement and understanding of the plan.    Follow Up Instructions: I discussed the assessment and treatment plan with the patient. The patient was provided an opportunity to ask questions and all were answered. The patient agreed with the plan and demonstrated an understanding of the instructions.  A copy of instructions were sent to the patient via MyChart unless otherwise noted below.   The patient was advised to call back or seek an in-person evaluation if the symptoms worsen or if the condition fails to improve as anticipated.  Time:  I spent 15 minutes with the patient via telehealth technology discussing the above problems/concerns.    Freddy Finner, NP

## 2021-11-20 NOTE — Patient Instructions (Signed)

## 2021-12-04 ENCOUNTER — Encounter (HOSPITAL_COMMUNITY): Payer: Self-pay | Admitting: Emergency Medicine

## 2021-12-04 ENCOUNTER — Emergency Department (HOSPITAL_COMMUNITY)
Admission: EM | Admit: 2021-12-04 | Discharge: 2021-12-04 | Disposition: A | Discharge status: Home / Self Care | Payer: Medicaid Other | Attending: Emergency Medicine | Admitting: Emergency Medicine

## 2021-12-04 DIAGNOSIS — T310 Burns involving less than 10% of body surface: Secondary | ICD-10-CM | POA: Insufficient documentation

## 2021-12-04 DIAGNOSIS — T23191A Burn of first degree of multiple sites of right wrist and hand, initial encounter: Secondary | ICD-10-CM | POA: Diagnosis present

## 2021-12-04 DIAGNOSIS — X19XXXA Contact with other heat and hot substances, initial encounter: Secondary | ICD-10-CM | POA: Insufficient documentation

## 2021-12-04 MED ORDER — LIDOCAINE 5 % EX OINT: 1.0000 | TOPICAL_OINTMENT | CUTANEOUS | 0 refills | Status: DC | PRN

## 2021-12-04 MED ORDER — OXYCODONE-ACETAMINOPHEN 5-325 MG PO TABS
1.0000 | ORAL_TABLET | Freq: Four times a day (QID) | ORAL | Status: DC | PRN
  Administered 2021-12-04: 1 via ORAL
  Filled 2021-12-04: qty 1

## 2021-12-04 MED ORDER — LIDOCAINE 4 % EX CREA
TOPICAL_CREAM | Freq: Once | CUTANEOUS | Status: AC
  Filled 2021-12-04: qty 5

## 2021-12-04 MED ORDER — OXYCODONE-ACETAMINOPHEN 5-325 MG PO TABS: 1.0000 | ORAL_TABLET | ORAL | 0 refills | Status: DC | PRN

## 2021-12-04 NOTE — Discharge Instructions (Addendum)
Please call the burn center at [facility] at [phone].  Please request to specifically follow-up with Dr. Mardene Speak.   Continue using the dry gauze wrap to the affected area.   Use antibiotic ointment if any blisters open up.   Come back if you develop numbness, tingling, or worsening redness that extends to other side of hand.
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
## 2021-12-05 ENCOUNTER — Telehealth: Payer: Self-pay

## 2021-12-05 NOTE — Telephone Encounter (Signed)
Transition Care Management Follow-up Telephone Call Date of discharge and from where: 12/05/2021 from [facility] How have you been since you were released from the hospital? Patient stated that she is feeling better, but still having some tenderness. Patient did not have any questions or concerns at this time.  Any questions or concerns? No  Items Reviewed: Did the pt receive and understand the discharge instructions provided? Yes  Medications obtained and verified? Yes  Other? No  Any new allergies since your discharge? No  Dietary orders reviewed? No Do you have support at home? Yes   Functional Questionnaire: (I = Independent and D = Dependent) ADLs: I  Bathing/Dressing- I  Meal Prep- I  Eating- I  Maintaining continence- I  Transferring/Ambulation- I  Managing Meds- I   Follow up appointments reviewed:  PCP Hospital f/u appt confirmed? No  Specialist Hospital f/u appt confirmed? Yes  Patient is waiting for plastic surgeon to call.  Are transportation arrangements needed? No  If their condition worsens, is the pt aware to call PCP or go to the Emergency Dept.? Yes Was the patient provided with contact information for the PCP's office or ED? Yes Was to pt encouraged to call back with questions or concerns? Yes

## 2022-01-07 DIAGNOSIS — J029 Acute pharyngitis, unspecified: Secondary | ICD-10-CM | POA: Diagnosis not present

## 2022-01-07 DIAGNOSIS — R059 Cough, unspecified: Secondary | ICD-10-CM | POA: Diagnosis not present

## 2022-01-07 DIAGNOSIS — M791 Myalgia, unspecified site: Secondary | ICD-10-CM | POA: Diagnosis not present

## 2022-01-07 DIAGNOSIS — J329 Chronic sinusitis, unspecified: Secondary | ICD-10-CM | POA: Diagnosis not present

## 2022-03-20 ENCOUNTER — Encounter: Payer: Self-pay | Admitting: Family Medicine

## 2022-03-20 ENCOUNTER — Ambulatory Visit: Payer: Medicaid Other | Admitting: Family Medicine

## 2022-03-20 VITALS — BP 110/73 | HR 74 | Temp 98.9°F | Ht 64.0 in | Wt 164.9 lb

## 2022-03-20 DIAGNOSIS — Z800 Family history of malignant neoplasm of digestive organs: Secondary | ICD-10-CM

## 2022-03-20 DIAGNOSIS — K047 Periapical abscess without sinus: Secondary | ICD-10-CM

## 2022-03-20 MED ORDER — LIDOCAINE VISCOUS HCL 2 % MT SOLN: 15.0000 mL | OROMUCOSAL | 1 refills | Status: DC | PRN

## 2022-03-20 MED ORDER — CLINDAMYCIN HCL 300 MG PO CAPS: 300.0000 mg | ORAL_CAPSULE | Freq: Three times a day (TID) | ORAL | 0 refills | Status: DC

## 2022-03-20 NOTE — Progress Notes (Signed)
BP 110/73   Pulse 74   Temp 98.9 F (37.2 C) (Oral)   Ht 5\' 4"  (1.626 m)   Wt 164 lb 14.4 oz (74.8 kg)   SpO2 98%   BMI 28.31 kg/m    Subjective:    Patient ID: , female    DOB: 1990/11/08, 31 y.o.   MRN: [id number]  HPI: Alyssa Kemp is a 31 y.o. female  Chief Complaint  Patient presents with   Dental Pain    Patient is here for possible infection of her tooth. Patient says during the summer she has been using the metal tooth scraper and noticed she scraped off a piece of something. Patient says the past three weeks, she has had some pain and discomfort. Patient says she feels as if something is leaking. Patient says she is scheduled with her dentist on Wednesday. Patient has not noticed in facial swelling. Patient says the pain is radiating up into the temporal area.    DENTAL PAIN Duration: 3 weeks Involved teeth: left and upper Dentist evaluation: no Mechanism of injury:   has been using a metal pick Onset: sudden Severity: moderate Quality: aching  Frequency: constant Radiation: into her sinus Aggravating factors: cold, heat, hard foods, and chewing Alleviating factors: nothing Status: worse Treatments attempted: APAP and NSAIDs Relief with NSAIDs?: moderate Fevers: no Swelling: no Redness: yes Paresthesias / decreased sensation: no Sinus pressure: yes  Relevant past medical, surgical, family and social history reviewed and updated as indicated. Interim medical history since our last visit reviewed. Allergies and medications reviewed and updated.  Review of Systems  Constitutional: Negative.   HENT:  Positive for dental problem. Negative for congestion, drooling, ear discharge, ear pain, facial swelling, hearing loss, mouth sores, nosebleeds, postnasal drip, rhinorrhea, sinus pressure, sinus pain, sneezing, sore throat, tinnitus, trouble swallowing and voice change.   Respiratory: Negative.    Cardiovascular: Negative.   Gastrointestinal:  Negative.   Musculoskeletal: Negative.   Neurological: Negative.   Psychiatric/Behavioral: Negative.      Per HPI unless specifically indicated above     Objective:    BP 110/73   Pulse 74   Temp 98.9 F (37.2 C) (Oral)   Ht 5\' 4"  (1.626 m)   Wt 164 lb 14.4 oz (74.8 kg)   SpO2 98%   BMI 28.31 kg/m   Wt Readings from Last 3 Encounters:  03/20/22 164 lb 14.4 oz (74.8 kg)  11/06/21 165 lb (74.8 kg)  10/10/21 164 lb (74.4 kg)    Physical Exam Vitals and nursing note reviewed.  Constitutional:      General: She is not in acute distress.    Appearance: Normal appearance. She is well-developed.  HENT:     Head: Normocephalic and atraumatic.     Right Ear: Hearing and external ear normal.     Left Ear: Hearing and external ear normal.     Nose: Nose normal.     Mouth/Throat:     Mouth: Mucous membranes are moist.     Pharynx: Oropharynx is clear.     Comments: Erythematous and swollen tooth on upper L side, broken tooth Eyes:     General: Lids are normal. No scleral icterus.       Right eye: No discharge.        Left eye: No discharge.     Conjunctiva/sclera: Conjunctivae normal.  Pulmonary:     Effort: Pulmonary effort is normal. No respiratory distress.  Musculoskeletal:  General: Normal range of motion.  Skin:    Coloration: Skin is not jaundiced or pale.     Findings: No bruising, erythema, lesion or rash.  Neurological:     General: No focal deficit present.     Mental Status: She is alert and oriented to person, place, and time. Mental status is at baseline.  Psychiatric:        Mood and Affect: Mood normal.        Speech: Speech normal.        Behavior: Behavior normal.        Thought Content: Thought content normal.        Judgment: Judgment normal.     Results for orders placed or performed during the hospital encounter of 11/06/21  Comprehensive metabolic panel  Result Value Ref Range   Sodium 136 135 - 145 mmol/L   Potassium 3.9 3.5 - 5.1  mmol/L   Chloride 110 98 - 111 mmol/L   CO2 21 (L) 22 - 32 mmol/L   Glucose, Bld 106 (H) 70 - 99 mg/dL   BUN 10 6 - 20 mg/dL   Creatinine, Ser 5.46 0.44 - 1.00 mg/dL   Calcium 8.7 (L) 8.9 - 10.3 mg/dL   Total Protein 6.8 6.5 - 8.1 g/dL   Albumin 4.2 3.5 - 5.0 g/dL   AST 17 15 - 41 U/L   ALT 12 0 - 44 U/L   Alkaline Phosphatase 33 (L) 38 - 126 U/L   Total Bilirubin 0.9 0.3 - 1.2 mg/dL   GFR, Estimated >56 >81 mL/min   Anion gap 5 5 - 15  CBC with Differential  Result Value Ref Range   WBC 12.0 (H) 4.0 - 10.5 K/uL   RBC 4.42 3.87 - 5.11 MIL/uL   Hemoglobin 13.6 12.0 - 15.0 g/dL   HCT 27.5 17.0 - 01.7 %   MCV 84.4 80.0 - 100.0 fL   MCH 30.8 26.0 - 34.0 pg   MCHC 36.5 (H) 30.0 - 36.0 g/dL   RDW 49.4 49.6 - 75.9 %   Platelets 275 150 - 400 K/uL   nRBC 0.0 0.0 - 0.2 %   Neutrophils Relative % 74 %   Neutro Abs 8.9 (H) 1.7 - 7.7 K/uL   Lymphocytes Relative 19 %   Lymphs Abs 2.3 0.7 - 4.0 K/uL   Monocytes Relative 5 %   Monocytes Absolute 0.6 0.1 - 1.0 K/uL   Eosinophils Relative 1 %   Eosinophils Absolute 0.1 0.0 - 0.5 K/uL   Basophils Relative 1 %   Basophils Absolute 0.1 0.0 - 0.1 K/uL   Immature Granulocytes 0 %   Abs Immature Granulocytes 0.04 0.00 - 0.07 K/uL  CK  Result Value Ref Range   Total CK 83 38 - 234 U/L  Magnesium  Result Value Ref Range   Magnesium 2.0 1.7 - 2.4 mg/dL  Phosphorus  Result Value Ref Range   Phosphorus 2.8 2.5 - 4.6 mg/dL      Assessment & Plan:   Problem List Items Addressed This Visit   None Visit Diagnoses     Tooth infection    -  Primary   Will treat with clindamycin and lidocaine. See dentist. Call if not getting better or getting worse.    Relevant Medications   clindamycin (CLEOCIN) 300 MG capsule   Family history of pancreatic cancer       Family member's oncologist recommended genetic testing. Referral generated today.   Relevant Orders   Ambulatory  referral to Genetics        Follow up plan: Return if symptoms  worsen or fail to improve.

## 2022-03-25 ENCOUNTER — Ambulatory Visit: Payer: Self-pay | Admitting: *Deleted

## 2022-03-25 NOTE — Telephone Encounter (Signed)
  Chief Complaint: Dentist called in 800 ibuprofen, pt has already been taking a lot of it and wants something different Symptoms: starting abd symptoms, pt taking probiotic Frequency: she is using xylocaine and taking clindamycin but needs something for pain that will not bother her stomach Pertinent Negatives: Patient denies unsure if fever because has alternated tyl, ibu, and adv for days Disposition: [] ED /[] Urgent Care (no appt availability in office) / [] Appointment(In office/virtual)/ []  [place] Virtual [facility]/ [] Home Care/ [] Refused Recommended Disposition /[] [place] Mobile [facility]/ [x]  Follow-up with PCP Additional Notes: Saw Dr Wynetta Emery this week, saw dentist today. Dentist calling in ibuprofen 800, pt worried about abd, taking probiotic. Has already taken so much ibuprofen is afraid to take more, Pt requesting something different for pain to be called in. She also said the current dentist can not see her until Jan. She said the office is working on finding something sooner.   Reason for Disposition  [1] Chipped tooth AND [2] a small piece missing  Answer Assessment - Initial Assessment Questions 1. MECHANISM: "How did the injury happen?"      Tooth injury 2. ONSET: "When did the injury happen?" (e.g., minutes or hours ago)      Broken tooth, used a at home scraper and chipped tooth 3. LOCATION: "What part of the tooth is injured?"      root 4. APPEARANCE: "What does the tooth look like?"      ugly 5. BLEEDING: "Is the mouth still bleeding?" If Yes, ask: "Is it difficult to stop?"      no 6. PAIN: "Is it painful?" If Yes, ask: "How bad is the pain?"   (Scale 1-10; or mild, moderate, severe)     9 7. TETANUS: For any breaks in the skin, ask: "When was the last tetanus booster?"     na 8. OTHER SYMPTOMS: "Do you have any other symptoms?"      no 9. PREGNANCY: "Is there any chance you are pregnant?" "When was your last menstrual period?"     no  Protocols used: Tooth  Injury-A-AH

## 2022-03-26 ENCOUNTER — Telehealth: Payer: Self-pay | Admitting: Nurse Practitioner

## 2022-03-26 DIAGNOSIS — K047 Periapical abscess without sinus: Secondary | ICD-10-CM

## 2022-03-26 NOTE — Telephone Encounter (Signed)
"  Needs alternative location for tooth extraction" I'm sorry I thought that it was carried over from the [place] but this is what the CRM stated.

## 2022-03-26 NOTE — Telephone Encounter (Signed)
Copied from [address] [id number]. Topic: Referral - Status >> Mar 25, 2022  4:40 PM Sabas Sous wrote: Reason for CRM: Pt needs a referral submitted somewhere that accepts medicaid that can see her sooner. The current location can only see her in January.

## 2022-03-27 NOTE — Telephone Encounter (Signed)
Patient is established with a dentist but would like a referral to another location that accepts Medicaid to see if she can get in sooner than January.

## 2022-03-27 NOTE — Telephone Encounter (Signed)
Referral placed.

## 2022-03-30 NOTE — Telephone Encounter (Signed)
Returned patient call and made aware of provider advise, per patient will contact dentist.

## 2022-03-30 NOTE — Telephone Encounter (Signed)
Copied from [place] [id number]. Topic: Medical Record Request - Other >> Mar 30, 2022 11:04 AM Everette C wrote: Reason for CRM: Tiwana with [place]  has called to request additional notes related to a patient's recent referral   Please submit the information via fax to [phone]   Please contact further if needed

## 2022-03-30 NOTE — Telephone Encounter (Signed)
I put in referral for a different dentist. Unfortunately I don't usually give pain medicine for dental pain- I would advise her to discuss this with her dentist.

## 2022-04-07 ENCOUNTER — Inpatient Hospital Stay: Payer: Medicaid Other

## 2022-04-07 ENCOUNTER — Inpatient Hospital Stay: Payer: Medicaid Other | Admitting: Licensed Clinical Social Worker

## 2022-04-08 ENCOUNTER — Encounter: Payer: Self-pay | Admitting: Family Medicine

## 2022-04-09 ENCOUNTER — Ambulatory Visit: Payer: Medicaid Other | Admitting: Physician Assistant

## 2022-04-09 ENCOUNTER — Encounter: Payer: Self-pay | Admitting: Physician Assistant

## 2022-04-09 VITALS — BP 105/69 | HR 70 | Temp 98.6°F | Ht 64.02 in | Wt 164.5 lb

## 2022-04-09 DIAGNOSIS — N898 Other specified noninflammatory disorders of vagina: Secondary | ICD-10-CM | POA: Diagnosis not present

## 2022-04-09 DIAGNOSIS — R82998 Other abnormal findings in urine: Secondary | ICD-10-CM | POA: Diagnosis not present

## 2022-04-09 DIAGNOSIS — B3731 Acute candidiasis of vulva and vagina: Secondary | ICD-10-CM | POA: Diagnosis not present

## 2022-04-09 LAB — URINALYSIS, ROUTINE W REFLEX MICROSCOPIC
Bilirubin, UA: NEGATIVE
Glucose, UA: NEGATIVE
Ketones, UA: NEGATIVE
Nitrite, UA: NEGATIVE
Protein,UA: NEGATIVE
Specific Gravity, UA: 1.02 (ref 1.005–1.030)
Urobilinogen, Ur: 0.2 mg/dL (ref 0.2–1.0)
pH, UA: 7 (ref 5.0–7.5)

## 2022-04-09 LAB — WET PREP FOR TRICH, YEAST, CLUE
Clue Cell Exam: NEGATIVE
Trichomonas Exam: NEGATIVE
Yeast Exam: NEGATIVE

## 2022-04-09 LAB — MICROSCOPIC EXAMINATION

## 2022-04-09 MED ORDER — NYSTATIN 100000 UNIT/GM EX CREA: 1.0000 | TOPICAL_CREAM | Freq: Two times a day (BID) | CUTANEOUS | 0 refills | Status: DC

## 2022-04-09 MED ORDER — FLUCONAZOLE 150 MG PO TABS: 150.0000 mg | ORAL_TABLET | Freq: Once | ORAL | 0 refills | Status: AC

## 2022-04-09 NOTE — Progress Notes (Signed)
Acute Office Visit   Patient: Alyssa Kemp   DOB: 27-Apr-1991   31 y.o. Female  MRN: [id number] Visit Date: 04/09/2022  Today's healthcare provider: Dani Gobble Daffney Greenly, PA-C  Introduced myself to the patient as a Journalist, newspaper and provided education on APPs in clinical practice.    Chief Complaint  Patient presents with   Vaginal Discharge    Just recently stopped Clindamycin on Tuesday   Subjective    Vaginal Discharge The patient's primary symptoms include vaginal discharge. Pertinent negatives include no chills, dysuria, fever or flank pain.   HPI     Vaginal Discharge    Additional comments: Just recently stopped Clindamycin on Tuesday      Last edited by Jerelene Redden, CMA on 04/09/2022  3:32 PM.      Reports she has had this ongoing Monday  She has been on two rounds of Clindamycin and thinks this kicked it off She has had a yeast infection in the past and this feels similar Denies concerns for STDs but admits her partner is out of town a lot  Reports she is having itchiness and irritation with wiping when using the restroom     Medications: Outpatient Medications Prior to Visit  Medication Sig   cetirizine (ZYRTEC) 10 MG tablet TAKE 1 TABLET(10 MG) BY MOUTH DAILY   EPINEPHrine (EPIPEN 2-PAK) 0.3 mg/0.3 mL IJ SOAJ injection Inject 0.3 mLs (0.3 mg total) into the muscle as needed for anaphylaxis.   lidocaine (XYLOCAINE) 2 % solution Use as directed 15 mLs in the mouth or throat every 3 (three) hours as needed for mouth pain.   Multiple Vitamin (MULTIVITAMINS PO) Take by mouth daily.   omeprazole (PRILOSEC) 20 MG capsule TAKE 1 CAPSULE(20 MG) BY MOUTH DAILY   [DISCONTINUED] busPIRone (BUSPAR) 5 MG tablet Take 5 mg by mouth daily. (Patient not taking: Reported on 03/20/2022)   [DISCONTINUED] clindamycin (CLEOCIN) 300 MG capsule Take 1 capsule (300 mg total) by mouth 3 (three) times daily.   [DISCONTINUED] lidocaine (XYLOCAINE) 5 % ointment Apply 1 Application  topically as needed. (Patient not taking: Reported on 03/20/2022)   [DISCONTINUED] oxyCODONE-acetaminophen (PERCOCET) 5-325 MG tablet Take 1 tablet by mouth every 4 (four) hours as needed for severe pain. (Patient not taking: Reported on 03/20/2022)   [DISCONTINUED] promethazine-dextromethorphan (PROMETHAZINE-DM) 6.25-15 MG/5ML syrup Take 5 mLs by mouth 3 (three) times daily as needed for cough. (Patient not taking: Reported on 03/20/2022)   [DISCONTINUED] Selenium (SELENIMIN PO) Take by mouth. (Patient not taking: Reported on 03/20/2022)   No facility-administered medications prior to visit.    Review of Systems  Constitutional:  Negative for chills and fever.  Genitourinary:  Positive for vaginal discharge. Negative for dysuria, flank pain, vaginal bleeding and vaginal pain.       Objective    BP 105/69   Pulse 70   Temp 98.6 F (37 C) (Oral)   Ht 5' 4.02" (1.626 m)   Wt 164 lb 8 oz (74.6 kg)   SpO2 99%   BMI 28.22 kg/m    Physical Exam Vitals reviewed.  Constitutional:      General: She is awake.     Appearance: Normal appearance. She is well-developed and well-groomed.  HENT:     Head: Normocephalic and atraumatic.  Eyes:     General: Lids are normal. Gaze aligned appropriately.     Extraocular Movements: Extraocular movements intact.     Conjunctiva/sclera: Conjunctivae normal.  Pulmonary:  Effort: Pulmonary effort is normal.  Genitourinary:    Comments: Patient had her 3 children in the room- after shared decision -making we decided to use results from wet prep and forgo genital exam today Neurological:     Mental Status: She is alert.  Psychiatric:        Attention and Perception: Attention and perception normal.        Mood and Affect: Mood and affect normal.        Speech: Speech normal.        Behavior: Behavior normal. Behavior is cooperative.       Results for orders placed or performed in visit on 04/09/22  WET PREP FOR TRICH, YEAST, CLUE   Specimen:  Sterile Swab   Sterile Swab  Result Value Ref Range   Trichomonas Exam Negative Negative   Yeast Exam Negative Negative   Clue Cell Exam Negative Negative  Microscopic Examination   Urine  Result Value Ref Range   WBC, UA 0-5 0 - 5 /hpf   RBC, Urine 0-2 0 - 2 /hpf   Epithelial Cells (non renal) 0-10 0 - 10 /hpf   Bacteria, UA Few None seen/Few  Urinalysis, Routine w reflex microscopic  Result Value Ref Range   Specific Gravity, UA 1.020 1.005 - 1.030   pH, UA 7.0 5.0 - 7.5   Color, UA Yellow Yellow   Appearance Ur Clear Clear   Leukocytes,UA 1+ (A) Negative   Protein,UA Negative Negative/Trace   Glucose, UA Negative Negative   Ketones, UA Negative Negative   RBC, UA 2+ (A) Negative   Bilirubin, UA Negative Negative   Urobilinogen, Ur 0.2 0.2 - 1.0 mg/dL   Nitrite, UA Negative Negative   Microscopic Examination See below:     Assessment & Plan      No follow-ups on file.      Problem List Items Addressed This Visit       Genitourinary   Vaginal yeast infection    Acute, recurrent concern Patient reports she has just finished 2 rounds of abx and his having irritation and itchiness in groin area, consistent with previous yeast infections Will provide Diflucan to assist with symptoms and nystatin cream for skin irritation UA was positive for leukocytes so will send for culture to rule out UTI  Results to dictate further management  Follow up as needed for persistent or progressing symptoms       Relevant Medications   nystatin cream (MYCOSTATIN)   Other Visit Diagnoses     Vaginal discharge    -  Primary Acute, recurrent concern See vaginal yeast infection A&P for management plan    Relevant Orders   Urinalysis, Routine w reflex microscopic (Completed)   WET PREP FOR TRICH, YEAST, CLUE (Completed)   Urine leukocytes       Relevant Orders   Urine Culture        No follow-ups on file.   I, Crist Kruszka E Branson Kranz, PA-C, have reviewed all documentation for this  visit. The documentation on 04/10/22 for the exam, diagnosis, procedures, and orders are all accurate and complete.   Jacquelin Hawking, MHS, PA-C [facility]

## 2022-04-10 ENCOUNTER — Ambulatory Visit: Payer: Self-pay

## 2022-04-10 DIAGNOSIS — B3731 Acute candidiasis of vulva and vagina: Secondary | ICD-10-CM | POA: Insufficient documentation

## 2022-04-10 NOTE — Telephone Encounter (Signed)
Patient notified of Erin's message.

## 2022-04-10 NOTE — Telephone Encounter (Signed)
There is potential for a UTI so we can send in a sample for culture. Once the results come back we can decide if an antibiotic is needed based on the results.

## 2022-04-10 NOTE — Telephone Encounter (Signed)
Patient called back- states she has had some soreness in her back- does she need urine cultured to rule out UTI. Patient advised I would sned question for review.

## 2022-04-10 NOTE — Telephone Encounter (Signed)
  Chief Complaint: advice Symptoms: vaginal discharge  Frequency: several days Pertinent Negatives: NA Disposition: [] ED /[] Urgent Care (no appt availability in office) / [] Appointment(In office/virtual)/ []  [place] [facility]/ [x] Home Care/ [] Refused Recommended Disposition /[] [place] Mobile [facility]/ []  Follow-up with PCP Additional Notes: pt had questions if Monistat would affect yeast coming back negative. Advised pt to complete Diflucan and Nystatin and if still having sx to call back. Pt asked about urine and advised her that Santiago Glad, NP had not reviewed results yet and if any recommendations for that, we would give her a call back. Pt verbalized understanding.   Summary: Yeast advice   Pt is calling to ask her yeast was negative. Pt used Monostat could that have made the yeast negative. Would this mean that she need additional testing      Reason for Disposition  Health Information question, no triage required and triager able to answer question  Answer Assessment - Initial Assessment Questions 1. REASON FOR CALL or QUESTION: "What is your reason for calling today?" or "How can I best help you?" or "What question do you have that I can help answer?"     Wanted to see if she needed additional testing.  Protocols used: Information Only Call - No Triage-A-AH

## 2022-04-10 NOTE — Assessment & Plan Note (Signed)
Acute, recurrent concern Patient reports she has just finished 2 rounds of abx and his having irritation and itchiness in groin area, consistent with previous yeast infections Will provide Diflucan to assist with symptoms and nystatin cream for skin irritation UA was positive for leukocytes so will send for culture to rule out UTI  Results to dictate further management  Follow up as needed for persistent or progressing symptoms

## 2022-04-14 ENCOUNTER — Telehealth: Payer: Self-pay | Admitting: Nurse Practitioner

## 2022-04-14 ENCOUNTER — Telehealth: Payer: Self-pay | Admitting: *Deleted

## 2022-04-14 NOTE — Telephone Encounter (Signed)
Her UA was overall pretty clear but if she is still having urinary symptoms she can come by and leave a urine sample for [place] to send out for a culture.

## 2022-04-14 NOTE — Telephone Encounter (Signed)
Results and provider comments given to pt. Questions answered, verbalized understanding.  Append Comments   Seen Back to Top   Will send sample for urine culture. Results to dictate further management Will send Abx once culture returns so we can make sure we have the right antibiotic if bacteria is present   Pt now started her period and is wanting to know if you can go ahead and send in an antibiotic and then change it if it is not the best one by the culture. Unclear if culture has been started by note??

## 2022-04-14 NOTE — Telephone Encounter (Signed)
Copied from [place] [id number]. Topic: General - Other >> Apr 14, 2022 12:03 PM Leitha Schuller wrote: Pt checking status of 10-20 lab results  Pt states urine was to be sent for an extended culture  Please fu w/ pt

## 2022-04-14 NOTE — Telephone Encounter (Signed)
Called patient to discuss lab results.  No answer. HIPAA compliant vm left requesting return call.  Ok for nurse triage to give results if patient calls back.    

## 2022-04-14 NOTE — Telephone Encounter (Signed)
Patient received results from UA that was done in office on 10/19, you were going to send in for Culture but urine was never sent in. Please advise

## 2022-04-15 ENCOUNTER — Other Ambulatory Visit: Payer: Self-pay | Admitting: Physician Assistant

## 2022-04-15 DIAGNOSIS — R82998 Other abnormal findings in urine: Secondary | ICD-10-CM

## 2022-04-15 NOTE — Telephone Encounter (Signed)
I'll place an order for the urine culture but the antibiotic will need to wait until the culture results come back. She just finished a round of Clindamycin so I am doubtful that she has a UTI at this time.

## 2022-04-15 NOTE — Telephone Encounter (Signed)
Patient made aware of results and verbalized understanding.   Patient will be in on Friday to leave a urine sample.

## 2022-04-17 ENCOUNTER — Other Ambulatory Visit: Payer: Medicaid Other

## 2022-04-17 ENCOUNTER — Ambulatory Visit: Payer: Medicaid Other

## 2022-04-17 DIAGNOSIS — R82998 Other abnormal findings in urine: Secondary | ICD-10-CM

## 2022-04-21 LAB — URINE CULTURE

## 2022-04-27 DIAGNOSIS — Z20822 Contact with and (suspected) exposure to covid-19: Secondary | ICD-10-CM | POA: Diagnosis not present

## 2022-04-27 DIAGNOSIS — J069 Acute upper respiratory infection, unspecified: Secondary | ICD-10-CM | POA: Diagnosis not present

## 2022-05-04 ENCOUNTER — Emergency Department (HOSPITAL_COMMUNITY): Payer: Medicaid Other

## 2022-05-04 ENCOUNTER — Encounter (HOSPITAL_COMMUNITY): Payer: Self-pay

## 2022-05-04 ENCOUNTER — Emergency Department (HOSPITAL_COMMUNITY)
Admission: EM | Admit: 2022-05-04 | Discharge: 2022-05-04 | Disposition: A | Discharge status: Home / Self Care | Payer: Medicaid Other | Attending: Emergency Medicine | Admitting: Emergency Medicine

## 2022-05-04 ENCOUNTER — Other Ambulatory Visit: Payer: Self-pay

## 2022-05-04 DIAGNOSIS — J069 Acute upper respiratory infection, unspecified: Secondary | ICD-10-CM | POA: Diagnosis not present

## 2022-05-04 DIAGNOSIS — R059 Cough, unspecified: Secondary | ICD-10-CM | POA: Diagnosis not present

## 2022-05-04 DIAGNOSIS — Z1152 Encounter for screening for COVID-19: Secondary | ICD-10-CM | POA: Diagnosis not present

## 2022-05-04 LAB — RESP PANEL BY RT-PCR (FLU A&B, COVID) ARPGX2
Influenza A by PCR: NEGATIVE
Influenza B by PCR: NEGATIVE
SARS Coronavirus 2 by RT PCR: NEGATIVE

## 2022-05-04 MED ORDER — ALBUTEROL SULFATE HFA 108 (90 BASE) MCG/ACT IN AERS: 1.0000 | INHALATION_SPRAY | Freq: Four times a day (QID) | RESPIRATORY_TRACT | 0 refills | Status: DC | PRN

## 2022-05-04 MED ORDER — METHYLPREDNISOLONE 4 MG PO TBPK: ORAL_TABLET | Freq: Every day | ORAL | 0 refills | Status: DC

## 2022-05-04 MED ORDER — METHYLPREDNISOLONE 4 MG PO TBPK: ORAL_TABLET | Freq: Every day | ORAL | 0 refills | Status: AC

## 2022-05-04 MED ORDER — AZITHROMYCIN 250 MG PO TABS: 250.0000 mg | ORAL_TABLET | Freq: Every day | ORAL | 0 refills | Status: DC

## 2022-05-04 NOTE — ED Provider Triage Note (Signed)
Emergency Medicine Provider Triage Evaluation Note  Alyssa Kemp , a 31 y.o. female  was evaluated in triage.  Pt complains of productive cough for almost two weeks.  Review of Systems  Positive:  Negative:   Physical Exam  BP 126/75   Pulse 80   Temp 98.3 F (36.8 C) (Oral)   Resp 16   Ht 5\' 4"  (1.626 m)   Wt 74.4 kg   LMP 04/13/2022 (Exact Date)   SpO2 98%   BMI 28.15 kg/m  Gen:   Awake, no distress   Resp:  Normal effort  MSK:   Moves extremities without difficulty  Other:  CTAB  Medical Decision Making  Medically screening exam initiated at 8:18 AM.  Appropriate orders placed.  Alyssa Kemp was informed that the remainder of the evaluation will be completed by another provider, this initial triage assessment does not replace that evaluation, and the importance of remaining in the ED until their evaluation is complete.  COVID and CXR ordered   Alyssa Kemp, Alyssa Kemp 05/04/22 05/06/22

## 2022-05-04 NOTE — ED Provider Notes (Signed)
[facility]-EMERGENCY DEPT Provider Note   CSN: [id number] Arrival date & time: 05/04/22  3614     History {Add pertinent medical, surgical, social history, OB history to HPI:1} Chief Complaint  Patient presents with   Cough    Alyssa Kemp is a 31 y.o. female.   Cough      Home Medications Prior to Admission medications   Medication Sig Start Date End Date Taking? Authorizing Provider  albuterol (VENTOLIN HFA) 108 (90 Base) MCG/ACT inhaler Inhale 1-2 puffs into the lungs every 6 (six) hours as needed for wheezing or shortness of breath. 05/04/22  Yes Achille Rich, PA-C  azithromycin (ZITHROMAX) 250 MG tablet Take 1 tablet (250 mg total) by mouth daily. Take first 2 tablets together, then 1 every day until finished. 05/04/22  Yes Achille Rich, PA-C  methylPREDNISolone (MEDROL DOSEPAK) 4 MG TBPK tablet Take by mouth daily for 6 days. Taper over 6 days 05/04/22 05/10/22 Yes Achille Rich, PA-C  cetirizine (ZYRTEC) 10 MG tablet TAKE 1 TABLET(10 MG) BY MOUTH DAILY 08/14/21   Larae Grooms, NP  EPINEPHrine (EPIPEN 2-PAK) 0.3 mg/0.3 mL IJ SOAJ injection Inject 0.3 mLs (0.3 mg total) into the muscle as needed for anaphylaxis. 12/15/19   Particia Nearing, PA-C  lidocaine (XYLOCAINE) 2 % solution Use as directed 15 mLs in the mouth or throat every 3 (three) hours as needed for mouth pain. 03/20/22   Olevia Perches P, DO  Multiple Vitamin (MULTIVITAMINS PO) Take by mouth daily.    [provider]  nystatin cream (MYCOSTATIN) Apply 1 Application topically 2 (two) times daily. 04/09/22   Mecum, Erin E, PA-C  omeprazole (PRILOSEC) 20 MG capsule TAKE 1 CAPSULE(20 MG) BY MOUTH DAILY 08/14/21   Larae Grooms, NP      Allergies    Sulfacetamide sodium, Sulfasalazine, Tussionex pennkinetic er [hydrocod poli-chlorphe poli er], Doxycycline, Vyvanse [lisdexamfetamine], Ciprofloxacin, Macrobid [nitrofurantoin], and Amoxicillin-pot clavulanate    Review  of Systems   Review of Systems  Respiratory:  Positive for cough.     Physical Exam Updated Vital Signs BP 112/73   Pulse 71   Temp 98.3 F (36.8 C) (Oral)   Resp 16   Ht 5\' 4"  (1.626 m)   Wt 74.4 kg   LMP 04/13/2022 (Exact Date)   SpO2 100%   BMI 28.15 kg/m  Physical Exam  ED Results / Procedures / Treatments   Labs (all labs ordered are listed, but only abnormal results are displayed) Labs Reviewed  RESP PANEL BY RT-PCR (FLU A&B, COVID) ARPGX2    EKG None  Radiology DG Chest 2 View  Result Date: 05/04/2022 CLINICAL DATA:  Productive cough. EXAM: CHEST - 2 VIEW COMPARISON:  September 07, 2018. FINDINGS: The heart size and mediastinal contours are within normal limits. Both lungs are clear. The visualized skeletal structures are unremarkable. IMPRESSION: No active cardiopulmonary disease. Electronically Signed   By: September 09, 2018 M.D.   On: 05/04/2022 08:17    Procedures Procedures  {Document cardiac monitor, telemetry assessment procedure when appropriate:1}  Medications Ordered in ED Medications - No data to display  ED Course/ Medical Decision Making/ A&P                           Medical Decision Making Amount and/or Complexity of Data Reviewed Radiology: ordered.  Risk Prescription drug management.   ***  {Document critical care time when appropriate:1} {Document review of labs and clinical decision tools  ie heart score, Chads2Vasc2 etc:1}  {Document your independent review of radiology images, and any outside records:1} {Document your discussion with family members, caretakers, and with consultants:1} {Document social determinants of health affecting pt's care:1} {Document your decision making why or why not admission, treatments were needed:1} Final Clinical Impression(s) / ED Diagnoses Final diagnoses:  Upper respiratory tract infection, unspecified type    Rx / DC Orders ED Discharge Orders          Ordered    azithromycin (ZITHROMAX) 250  MG tablet  Daily        05/04/22 1258    methylPREDNISolone (MEDROL DOSEPAK) 4 MG TBPK tablet  Daily        05/04/22 1258    albuterol (VENTOLIN HFA) 108 (90 Base) MCG/ACT inhaler  Every 6 hours PRN        05/04/22 1258

## 2022-05-04 NOTE — ED Triage Notes (Signed)
Patient c/o a productive cough with yellow sputum more than a week. Patient states she has been to an UC and has been taking OTC chest congestion and cough medicine with no relief.

## 2022-05-04 NOTE — Discharge Instructions (Signed)
You were seen in the ER for evaluation of your cough and cold symptoms. Because of your duration of symptoms, I am starting you on an antibiotic and steroid. Please follow the directions. I am also sending you in an inhaler to use as need for cough. If you start having any blood in your cough, worsening chest pain, fevers, shortness or breath, please return to the ER for evaluation.   Contact a doctor if: You are getting worse, not better. You have any of these: A fever or chills. Brown or red mucus in your nose. Yellow or brown fluid (discharge)coming from your nose. Pain in your face, especially when you bend forward. Swollen neck glands. Pain when you swallow. White areas in the back of your throat. Get help right away if: You have shortness of breath that gets worse. You have very bad or constant: Headache. Ear pain. Pain in your forehead, behind your eyes, and over your cheekbones (sinus pain). Chest pain. You have long-lasting (chronic) lung disease along with any of these: Making high-pitched whistling sounds when you breathe, most often when you breathe out (wheezing). Long-lasting cough (more than 14 days). Coughing up blood. A change in your usual mucus. You have a stiff neck. You have changes in your: Vision. Hearing. Thinking. Mood. These symptoms may be an emergency. Get help right away. Call 911. Do not wait to see if the symptoms will go away. Do not drive yourself to the hospital.

## 2022-05-11 ENCOUNTER — Ambulatory Visit: Payer: Medicaid Other | Admitting: Physician Assistant

## 2022-05-11 ENCOUNTER — Encounter: Payer: Self-pay | Admitting: Physician Assistant

## 2022-05-11 VITALS — BP 107/72 | HR 84 | Temp 98.4°F | Wt 164.0 lb

## 2022-05-11 DIAGNOSIS — R051 Acute cough: Secondary | ICD-10-CM | POA: Diagnosis not present

## 2022-05-11 MED ORDER — BENZONATATE 200 MG PO CAPS: 200.0000 mg | ORAL_CAPSULE | Freq: Two times a day (BID) | ORAL | 0 refills | Status: DC | PRN

## 2022-05-11 NOTE — Progress Notes (Signed)
Acute Office Visit   Patient: Alyssa Kemp   DOB: 24-Apr-1991   31 y.o. Female  MRN: [id number] Visit Date: 05/11/2022  Today's healthcare provider: Oswaldo Conroy Saylee Sherrill, PA-C  Introduced myself to the patient as a Secondary school teacher and provided education on APPs in clinical practice.    Chief Complaint  Patient presents with   Cough    Patient states she has been coughing since the first week of November, has tried delsym and robitussin. Was seen at Urgent care and given abx and steroids, helped some but cough is still there.    Subjective    Cough   HPI     Cough    Additional comments: Patient states she has been coughing since the first week of November, has tried delsym and robitussin. Was seen at Urgent care and given abx and steroids, helped some but cough is still there.       Last edited by Rolley Sims, CMA on 05/11/2022 11:26 AM.       URI  Reports ongoing cough since the beginning of Nov 3rd  Was provided Z pack, Medrol dose pack and Ventolin inhaler at ED on 05/04/22   States her symptoms started with body aches, chills, congestion and cough She was tested for COVID and Flu - both negative Reports she was trying OTC medications without relief  Then went to the ED on 05/04/22 - states she is not able to stop coughing  Reports Robitussin makes her drowsy but helps with the cough  Reports she is having mildly productive cough but sputum is becoming more thin at this time She has finished her steroids  She has been using her inhaler at work - reports it relieved the chest tightness       Medications: Outpatient Medications Prior to Visit  Medication Sig   albuterol (VENTOLIN HFA) 108 (90 Base) MCG/ACT inhaler Inhale 1-2 puffs into the lungs every 6 (six) hours as needed for wheezing or shortness of breath.   azithromycin (ZITHROMAX) 250 MG tablet Take 1 tablet (250 mg total) by mouth daily. Take first 2 tablets together, then 1 every day until finished.    cetirizine (ZYRTEC) 10 MG tablet TAKE 1 TABLET(10 MG) BY MOUTH DAILY   EPINEPHrine (EPIPEN 2-PAK) 0.3 mg/0.3 mL IJ SOAJ injection Inject 0.3 mLs (0.3 mg total) into the muscle as needed for anaphylaxis.   Multiple Vitamin (MULTIVITAMINS PO) Take by mouth daily.   nystatin cream (MYCOSTATIN) Apply 1 Application topically 2 (two) times daily.   omeprazole (PRILOSEC) 20 MG capsule TAKE 1 CAPSULE(20 MG) BY MOUTH DAILY   [DISCONTINUED] lidocaine (XYLOCAINE) 2 % solution Use as directed 15 mLs in the mouth or throat every 3 (three) hours as needed for mouth pain.   No facility-administered medications prior to visit.    Review of Systems  Respiratory:  Positive for cough.        Objective    BP 107/72   Pulse 84   Temp 98.4 F (36.9 C)   Wt 164 lb (74.4 kg)   LMP 04/13/2022 (Exact Date)   SpO2 97%   BMI 28.15 kg/m    Physical Exam Vitals reviewed.  Constitutional:      General: She is awake.     Appearance: Normal appearance. She is well-developed and well-groomed.  HENT:     Head: Normocephalic and atraumatic.  Cardiovascular:     Rate and Rhythm: Normal rate and regular rhythm.  Pulses: Normal pulses.          Radial pulses are 2+ on the right side and 2+ on the left side.     Heart sounds: Normal heart sounds.  Pulmonary:     Effort: Pulmonary effort is normal.     Breath sounds: Normal breath sounds. No decreased air movement. No decreased breath sounds, wheezing, rhonchi or rales.  Lymphadenopathy:     Head:     Right side of head: No submental, submandibular or preauricular adenopathy.     Left side of head: No submental, submandibular or preauricular adenopathy.     Cervical:     Right cervical: No superficial or posterior cervical adenopathy.    Left cervical: No superficial or posterior cervical adenopathy.  Neurological:     General: No focal deficit present.     Mental Status: She is alert and oriented to person, place, and time.  Psychiatric:        Mood  and Affect: Mood normal.        Behavior: Behavior normal. Behavior is cooperative.        Thought Content: Thought content normal.        Judgment: Judgment normal.       No results found for any visits on 05/11/22.  Assessment & Plan      No follow-ups on file.       Problem List Items Addressed This Visit   None Visit Diagnoses     Acute cough    -  Primary Acute, ongoing concern Reports she is feeling better but has persistent cough despite finishing steroids Recommend she finish Z pack and will start tessalon perls as needed for coughing She can continue OTC cough medications as desired  Reviewed CXR results with her - no signs of acute cardiopulmonary disease  Recommend using Albuterol inhaler PRN for coughing fits and SOB  Follow up as needed for persistent or progressing symptoms     Relevant Medications   benzonatate (TESSALON) 200 MG capsule        No follow-ups on file.   I, Angeline Trick E Ouita Nish, PA-C, have reviewed all documentation for this visit. The documentation on 05/11/22 for the exam, diagnosis, procedures, and orders are all accurate and complete.   Jacquelin Hawking, MHS, PA-C [facility]

## 2022-05-12 ENCOUNTER — Inpatient Hospital Stay: Payer: Medicaid Other | Attending: Oncology | Admitting: Licensed Clinical Social Worker

## 2022-05-12 ENCOUNTER — Encounter: Payer: Self-pay | Admitting: Licensed Clinical Social Worker

## 2022-05-12 ENCOUNTER — Inpatient Hospital Stay: Payer: Medicaid Other

## 2022-05-12 DIAGNOSIS — Z8042 Family history of malignant neoplasm of prostate: Secondary | ICD-10-CM

## 2022-05-12 DIAGNOSIS — Z800 Family history of malignant neoplasm of digestive organs: Secondary | ICD-10-CM | POA: Diagnosis not present

## 2022-05-12 NOTE — Progress Notes (Signed)
REFERRING PROVIDER: Valerie Roys, DO [place],  [place] [id number]  PRIMARY PROVIDER:  Jon Billings, NP  PRIMARY REASON FOR VISIT:  1. Family history of pancreatic cancer   2. Family history of stomach cancer   3. Family history of prostate cancer    I connected with Alyssa Kemp on 05/12/2022 at 3:00 PM EDT by MyChart video conference and verified that I am speaking with the correct person using two identifiers.    Patient location: home Provider location: [place]:   Alyssa Kemp, a 31 y.o. female, was seen for a [place] cancer genetics consultation at the request of Dr. Wynetta Emery due to a family history of cancer.  Alyssa Kemp presents to clinic today to discuss the possibility of a hereditary predisposition to cancer, genetic testing, and to further clarify her future cancer risks, as well as potential cancer risks for family members.   CANCER HISTORY:  Alyssa Kemp is a 31 y.o. female with no personal history of cancer.    RISK FACTORS:  Menarche was at age 81.  First live birth at age 39.  OCP use: short time Ovaries intact: yes.  Hysterectomy: no.  Menopausal status: premenopausal.  HRT use: 0 years. Colonoscopy: no; not examined. Mammogram within the last year: no. Number of breast biopsies: 0. Up to date with pelvic exams: yes.  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  Significant diagnoses are listed below: Family History  Problem Relation Age of Onset   Cervical cancer Mother    Pancreatic cancer Maternal Aunt        dx late 29s   Hodgkin's lymphoma Paternal Aunt        dx 52s   Hypertension Maternal Grandmother    Pancreatic cancer Maternal Grandmother 70   Diabetes Maternal Grandmother    Coronary artery disease Maternal Grandfather    Congestive Heart Failure Maternal Grandfather    Hypertension Maternal Grandfather    Prostate cancer Maternal Grandfather        dx 70s   Diabetes Maternal Grandfather    Hypertension Paternal Grandmother    Diabetes Paternal Grandmother    Heart disease Paternal Grandfather    Hypertension Paternal Grandfather    Stomach cancer Paternal Great-grandmother    Lung cancer Paternal Great-grandfather    Stomach cancer Maternal Great-grandfather    Alyssa Kemp has 2 daughters and 1 son. She has 1 maternal half brother.  Alyssa Kemp mother had skin cancer and cervical cancer, she is living at 76.  Maternal aunt was recently diagnosed with pancreatic cancer in her late 84s. Her other aunt has had skin cancer. Maternal grandmother also had pancreatic cancer and passed from it at 67, her father had stomach cancer. Patient's maternal grandfather had prostate cancer in his 57s.  Alyssa Kemp father passed at 76 due to heart issues. Paternal aunt had Hodgkin's lymphoma in her 70s. Her paternal great grandmother had stomach cancer, great grandfather had lung cancer.   Alyssa Kemp is unaware of previous family history of genetic testing for hereditary cancer risks. There is no reported  Ashkenazi Jewish ancestry. There is no known consanguinity.    GENETIC COUNSELING ASSESSMENT: Alyssa Kemp is a 31 y.o. female with a family history of pancreatic cancer which is somewhat suggestive of a hereditary cancer syndrome and predisposition to cancer. We, therefore, discussed and recommended the following at today's visit.   DISCUSSION: We discussed that approximately 10% of pancreatic cancer is hereditary. Most cases of hereditary pancreatic cancer are associated with BRCA1/BRCA2 genes, although there are other genes associated with hereditary cancer as well. Cancers and risks are gene specific. We discussed that testing is beneficial for several reasons including knowing about cancer risks, identifying potential screening and risk-reduction options that may be appropriate, and to understand if other family members could be at risk for cancer and allow them to undergo genetic testing.   We reviewed the characteristics, features and inheritance patterns of hereditary cancer syndromes. We also discussed genetic testing, including the appropriate family members to test, the process of testing, insurance coverage and turn-around-time for results. We discussed the implications of a negative, positive and/or variant of uncertain significant result. We recommended Alyssa Kemp pursue genetic testing for the [facility] Multi-Cancer+RNA gene panel.   Based on Alyssa Kemp's family history of cancer, she meets medical criteria for genetic testing. Despite that she meets criteria, she may still have an out of pocket cost. We discussed that if her out of pocket cost for testing is over $100, the laboratory will call and confirm whether she wants to proceed with testing.  If the out of pocket cost of testing is less than $100 she will be billed by the genetic testing laboratory.   PLAN: After considering the risks, benefits, and limitations, Alyssa Kemp provided informed consent to pursue genetic testing. She will come  in for a blood draw on 12/8 at 3:45 and the blood sample will be sent to [facility] for analysis of the Multi-Cancer+RNA panel. Results should be available within approximately 2-3 weeks' time, at which point they will be disclosed by telephone to Alyssa Kemp, as will any additional recommendations warranted by these results. Alyssa Kemp will receive a summary of her genetic counseling visit and a copy of her results once available. This information will also be available in Epic.   Alyssa Kemp questions were answered to her satisfaction today. Our contact information was provided should additional questions or concerns arise. Thank you for the referral and allowing [place] to share in the care of your patient.   Faith Rogue, MS, [facility] Genetic Counselor [email or web address] Phone: [phone]  The patient was seen for a total of 25 minutes in virtual genetic counseling.  Dr. Grayland Ormond was available for discussion regarding this case.   _______________________________________________________________________ For Office Staff:  Number of people involved in session: 1 Was an Intern/ student involved with case: no

## 2022-05-29 ENCOUNTER — Inpatient Hospital Stay: Payer: Medicaid Other | Attending: Oncology

## 2022-05-29 DIAGNOSIS — Z8042 Family history of malignant neoplasm of prostate: Secondary | ICD-10-CM | POA: Diagnosis not present

## 2022-05-29 DIAGNOSIS — Z800 Family history of malignant neoplasm of digestive organs: Secondary | ICD-10-CM | POA: Diagnosis not present

## 2022-06-05 ENCOUNTER — Other Ambulatory Visit: Payer: Self-pay | Admitting: Nurse Practitioner

## 2022-06-05 NOTE — Telephone Encounter (Signed)
Requested Prescriptions  Pending Prescriptions Disp Refills   omeprazole (PRILOSEC) 20 MG capsule [Pharmacy Med Name: OMEPRAZOLE 20MG  CAPSULES] 90 capsule 0    Sig: TAKE 1 CAPSULE(20 MG) BY MOUTH DAILY     Gastroenterology: Proton Pump Inhibitors Passed - 06/05/2022  3:07 AM      Passed - Valid encounter within last 12 months    Recent Outpatient Visits           3 weeks ago Acute cough   [facility] Mecum, 06/07/2022, PA-C   1 month ago Vaginal discharge   [facility] Mecum, Oswaldo Conroy, PA-C   2 months ago Tooth infection   Oswaldo Conroy, Megan P, DO   7 months ago Labia irritation   [facility] Vigg, Avanti, MD   9 months ago Menstrual abnormality   [facility] [place] Vigg, Avanti, MD

## 2022-06-09 ENCOUNTER — Ambulatory Visit: Payer: Self-pay | Admitting: Licensed Clinical Social Worker

## 2022-06-09 ENCOUNTER — Telehealth: Payer: Self-pay | Admitting: Licensed Clinical Social Worker

## 2022-06-09 ENCOUNTER — Encounter: Payer: Self-pay | Admitting: Licensed Clinical Social Worker

## 2022-06-09 DIAGNOSIS — Z1379 Encounter for other screening for genetic and chromosomal anomalies: Secondary | ICD-10-CM

## 2022-06-09 NOTE — Progress Notes (Signed)
HPI:   Alyssa Kemp was previously seen in the [facility] due to a family history of cancer and concerns regarding a hereditary predisposition to cancer. Please refer to our prior cancer genetics clinic note for more information regarding our discussion, assessment and recommendations, at the time. Alyssa Kemp recent genetic test results were disclosed to her, as were recommendations warranted by these results. These results and recommendations are discussed in more detail below.  CANCER HISTORY:  Oncology History   No history exists.    FAMILY HISTORY:  We obtained a detailed, 4-generation family history.  Significant diagnoses are listed below: Family History  Problem Relation Age of Onset   Cervical cancer Mother    Pancreatic cancer Maternal Aunt        dx late 2s   Hodgkin's lymphoma Paternal Aunt        dx 26s   Hypertension Maternal Grandmother    Pancreatic cancer Maternal Grandmother 70   Diabetes Maternal Grandmother    Coronary artery disease Maternal Grandfather    Congestive Heart Failure Maternal Grandfather    Hypertension Maternal Grandfather    Prostate cancer Maternal Grandfather        dx 53s   Diabetes Maternal Grandfather    Hypertension Paternal Grandmother    Diabetes Paternal Grandmother    Heart disease Paternal Grandfather    Hypertension Paternal Grandfather    Stomach cancer Paternal Great-grandmother    Lung cancer Paternal Great-grandfather    Stomach cancer Maternal Great-grandfather    Ms. Neuwirth has 2 daughters and 1 son. She has 1 maternal half brother.   Ms. Loa mother had skin cancer and cervical cancer, she is living at 14. Maternal aunt was recently diagnosed with pancreatic cancer in her late 63s. Her other aunt has had skin cancer. Maternal grandmother also had pancreatic cancer and passed from it at 44, her father had stomach cancer. Patient's maternal grandfather had prostate cancer in his 63s.   Ms. Rabadan  father passed at 83 due to heart issues. Paternal aunt had Hodgkin's lymphoma in her 62s. Her paternal great grandmother had stomach cancer, great grandfather had lung cancer.    Ms. Fickling is unaware of previous family history of genetic testing for hereditary cancer risks. There is no reported Ashkenazi Jewish ancestry. There is no known consanguinity.      GENETIC TEST RESULTS:  The [facility] Multi-Cancer+RNA panel Panel found no pathogenic mutations.   The Multi-Cancer + RNA Panel offered by [facility] includes sequencing and/or deletion/duplication analysis of the following 70 genes:  AIP*, ALK, APC*, ATM*, AXIN2*, BAP1*, BARD1*, BLM*, BMPR1A*, BRCA1*, BRCA2*, BRIP1*, CDC73*, CDH1*, CDK4, CDKN1B*, CDKN2A, CHEK2*, CTNNA1*, DICER1*, EPCAM, EGFR, FH*, FLCN*, GREM1, HOXB13, KIT, LZTR1, MAX*, MBD4, MEN1*, MET, MITF, MLH1*, MSH2*, MSH3*, MSH6*, MUTYH*, NF1*, NF2*, NTHL1*, PALB2*, PDGFRA, PMS2*, POLD1*, POLE*, POT1*, PRKAR1A*, PTCH1*, PTEN*, RAD51C*, RAD51D*, RB1*, RET, SDHA*, SDHAF2*, SDHB*, SDHC*, SDHD*, SMAD4*, SMARCA4*, SMARCB1*, SMARCE1*, STK11*, SUFU*, TMEM127*, TP53*, TSC1*, TSC2*, VHL*. RNA analysis is performed for * genes.   The test report has been scanned into EPIC and is located under the Molecular Pathology section of the Results Review tab.  A portion of the result report is included below for reference. Genetic testing reported out on 06/08/2022.     Even though a pathogenic variant was not identified, possible explanations for the cancer in the family may include: There may be no hereditary risk for cancer in the family. The cancers in Alyssa Kemp and/or her family may  be sporadic/familial or due to other genetic and environmental factors. There may be a gene mutation in one of these genes that current testing methods cannot detect but that chance is small. There could be another gene that has not yet been discovered, or that we have not yet tested, that is responsible for the cancer  diagnoses in the family.  It is also possible there is a hereditary cause for the cancer in the family that Ms. Marszalek did not inherit.  Therefore, it is important to remain in touch with cancer genetics in the future so that we can continue to offer Alyssa Kemp the most up to date genetic testing.   ADDITIONAL GENETIC TESTING:  We discussed with Alyssa Kemp that her genetic testing was fairly extensive.  If there are additional relevant genes identified to increase cancer risk that can be analyzed in the future, we would be happy to discuss and coordinate this testing at that time.    CANCER SCREENING RECOMMENDATIONS:  Alyssa Kemp test result is considered negative (normal).  This means that we have not identified a hereditary cause for her family history of cancer at this time.   An individual's cancer risk and medical management are not determined by genetic test results alone. Overall cancer risk assessment incorporates additional factors, including personal medical history, family history, and any available genetic information that may result in a personalized plan for cancer prevention and surveillance. Therefore, it is recommended she continue to follow the cancer management and screening guidelines provided by her primary healthcare provider.  RECOMMENDATIONS FOR FAMILY MEMBERS:   Since she did not inherit a identifiable mutation in a cancer predisposition gene included on this panel, her children could not have inherited a known mutation from her in one of these genes. Individuals in this family might be at some increased risk of developing cancer, over the general population risk, due to the family history of cancer.  Individuals in the family should notify their providers of the family history of cancer. We recommend women in this family have a yearly mammogram beginning at age 89, or 57 years younger than the earliest onset of cancer, an annual clinical breast exam, and perform monthly breast  self-exams.  Family members should have colonoscopies by at age 38, or earlier, as recommended by their providers. Other members of the family may still carry a pathogenic variant in one of these genes that Alyssa Kemp did not inherit. Based on the family history, we recommend her maternal aunt, who was diagnosed with pancreatic cancer in her 3s, have genetic counseling and testing. Alyssa Kemp will let us know if we can be of any assistance in coordinating genetic counseling and/or testing for this family member.  If her maternal aunt is not available, we would recommend testing for her mother. Her mother would also qualify for pancreatic cancer screening.   FOLLOW-UP:  Lastly, we discussed with Alyssa Kemp that cancer genetics is a rapidly advancing field and it is possible that new genetic tests will be appropriate for her and/or her family members in the future. We encouraged her to remain in contact with cancer genetics on an annual basis so we can update her personal and family histories and let her know of advances in cancer genetics that may benefit this family.   Our contact number was provided. Ms. Paolo questions were answered to her satisfaction, and she knows she is welcome to call us at anytime with additional questions or concerns.  Faith Rogue, MS, [facility] Genetic Counselor [email or web address] Phone: [phone]

## 2022-06-09 NOTE — Telephone Encounter (Signed)
I contacted [name] to discuss her genetic testing results. No pathogenic variants were identified in the 70 genes analyzed. Detailed clinic note to follow.   The test report has been scanned into EPIC and is located under the Molecular Pathology section of the Results Review tab.  A portion of the result report is included below for reference.      Alyssa Duverney, MS, [facility] Genetic Counselor [email or web address] Phone: [phone]

## 2022-06-22 ENCOUNTER — Emergency Department (HOSPITAL_COMMUNITY)
Admission: EM | Admit: 2022-06-22 | Discharge: 2022-06-22 | Disposition: A | Discharge status: Home / Self Care | Payer: Medicaid Other | Attending: Student | Admitting: Student

## 2022-06-22 ENCOUNTER — Emergency Department (HOSPITAL_COMMUNITY): Payer: Medicaid Other

## 2022-06-22 ENCOUNTER — Other Ambulatory Visit: Payer: Self-pay

## 2022-06-22 DIAGNOSIS — Z23 Encounter for immunization: Secondary | ICD-10-CM | POA: Diagnosis not present

## 2022-06-22 DIAGNOSIS — S6992XA Unspecified injury of left wrist, hand and finger(s), initial encounter: Secondary | ICD-10-CM | POA: Diagnosis not present

## 2022-06-22 DIAGNOSIS — S61411A Laceration without foreign body of right hand, initial encounter: Secondary | ICD-10-CM

## 2022-06-22 DIAGNOSIS — S61412A Laceration without foreign body of left hand, initial encounter: Secondary | ICD-10-CM | POA: Diagnosis present

## 2022-06-22 DIAGNOSIS — W268XXA Contact with other sharp object(s), not elsewhere classified, initial encounter: Secondary | ICD-10-CM | POA: Diagnosis not present

## 2022-06-22 DIAGNOSIS — Y9389 Activity, other specified: Secondary | ICD-10-CM | POA: Diagnosis not present

## 2022-06-22 DIAGNOSIS — R059 Cough, unspecified: Secondary | ICD-10-CM | POA: Diagnosis not present

## 2022-06-22 MED ORDER — LIDOCAINE HCL (PF) 1 % IJ SOLN
5.0000 mL | Freq: Once | INTRAMUSCULAR | Status: DC
  Filled 2022-06-22: qty 5

## 2022-06-22 MED ORDER — OXYCODONE HCL 5 MG PO TABS
5.0000 mg | ORAL_TABLET | Freq: Once | ORAL | Status: AC
  Administered 2022-06-22: 5 mg via ORAL
  Filled 2022-06-22: qty 1

## 2022-06-22 MED ORDER — ACETAMINOPHEN 500 MG PO TABS
1000.0000 mg | ORAL_TABLET | Freq: Once | ORAL | Status: AC
  Administered 2022-06-22: 1000 mg via ORAL
  Filled 2022-06-22: qty 2

## 2022-06-22 MED ORDER — TETANUS-DIPHTH-ACELL PERTUSSIS 5-2.5-18.5 LF-MCG/0.5 IM SUSY
0.5000 mL | PREFILLED_SYRINGE | Freq: Once | INTRAMUSCULAR | Status: AC
  Administered 2022-06-22: 0.5 mL via INTRAMUSCULAR
  Filled 2022-06-22: qty 0.5

## 2022-06-22 NOTE — Discharge Instructions (Signed)
Please read and follow all provided instructions.  Your diagnoses today include:  1. Laceration of right hand without foreign body, initial encounter     Tests performed today include: X-ray of the affected area that did not show any foreign bodies or broken bones Vital signs. See below for your results today.   Medications prescribed:  Please use over-the-counter NSAID medications (ibuprofen, naproxen) or Tylenol (acetaminophen) as directed on the packaging for pain -- as long as you do not have any reasons avoid these medications. Reasons to avoid NSAID medications include: weak kidneys, a history of bleeding in your stomach or gut, or uncontrolled high blood pressure or previous heart attack. Reasons to avoid Tylenol include: liver problems or ongoing alcohol use. Never take more than 4000mg  or 8 Extra strength Tylenol in a 24 hour period.     Take any prescribed medications only as directed.   Home care instructions:  Follow any educational materials and wound care instructions contained in this packet.   Keep affected area above the level of your heart when possible to minimize swelling. Wash area gently twice a day with warm soapy water. Do not apply alcohol or hydrogen peroxide. Cover the area if it draining or weeping.   Follow-up instructions: Suture Removal: Return to the Emergency Department or see your primary care care doctor in 10 days for a recheck of your wound and removal of your sutures or staples.    Return instructions:  Return to the Emergency Department if you have: Fever Worsening pain Worsening swelling of the wound Pus draining from the wound Redness of the skin that moves away from the wound, especially if it streaks away from the affected area  Any other emergent concerns  Your vital signs today were: BP (!) 136/93   Pulse 92   Temp 98.1 F (36.7 C) (Oral)   Resp 20   SpO2 100%  If your blood pressure (BP) was elevated above 135/85 this visit, please  have this repeated by your doctor within one month. --------------
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
## 2022-07-02 ENCOUNTER — Ambulatory Visit: Payer: Medicaid Other | Admitting: Physician Assistant

## 2022-07-02 VITALS — BP 118/77 | HR 76 | Temp 98.4°F | Ht 64.02 in | Wt 169.2 lb

## 2022-07-02 DIAGNOSIS — Z4802 Encounter for removal of sutures: Secondary | ICD-10-CM | POA: Diagnosis not present
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
## 2022-07-20 NOTE — Progress Notes (Unsigned)
,  There were no vitals taken for this visit.   Subjective:    [name], female    DOB: Nov 06, 1990, 32 y.o.   MRN: [id number]  HPI: Alyssa Kemp is a 32 y.o. female presenting on 07/21/2022 for comprehensive medical examination. Current medical complaints include:none  She currently lives with: Menopausal Symptoms: no  MOOD Patient states she feels like her anxiety has improved.  She sees a holistic therapist weekly that helps.  States her circumstances have improved also which has helped her anxiety.   Patient states she has been having more acid reflux.  She does have an appointment with GI doctor next week.  She would like a refill of her Omeprazole.  Patient states she got sick at the end of July and feels like her lungs are just not the same at night.  Has some SOB at night when laying down.  Denies HA, CP, dizziness, palpitations, visual changes, and lower extremity swelling.   Depression Screen done today and results listed below:     07/02/2022    8:42 AM 04/09/2022    3:37 PM 03/06/2021   10:19 AM 07/31/2019   10:13 AM 07/11/2018    2:04 PM  Depression screen PHQ 2/9  Decreased Interest 0 0 0 0 0  Down, Depressed, Hopeless 0 0 0 0 0  PHQ - 2 Score 0 0 0 0 0  Altered sleeping 0 0  0 0  Tired, decreased energy 0 0  0 0  Change in appetite 0 0  0 0  Feeling bad or failure about yourself  0 0  0 0  Trouble concentrating 0 0  0 3  Moving slowly or fidgety/restless 0 0  0 0  Suicidal thoughts 0 0  0   PHQ-9 Score 0 0  0 3  Difficult doing work/chores  Not difficult at all   Not difficult at all    The patient does not have a history of falls. I did complete a risk assessment for falls. A plan of care for falls was documented.   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   Scoliosis     Surgical History:  Past Surgical History:  Procedure Laterality Date   ADENOIDECTOMY     COLPOSCOPY  01/15/2016   CIN 1   CYSTOSCOPY W/ RETROGRADES Bilateral 12/28/2017   Procedure: CYSTOSCOPY WITH RETROGRADE PYELOGRAM;  Surgeon: Abbie Sons, MD;  Location: [facility];  Service: Urology;  Laterality: Bilateral;   TONSILLECTOMY     TUBAL LIGATION Bilateral 09/19/2016   Procedure: POST PARTUM TUBAL LIGATION;  Surgeon: Will Bonnet, MD;  Location: [facility];  Service: Gynecology;  Laterality: Bilateral;   WISDOM TOOTH EXTRACTION      Medications:  Current Outpatient Medications on File Prior to Visit  Medication Sig   cetirizine (ZYRTEC) 10 MG tablet TAKE 1 TABLET(10 MG) BY MOUTH DAILY   EPINEPHrine (EPIPEN 2-PAK) 0.3 mg/0.3 mL IJ SOAJ injection Inject 0.3 mLs (0.3 mg total) into the muscle as needed for anaphylaxis.   Multiple Vitamin (MULTIVITAMINS PO) Take by mouth daily.   omeprazole (PRILOSEC) 20 MG capsule TAKE 1 CAPSULE(20 MG) BY  MOUTH DAILY   No current facility-administered medications on file prior to visit.    Allergies:  Allergies  Allergen Reactions   Sulfacetamide Sodium Shortness Of Breath   Sulfasalazine Shortness Of Breath   Tussionex Pennkinetic Er [Hydrocod Poli-Chlorphe Poli Er] Hives and Swelling   Doxycycline Swelling   Vyvanse [Lisdexamfetamine] Rash   Ciprofloxacin Other (See Comments)    doesn't recall   Macrobid [Nitrofurantoin] Hives   Amoxicillin-Pot Clavulanate Other (See Comments)    Unknown- from childhood    Social History:  Social History   Socioeconomic History   Marital status: Single    Spouse name: Not on file   Number of children: Not on file   Years of education: Not on file   Highest education level: Not on file  Occupational History   Not on file  Tobacco Use    Smoking status: Never   Smokeless tobacco: Never  Vaping Use   Vaping Use: Never used  Substance and Sexual Activity   Alcohol use: No    Comment: socially   Drug use: No   Sexual activity: Yes    Birth control/protection: None, Surgical    Comment: BTL  Other Topics Concern   Not on file  Social History Narrative   Not on file   Social Determinants of Health   Financial Resource Strain: Not on file  Food Insecurity: Not on file  Transportation Needs: Not on file  Physical Activity: Not on file  Stress: Not on file  Social Connections: Not on file  Intimate Partner Violence: Not on file   Social History   Tobacco Use  Smoking Status Never  Smokeless Tobacco Never   Social History   Substance and Sexual Activity  Alcohol Use No   Comment: socially    Family History:  Family History  Problem Relation Age of Onset   Cervical cancer Mother    Pancreatic cancer Maternal Aunt        dx late 55s   Hodgkin's lymphoma Paternal Aunt        dx 30s   Hypertension Maternal Grandmother    Pancreatic cancer Maternal Grandmother 29   Diabetes Maternal Grandmother    Coronary artery disease Maternal Grandfather    Congestive Heart Failure Maternal Grandfather    Hypertension Maternal Grandfather    Prostate cancer Maternal Grandfather        dx 80s   Diabetes Maternal Grandfather    Hypertension Paternal Grandmother    Diabetes Paternal Grandmother    Heart disease Paternal Grandfather    Hypertension Paternal Grandfather    Stomach cancer Paternal Great-grandmother    Lung cancer Paternal Great-grandfather    Stomach cancer Maternal Great-grandfather     Past medical history, surgical history, medications, allergies, family history and social history reviewed with patient today and changes made to appropriate areas of the chart.   Review of Systems  Eyes:  Negative for blurred vision and double vision.  Respiratory:  Positive for shortness of breath.    Cardiovascular:  Negative for chest pain, palpitations and leg swelling.  Neurological:  Negative for dizziness and headaches.  Psychiatric/Behavioral:  Positive for suicidal ideas. Negative for depression. The patient is not nervous/anxious.    All other ROS negative except what is listed above and in the HPI.      Objective:    There were no vitals taken for this visit.  Wt Readings from Last 3 Encounters:  07/02/22 169 lb 3.2 oz (76.7 kg)  05/11/22  164 lb (74.4 kg)  05/04/22 164 lb (74.4 kg)    Physical Exam Vitals and nursing note reviewed. Exam conducted with a chaperone present Yvonna Alanis, CMA.).  Constitutional:      General: She is awake. She is not in acute distress.    Appearance: She is well-developed. She is not ill-appearing.  HENT:     Head: Normocephalic and atraumatic.     Right Ear: Hearing, tympanic membrane, ear canal and external ear normal. No drainage.     Left Ear: Hearing, tympanic membrane, ear canal and external ear normal. No drainage.     Nose: Nose normal.     Right Sinus: No maxillary sinus tenderness or frontal sinus tenderness.     Left Sinus: No maxillary sinus tenderness or frontal sinus tenderness.     Mouth/Throat:     Mouth: Mucous membranes are moist.     Pharynx: Oropharynx is clear. Uvula midline. No pharyngeal swelling, oropharyngeal exudate or posterior oropharyngeal erythema.  Eyes:     General: Lids are normal.        Right eye: No discharge.        Left eye: No discharge.     Extraocular Movements: Extraocular movements intact.     Conjunctiva/sclera: Conjunctivae normal.     Pupils: Pupils are equal, round, and reactive to light.     Visual Fields: Right eye visual fields normal and left eye visual fields normal.  Neck:     Thyroid: No thyromegaly.     Vascular: No carotid bruit.     Trachea: Trachea normal.  Cardiovascular:     Rate and Rhythm: Normal rate and regular rhythm.     Heart sounds: Normal heart sounds. No  murmur heard.    No gallop.  Pulmonary:     Effort: Pulmonary effort is normal. No accessory muscle usage or respiratory distress.     Breath sounds: Normal breath sounds.  Chest:  Breasts:    Right: Normal.     Left: Normal.  Abdominal:     General: Bowel sounds are normal.     Palpations: Abdomen is soft. There is no hepatomegaly or splenomegaly.     Tenderness: There is no abdominal tenderness.  Genitourinary:    General: Normal vulva.     Vagina: Normal.     Cervix: Normal.     Adnexa: Right adnexa normal and left adnexa normal.  Musculoskeletal:        General: Normal range of motion.     Cervical back: Normal range of motion and neck supple.     Right lower leg: No edema.     Left lower leg: No edema.  Lymphadenopathy:     Head:     Right side of head: No submental, submandibular, tonsillar, preauricular or posterior auricular adenopathy.     Left side of head: No submental, submandibular, tonsillar, preauricular or posterior auricular adenopathy.     Cervical: No cervical adenopathy.     Upper Body:     Right upper body: No supraclavicular, axillary or pectoral adenopathy.     Left upper body: No supraclavicular, axillary or pectoral adenopathy.  Skin:    General: Skin is warm and dry.     Capillary Refill: Capillary refill takes less than 2 seconds.     Findings: No rash.  Neurological:     Mental Status: She is alert and oriented to person, place, and time.     Gait: Gait is intact.     Deep  Tendon Reflexes: Reflexes are normal and symmetric.     Reflex Scores:      Brachioradialis reflexes are 2+ on the right side and 2+ on the left side.      Patellar reflexes are 2+ on the right side and 2+ on the left side. Psychiatric:        Attention and Perception: Attention normal.        Mood and Affect: Mood normal.        Speech: Speech normal.        Behavior: Behavior normal. Behavior is cooperative.        Thought Content: Thought content normal.         Judgment: Judgment normal.     Results for orders placed or performed during the hospital encounter of 05/04/22  Resp Panel by RT-PCR (Flu A&B, Covid) Anterior Nasal Swab   Specimen: Anterior Nasal Swab  Result Value Ref Range   SARS Coronavirus 2 by RT PCR NEGATIVE NEGATIVE   Influenza A by PCR NEGATIVE NEGATIVE   Influenza B by PCR NEGATIVE NEGATIVE      Assessment & Plan:   Problem List Items Addressed This Visit       Other   Anxiety, generalized   Bipolar 1 disorder (HCC) - Primary     Follow up plan: No follow-ups on file.   LABORATORY TESTING:  - Pap smear: pap done  IMMUNIZATIONS:   - Tdap: Tetanus vaccination status reviewed: last tetanus booster within 10 years. - Influenza: Refused - Pneumovax: Not applicable - Prevnar: Not applicable - HPV: Administered today - Zostavax vaccine: Not applicable  SCREENING: -Mammogram: Not applicable  - Colonoscopy: Not applicable  - Bone Density: Not applicable  -Hearing Test: Not applicable  -Spirometry: Not applicable   PATIENT COUNSELING:   Advised to take 1 mg of folate supplement per day if capable of pregnancy.   Sexuality: Discussed sexually transmitted diseases, partner selection, use of condoms, avoidance of unintended pregnancy  and contraceptive alternatives.   Advised to avoid cigarette smoking.  I discussed with the patient that most people either abstain from alcohol or drink within safe limits (<=14/week and <=4 drinks/occasion for males, <=7/weeks and <= 3 drinks/occasion for females) and that the risk for alcohol disorders and other health effects rises proportionally with the number of drinks per week and how often a drinker exceeds daily limits.  Discussed cessation/primary prevention of drug use and availability of treatment for abuse.   Diet: Encouraged to adjust caloric intake to maintain  or achieve ideal body weight, to reduce intake of dietary saturated fat and total fat, to limit sodium  intake by avoiding high sodium foods and not adding table salt, and to maintain adequate dietary potassium and calcium preferably from fresh fruits, vegetables, and low-fat dairy products.    stressed the importance of regular exercise  Injury prevention: Discussed safety belts, safety helmets, smoke detector, smoking near bedding or upholstery.   Dental health: Discussed importance of regular tooth brushing, flossing, and dental visits.    NEXT PREVENTATIVE PHYSICAL DUE IN 1 YEAR. No follow-ups on file.

## 2022-07-21 ENCOUNTER — Ambulatory Visit (INDEPENDENT_AMBULATORY_CARE_PROVIDER_SITE_OTHER): Payer: Medicaid Other | Admitting: Nurse Practitioner

## 2022-07-21 ENCOUNTER — Encounter: Payer: Self-pay | Admitting: Nurse Practitioner

## 2022-07-21 VITALS — BP 95/58 | HR 72 | Temp 98.7°F | Ht 64.6 in | Wt 164.3 lb

## 2022-07-21 DIAGNOSIS — F411 Generalized anxiety disorder: Secondary | ICD-10-CM | POA: Diagnosis not present

## 2022-07-21 DIAGNOSIS — Z136 Encounter for screening for cardiovascular disorders: Secondary | ICD-10-CM | POA: Diagnosis not present

## 2022-07-21 DIAGNOSIS — F319 Bipolar disorder, unspecified: Secondary | ICD-10-CM

## 2022-07-21 DIAGNOSIS — R8290 Unspecified abnormal findings in urine: Secondary | ICD-10-CM

## 2022-07-21 DIAGNOSIS — Z0000 Encounter for general adult medical examination without abnormal findings: Secondary | ICD-10-CM

## 2022-07-21 LAB — URINALYSIS, ROUTINE W REFLEX MICROSCOPIC
Bilirubin, UA: NEGATIVE
Glucose, UA: NEGATIVE
Ketones, UA: NEGATIVE
Nitrite, UA: NEGATIVE
Protein,UA: NEGATIVE
Specific Gravity, UA: <1.005 — ABNORMAL LOW (ref 1.005–1.030)
Urobilinogen, Ur: 0.2 mg/dL (ref 0.2–1.0)
pH, UA: 5.5 (ref 5.0–7.5)

## 2022-07-21 LAB — MICROSCOPIC EXAMINATION

## 2022-07-21 NOTE — Addendum Note (Signed)
Addended by: Jon Billings on: 07/21/2022 10:29 AM   Modules accepted: Orders

## 2022-07-21 NOTE — Assessment & Plan Note (Signed)
Referral placed for patient to see new psychiatrist.   

## 2022-07-21 NOTE — Assessment & Plan Note (Signed)
Referral placed for patient to see new psychiatrist.

## 2022-07-22 LAB — CBC WITH DIFFERENTIAL/PLATELET
Basophils Absolute: 0.1 10*3/uL (ref 0.0–0.2)
Basos: 1 %
EOS (ABSOLUTE): 0.1 10*3/uL (ref 0.0–0.4)
Eos: 2 %
Hematocrit: 37.7 % (ref 34.0–46.6)
Hemoglobin: 12.7 g/dL (ref 11.1–15.9)
Immature Grans (Abs): 0 10*3/uL (ref 0.0–0.1)
Immature Granulocytes: 0 %
Lymphocytes Absolute: 1.8 10*3/uL (ref 0.7–3.1)
Lymphs: 23 %
MCH: 30.3 pg (ref 26.6–33.0)
MCHC: 33.7 g/dL (ref 31.5–35.7)
MCV: 90 fL (ref 79–97)
Monocytes Absolute: 0.5 10*3/uL (ref 0.1–0.9)
Monocytes: 7 %
Neutrophils Absolute: 5 10*3/uL (ref 1.4–7.0)
Neutrophils: 67 %
Platelets: 239 10*3/uL (ref 150–450)
RBC: 4.19 x10E6/uL (ref 3.77–5.28)
RDW: 13.2 % (ref 11.7–15.4)
WBC: 7.5 10*3/uL (ref 3.4–10.8)

## 2022-07-22 LAB — COMPREHENSIVE METABOLIC PANEL
ALT: 12 IU/L (ref 0–32)
AST: 11 IU/L (ref 0–40)
Albumin/Globulin Ratio: 2.5 — ABNORMAL HIGH (ref 1.2–2.2)
Albumin: 4.5 g/dL (ref 4.0–5.0)
Alkaline Phosphatase: 40 IU/L — ABNORMAL LOW (ref 44–121)
BUN/Creatinine Ratio: 18 (ref 9–23)
BUN: 13 mg/dL (ref 6–20)
Bilirubin Total: 0.5 mg/dL (ref 0.0–1.2)
CO2: 20 mmol/L (ref 20–29)
Calcium: 9.2 mg/dL (ref 8.7–10.2)
Chloride: 107 mmol/L — ABNORMAL HIGH (ref 96–106)
Creatinine, Ser: 0.71 mg/dL (ref 0.57–1.00)
Globulin, Total: 1.8 g/dL (ref 1.5–4.5)
Glucose: 87 mg/dL (ref 70–99)
Potassium: 4.4 mmol/L (ref 3.5–5.2)
Sodium: 141 mmol/L (ref 134–144)
Total Protein: 6.3 g/dL (ref 6.0–8.5)
eGFR: 117 mL/min/{1.73_m2} (ref >59)

## 2022-07-22 LAB — TSH: TSH: 0.519 u[IU]/mL (ref 0.450–4.500)

## 2022-07-22 LAB — LIPID PANEL
Chol/HDL Ratio: 3.2 ratio (ref 0.0–4.4)
Cholesterol, Total: 152 mg/dL (ref 100–199)
HDL: 47 mg/dL (ref >39)
LDL Chol Calc (NIH): 93 mg/dL (ref 0–99)
Triglycerides: 56 mg/dL (ref 0–149)
VLDL Cholesterol Cal: 12 mg/dL (ref 5–40)

## 2022-07-22 NOTE — Progress Notes (Signed)
Hi [name]. It was nice to see you yesterday.  Your lab work looks good.  No concerns at this time. Continue with your current medication regimen.  Follow up as discussed.  Please let me know if you have any questions.

## 2022-07-23 LAB — URINE CULTURE

## 2022-07-23 NOTE — Progress Notes (Signed)
Hi Alyssa Kemp. There was no growth on your urine culture.  No need for antibiotic treatment at this time.

## 2022-08-18 DIAGNOSIS — J329 Chronic sinusitis, unspecified: Secondary | ICD-10-CM | POA: Diagnosis not present

## 2022-08-18 DIAGNOSIS — J029 Acute pharyngitis, unspecified: Secondary | ICD-10-CM | POA: Diagnosis not present

## 2022-08-18 DIAGNOSIS — F418 Other specified anxiety disorders: Secondary | ICD-10-CM | POA: Diagnosis not present

## 2022-08-18 DIAGNOSIS — Z719 Counseling, unspecified: Secondary | ICD-10-CM | POA: Diagnosis not present

## 2022-08-31 ENCOUNTER — Other Ambulatory Visit: Payer: Self-pay | Admitting: Nurse Practitioner

## 2022-09-01 NOTE — Telephone Encounter (Signed)
Requested Prescriptions  Pending Prescriptions Disp Refills   omeprazole (PRILOSEC) 20 MG capsule [Pharmacy Med Name: OMEPRAZOLE '20MG'$  CAPSULES] 90 capsule 3    Sig: TAKE 1 CAPSULE(20 MG) BY MOUTH DAILY     Gastroenterology: Proton Pump Inhibitors Passed - 08/31/2022  3:06 AM      Passed - Valid encounter within last 12 months    Recent Outpatient Visits           1 month ago Annual physical exam   [place], NP   2 months ago Visit for suture removal   [place], PA-C   3 months ago Acute cough   [place] [facility] [place], Dani Gobble, PA-C   4 months ago Vaginal discharge   [place], Dani Gobble, PA-C   5 months ago Tooth infection   [place], [place], [place]

## 2022-09-11 ENCOUNTER — Ambulatory Visit: Payer: Self-pay | Admitting: *Deleted

## 2022-09-11 ENCOUNTER — Inpatient Hospital Stay (HOSPITAL_COMMUNITY)
Admission: EM | Admit: 2022-09-11 | Discharge: 2022-09-11 | Disposition: A | Discharge status: Home / Self Care | Payer: Medicaid Other | Attending: Obstetrics & Gynecology | Admitting: Obstetrics & Gynecology

## 2022-09-11 ENCOUNTER — Encounter (HOSPITAL_COMMUNITY): Payer: Self-pay

## 2022-09-11 DIAGNOSIS — O209 Hemorrhage in early pregnancy, unspecified: Secondary | ICD-10-CM | POA: Diagnosis not present

## 2022-09-11 DIAGNOSIS — Z3A01 Less than 8 weeks gestation of pregnancy: Secondary | ICD-10-CM | POA: Diagnosis not present

## 2022-09-11 DIAGNOSIS — N939 Abnormal uterine and vaginal bleeding, unspecified: Secondary | ICD-10-CM | POA: Diagnosis not present

## 2022-09-11 DIAGNOSIS — Z3202 Encounter for pregnancy test, result negative: Secondary | ICD-10-CM | POA: Diagnosis not present

## 2022-09-11 LAB — CBC
HCT: 35.2 % — ABNORMAL LOW (ref 36.0–46.0)
Hemoglobin: 12.6 g/dL (ref 12.0–15.0)
MCH: 30.1 pg (ref 26.0–34.0)
MCHC: 35.8 g/dL (ref 30.0–36.0)
MCV: 84.2 fL (ref 80.0–100.0)
Platelets: 247 10*3/uL (ref 150–400)
RBC: 4.18 MIL/uL (ref 3.87–5.11)
RDW: 12.6 % (ref 11.5–15.5)
WBC: 8.9 10*3/uL (ref 4.0–10.5)
nRBC: 0 % (ref 0.0–0.2)

## 2022-09-11 LAB — COMPREHENSIVE METABOLIC PANEL
ALT: 14 U/L (ref 0–44)
AST: 19 U/L (ref 15–41)
Albumin: 4.2 g/dL (ref 3.5–5.0)
Alkaline Phosphatase: 40 U/L (ref 38–126)
Anion gap: 8 (ref 5–15)
BUN: 12 mg/dL (ref 6–20)
CO2: 21 mmol/L — ABNORMAL LOW (ref 22–32)
Calcium: 9.2 mg/dL (ref 8.9–10.3)
Chloride: 105 mmol/L (ref 98–111)
Creatinine, Ser: 0.82 mg/dL (ref 0.44–1.00)
GFR, Estimated: >60 mL/min (ref >60)
Glucose, Bld: 102 mg/dL — ABNORMAL HIGH (ref 70–99)
Potassium: 4.1 mmol/L (ref 3.5–5.1)
Sodium: 134 mmol/L — ABNORMAL LOW (ref 135–145)
Total Bilirubin: 0.6 mg/dL (ref 0.3–1.2)
Total Protein: 6.6 g/dL (ref 6.5–8.1)

## 2022-09-11 LAB — I-STAT BETA HCG BLOOD, ED (NOT ORDERABLE): I-stat hCG, quantitative: <5 m[IU]/mL (ref <5)

## 2022-09-11 LAB — POCT PREGNANCY, URINE: Preg Test, Ur: NEGATIVE

## 2022-09-11 MED ORDER — MEGESTROL ACETATE 40 MG PO TABS: 40.0000 mg | ORAL_TABLET | Freq: Two times a day (BID) | ORAL | 0 refills | Status: DC

## 2022-09-11 MED ORDER — IBUPROFEN 600 MG PO TABS: 600.0000 mg | ORAL_TABLET | Freq: Four times a day (QID) | ORAL | 0 refills | Status: AC

## 2022-09-11 NOTE — MAU Note (Signed)
...  Alyssa Kemp is a 32 y.o. at Unknown here in MAU reporting: Vaginal bleeding since this past Wednesday. She reports the bleeding is very heavy, "tissue-y," and "blood clot-y." She reports today she is now experiencing lower back pain and lower abdominal pain. She reports she is passing blood clots that are dime sized and smaller. She reports the blood clots are string like as well.  She reports she had her tubes tied six years ago.    LMP: 07/23/2022 - regular periods Onset of complaint: 09/09/2022  Pain score:  7/10 lower back 7/10 lower abdomen  Lab orders placed from triage:  POCT Preg

## 2022-09-11 NOTE — MAU Note (Signed)
RN called lab to inquire about CBC. Lab tech answered and informed RN that that the sample is in process now. Lab tech said that sample should be done in two minutes.

## 2022-09-11 NOTE — Telephone Encounter (Signed)
Chief Complaint: vaginal bleeding  Symptoms: hx tubal ligation 2018 has not had period in 46 days and started bleeding yesterday. Abdominal pain , taking ibuprofen 600 mg . Vaginal bleeding with "stringy clots"  dime size, fresh blood. Reports passing different looking tissue red in color not gray.  Frequency: yesterday  Pertinent Negatives: Patient denies difficulty breathing no dizziness no lightheadedness Disposition: [x] ED /[] Urgent Care (no appt availability in office) / [] Appointment(In office/virtual)/ []  [place] [facility]/ [] Home Care/ [] Refused Recommended Disposition /[] [place] [facility]/ []  Follow-up with PCP Additional Notes:   Recommended ED for possible ectopic pregnancy. Patient would like PCP to be notified and call back if she can be seen in office instead. Please advise. Team message sent to [place].     Reason for Disposition  Patient sounds very sick or weak to the triager  Answer Assessment - Initial Assessment Questions 1. AMOUNT: "Describe the bleeding that you are having."    - SPOTTING: spotting, or pinkish / brownish mucous discharge; does not fill panty liner or pad    - MILD:  less than 1 pad / hour; less than patient's usual menstrual bleeding   - MODERATE: 1-2 pads / hour; 1 menstrual cup every 6 hours; small-medium blood clots (e.g., pea, grape, small coin)   - SEVERE: soaking 2 or more pads/hour for 2 or more hours; 1 menstrual cup every 2 hours; bleeding not contained by pads or continuous red blood from vagina; large blood clots (e.g., golf ball, large coin)      Vaginal bleeding "fresh blood" stringy clots dime size abdominal pain 2. ONSET: "When did the bleeding begin?" "Is it continuing now?"     Yesterday after no period in 46 days 3. MENSTRUAL PERIOD: "When was the last normal menstrual period?" "How is this different than your period?"     46 days ago  4. REGULARITY: "How regular are your periods?"     Has not had period in 46 days  5.  ABDOMEN PAIN: "Do you have any pain?" "How bad is the pain?"  (e.g., Scale 1-10; mild, moderate, or severe)   - MILD (1-3): doesn't interfere with normal activities, abdomen soft and not tender to touch    - MODERATE (4-7): interferes with normal activities or awakens from sleep, abdomen tender to touch    - SEVERE (8-10): excruciating pain, doubled over, unable to do any normal activities      Abdominal pain taking ibuprofen 600 mg for pain  6. PREGNANCY: "Is there any chance you are pregnant?" "When was your last menstrual period?"     Possible hx tubal ligation 2018 7. BREASTFEEDING: "Are you breastfeeding?"     na 8. HORMONE MEDICINES: "Are you taking any hormone medicines, prescription or over-the-counter?" (e.g., birth control pills, estrogen)     na 9. BLOOD THINNER MEDICINES: "Do you take any blood thinners?" (e.g., Coumadin / warfarin, Pradaxa / dabigatran, aspirin)     na 10. CAUSE: "What do you think is causing the bleeding?" (e.g., recent gyn surgery, recent gyn procedure; known bleeding disorder, cervical cancer, polycystic ovarian disease, fibroids)         na 11. HEMODYNAMIC STATUS: "Are you weak or feeling lightheaded?" If Yes, ask: "Can you stand and walk normally?"        na 12. OTHER SYMPTOMS: "What other symptoms are you having with the bleeding?" (e.g., passed tissue, vaginal discharge, fever, menstrual-type cramps)       Passed tissue that looked different. Red not  gray in color  Protocols used: Vaginal Bleeding - Abnormal-A-AH

## 2022-09-11 NOTE — MAU Provider Note (Signed)
History     [id number]  Arrival date and time: 09/11/22 1449    Chief Complaint  Patient presents with   Abdominal Pain   Back Pain   Vaginal Bleeding     HPI Alyssa Kemp is a 32 y.o. [phone] non pregnant female who presents for vaginal bleeding. Patient states normal cycle is 21 days but had not had bleeding episode in over 40 days before her current bleeding episode started on Wednesday. Not saturating pads but has been passing stringy & squishy clots. Has been having lower abdominal & back pain. Has taken ibuprofen without relief.  Had tubal ligation in 2018 & is planning on having it reversed in the Fall with Dr. Kerin Perna. Has not had pregnancy test at home.   OB History     Gravida  3   Para  3   Term  3   Preterm      AB      Living  3      SAB      IAB      Ectopic      Multiple      Live Births  3           Past Medical History:  Diagnosis Date   Bipolar depression ([place])    GERD (gastroesophageal reflux disease)    Gestational diabetes    LGSIL on Pap smear of cervix 12/16/2015   Scoliosis     Past Surgical History:  Procedure Laterality Date   ADENOIDECTOMY     COLPOSCOPY  01/15/2016   CIN 1   CYSTOSCOPY W/ RETROGRADES Bilateral 12/28/2017   Procedure: CYSTOSCOPY WITH RETROGRADE PYELOGRAM;  Surgeon: Abbie Sons, MD;  Location: [facility] OR[facility];  Service: Urology;  Laterality: Bilateral;   TONSILLECTOMY     TUBAL LIGATION Bilateral 09/19/2016   Procedure: POST PARTUM TUBAL LIGATION;  Surgeon: Will Bonnet, MD;  Location: [facility] OR[facility];  Service: Gynecology;  Laterality: Bilateral;   WISDOM TOOTH EXTRACTION      Family History  Problem Relation Age of Onset   Cervical cancer Mother    Heart disease Father    Pancreatic cancer Maternal Aunt        dx late 93s   Hodgkin's lymphoma Paternal Aunt        dx 67s   Hypertension Maternal Grandmother    Pancreatic cancer Maternal Grandmother 70   Diabetes Maternal Grandmother     Coronary artery disease Maternal Grandfather    Congestive Heart Failure Maternal Grandfather    Hypertension Maternal Grandfather    Prostate cancer Maternal Grandfather        dx 30s   Diabetes Maternal Grandfather    Hypertension Paternal Grandmother    Diabetes Paternal Grandmother    Heart disease Paternal Grandfather    Hypertension Paternal Grandfather    Lung cancer Paternal Great-grandfather    Stomach cancer Paternal Great-grandmother    Stomach cancer Maternal Great-grandfather     Social History   Socioeconomic History   Marital status: Single    Spouse name: Not on file   Number of children: Not on file   Years of education: Not on file   Highest education level: Not on file  Occupational History   Not on file  Tobacco Use   Smoking status: Never   Smokeless tobacco: Never  Vaping Use   Vaping Use: Never used  Substance and Sexual Activity   Alcohol use: No    Comment: socially   Drug  use: No   Sexual activity: Yes    Birth control/protection: None, Surgical    Comment: BTL  Other Topics Concern   Not on file  Social History Narrative   Not on file   Social Determinants of Health   Financial Resource Strain: Not on file  Food Insecurity: Not on file  Transportation Needs: Not on file  Physical Activity: Not on file  Stress: Not on file  Social Connections: Not on file  Intimate Partner Violence: Not on file    Allergies  Allergen Reactions   Sulfacetamide Sodium Shortness Of Breath   Sulfasalazine Shortness Of Breath   Tussionex Pennkinetic Er [Hydrocod Poli-Chlorphe Poli Er] Hives and Swelling   Doxycycline Swelling   Vyvanse [Lisdexamfetamine] Rash   Ciprofloxacin Other (See Comments)    doesn't recall   Macrobid [Nitrofurantoin] Hives   Amoxicillin-Pot Clavulanate Other (See Comments)    Unknown- from childhood    No current facility-administered medications on file prior to encounter.   Current Outpatient Medications on File Prior  to Encounter  Medication Sig Dispense Refill   hydrOXYzine (ATARAX) 10 MG tablet Take 10 mg by mouth 3 (three) times daily as needed.     ibuprofen (ADVIL) 600 MG tablet Take 600 mg by mouth every 6 (six) hours as needed.     omeprazole (PRILOSEC) 20 MG capsule TAKE 1 CAPSULE(20 MG) BY MOUTH DAILY 90 capsule 3   cetirizine (ZYRTEC) 10 MG tablet TAKE 1 TABLET(10 MG) BY MOUTH DAILY 90 tablet 2   EPINEPHrine (EPIPEN 2-PAK) 0.3 mg/0.3 mL IJ SOAJ injection Inject 0.3 mLs (0.3 mg total) into the muscle as needed for anaphylaxis. 2 each 1   Multiple Vitamin (MULTIVITAMINS PO) Take by mouth daily.       ROS Pertinent positives and negative per HPI, all others reviewed and negative  Physical Exam   BP 120/70   Pulse 83   Temp 98.9 F (37.2 C) (Oral)   Resp 17   Ht 5\' 4"  (1.626 m)   Wt 74.5 kg   SpO2 100%   BMI 28.18 kg/m   Patient Vitals for the past 24 hrs:  BP Temp Temp src Pulse Resp SpO2 Height Weight  09/11/22 1556 120/70 -- -- 83 -- 100 % -- --  09/11/22 1536 121/64 98.9 F (37.2 C) Oral 72 17 99 % 5\' 4"  (1.626 m) 74.5 kg  09/11/22 1459 117/82 98.6 F (37 C) -- 88 16 99 % -- --    Physical Exam Vitals and nursing note reviewed. Exam conducted with a chaperone present.  Constitutional:      General: She is not in acute distress.    Appearance: She is well-developed. She is not ill-appearing.  HENT:     Head: Normocephalic and atraumatic.  Eyes:     General: No scleral icterus.       Right eye: No discharge.        Left eye: No discharge.     Conjunctiva/sclera: Conjunctivae normal.  Pulmonary:     Effort: Pulmonary effort is normal. No respiratory distress.  Abdominal:     General: Abdomen is flat.     Palpations: Abdomen is soft.  Genitourinary:    Cervix: No cervical motion tenderness.     Uterus: Normal.      Adnexa: Right adnexa normal and left adnexa normal.     Comments: Pelvic: NEFG, scant amount of dark red blood. Cervix pink/smooth.   Neurological:      General: No focal deficit  present.     Mental Status: She is alert.  Psychiatric:        Mood and Affect: Mood normal.        Behavior: Behavior normal.       Labs Results for orders placed or performed during the hospital encounter of 09/11/22 (from the past 24 hour(s))  CBC     Status: Abnormal   Collection Time: 09/11/22  3:05 PM  Result Value Ref Range   WBC 8.9 4.0 - 10.5 K/uL   RBC 4.18 3.87 - 5.11 MIL/uL   Hemoglobin 12.6 12.0 - 15.0 g/dL   HCT 35.2 (L) 36.0 - 46.0 %   MCV 84.2 80.0 - 100.0 fL   MCH 30.1 26.0 - 34.0 pg   MCHC 35.8 30.0 - 36.0 g/dL   RDW 12.6 11.5 - 15.5 %   Platelets 247 150 - 400 K/uL   nRBC 0.0 0.0 - 0.2 %  Comprehensive metabolic panel     Status: Abnormal   Collection Time: 09/11/22  3:05 PM  Result Value Ref Range   Sodium 134 (L) 135 - 145 mmol/L   Potassium 4.1 3.5 - 5.1 mmol/L   Chloride 105 98 - 111 mmol/L   CO2 21 (L) 22 - 32 mmol/L   Glucose, Bld 102 (H) 70 - 99 mg/dL   BUN 12 6 - 20 mg/dL   Creatinine, Ser 0.82 0.44 - 1.00 mg/dL   Calcium 9.2 8.9 - 10.3 mg/dL   Total Protein 6.6 6.5 - 8.1 g/dL   Albumin 4.2 3.5 - 5.0 g/dL   AST 19 15 - 41 U/L   ALT 14 0 - 44 U/L   Alkaline Phosphatase 40 38 - 126 U/L   Total Bilirubin 0.6 0.3 - 1.2 mg/dL   GFR, Estimated >60 >60 mL/min   Anion gap 8 5 - 15  I-Stat beta hCG blood, ED     Status: None   Collection Time: 09/11/22  3:36 PM  Result Value Ref Range   I-stat hCG, quantitative <5.0 <5 mIU/mL   Comment 3          Pregnancy, urine POC     Status: None   Collection Time: 09/11/22  3:44 PM  Result Value Ref Range   Preg Test, Ur NEGATIVE NEGATIVE    Imaging No results found.  MAU Course  Procedures Lab Orders         CBC         Comprehensive metabolic panel         I-Stat Beta hCG blood, ED ([facility], [facility], [facility] only)         I-Stat beta hCG blood, ED         Pregnancy, urine POC    Meds ordered this encounter  Medications   ibuprofen (ADVIL) 600 MG tablet    Sig: Take 1 tablet (600  mg total) by mouth every 6 (six) hours for 5 days.    Dispense:  20 tablet    Refill:  0    Order Specific Question:   Supervising Provider    Answer:   Verita Schneiders A [[id number]]   megestrol (MEGACE) 40 MG tablet    Sig: Take 1 tablet (40 mg total) by mouth 2 (two) times daily.    Dispense:  30 tablet    Refill:  0    Order Specific Question:   Supervising Provider    Answer:   Verita Schneiders A [id number]   Imaging Orders  No imaging  studies ordered today    MDM moderate  Assessment and Plan   1. Abnormal uterine bleeding (AUB)   2. Negative pregnancy test    -Patient was instructed to come to hospital to evaluate for pregnancy. UPT & HCG negative. Hemoglobin & vital signs stable. Small amount of bleeding on exam.  -Rx megace & ibuprofen. Patient should follow up with gyn if symptoms continue    Dispo: discharged to home in stable condition.   Discharge Instructions     Discharge patient   Complete by: As directed    Discharge disposition: 01-Home or Self Care   Discharge patient date: 09/11/2022       Jorje Guild, NP 09/11/22 5:26 PM  Allergies as of 09/11/2022       Reactions   Sulfacetamide Sodium Shortness Of Breath   Sulfasalazine Shortness Of Breath   Tussionex Pennkinetic Er [hydrocod Poli-chlorphe Poli Er] Hives, Swelling   Doxycycline Swelling   Vyvanse [lisdexamfetamine] Rash   Ciprofloxacin Other (See Comments)   doesn't recall   Macrobid [nitrofurantoin] Hives   Amoxicillin-pot Clavulanate Other (See Comments)   Unknown- from childhood        Medication List     TAKE these medications    cetirizine 10 MG tablet Commonly known as: ZYRTEC TAKE 1 TABLET(10 MG) BY MOUTH DAILY   EPINEPHrine 0.3 mg/0.3 mL Soaj injection Commonly known as: EpiPen 2-Pak Inject 0.3 mLs (0.3 mg total) into the muscle as needed for anaphylaxis.   hydrOXYzine 10 MG tablet Commonly known as: ATARAX Take 10 mg by mouth 3 (three) times daily as needed.    ibuprofen 600 MG tablet Commonly known as: ADVIL Take 1 tablet (600 mg total) by mouth every 6 (six) hours for 5 days. What changed:  when to take this reasons to take this   megestrol 40 MG tablet Commonly known as: MEGACE Take 1 tablet (40 mg total) by mouth 2 (two) times daily.   MULTIVITAMINS PO Take by mouth daily.   omeprazole 20 MG capsule Commonly known as: PRILOSEC TAKE 1 CAPSULE(20 MG) BY MOUTH DAILY

## 2022-09-11 NOTE — Discharge Instructions (Signed)
  [facility] Providers          [facility]  [address], [place], [place] [address]  [phone]  [facility]  [address], [place], [place] [address]  [phone]  [facility]  [address], , [place] [address]  [phone]  [facility] [address], [place], [place] [address] [phone]  [facility] [place]  [address], [place], [place] [address]  [phone]  [facility]  [address] floor), [place], [place] [address]  [phone]  [facility]  [address], [place], [place] [address]  [phone]  [facility]  [address], [place], [place] [address]  [phone]  [facility]  [address], [place], [place] [address]  [phone]  [facility]  [address], [place], [place] [address]  [phone]  [facility]  [address], [place], [place] [address]  [phone]  [facility]  [address], [place], [place] [address]  [phone]  [facility]   [address], [place], [place] [address]  [phone]  [facility]  [address], [place], [place] [address]   [phone]  [facility] [address], [place], [place] [address] [phone]  [facility]  [address], [place],  [address]  [phone]         

## 2022-09-11 NOTE — ED Provider Triage Note (Signed)
Emergency Medicine Provider OB Triage Evaluation Note  Alyssa Kemp is a 32 y.o. female, [id number],.  Patient is a 32 year old female who is presenting today with 48 hours with vaginal bleeding.  She says that she is passing very big clots and some thicker parts of her menses "feel like sponges."  She says that she pushes these areas and blood squirts out.  Also says that she is having some pelvic discomfort and cramping.  History of tubal ligation 6 years ago.  Usually has regular menses every 21 days however currently it has been 43 to 44 days.  Spoke with Erin at the MAU who accepts the patient for transfer  Review of  Systems  Positive:  Negative:   Physical Exam  BP 117/82 (BP Location: Right Arm)   Pulse 88   Temp 98.6 F (37 C)   Resp 16   SpO2 99%  General: Awake, no distress  HEENT: Atraumatic  Resp: Normal effort  Cardiac: Normal rate Abd: Nondistended, nontender  MSK: Moves all extremities without difficulty Neuro: Speech clear  Medical Decision Making  Pt evaluated for pregnancy concern and is stable for transfer to MAU. Pt is in agreement with plan for transfer.  3:11 PM Discussed with MAU APP, Junie Panning, who accepts patient in transfer.  Clinical Impression  No diagnosis found.     Rhae Hammock, PA-C 09/11/22 1512

## 2022-09-11 NOTE — ED Triage Notes (Addendum)
Pt has hx tubal ligation 2 years ago; last period Feb 4th; started bleeding past Wednesday, heavy, passing clots and tissue; endorses more cramping that normal, pain radiating to back; lowed abd pain worse with palpation; endorses mild nausea, less appetite prior to bleeding; taking ibuprofen at home

## 2022-09-14 NOTE — Telephone Encounter (Signed)
Patient should be seen in the ER.

## 2022-09-14 NOTE — Telephone Encounter (Signed)
Called and spoke to the patient. She states that she went to the ER on Friday and has an appointment with OBGYN on 09/28/22.

## 2022-09-28 ENCOUNTER — Ambulatory Visit: Payer: Medicaid Other | Admitting: Obstetrics & Gynecology

## 2022-09-28 ENCOUNTER — Encounter: Payer: Self-pay | Admitting: Obstetrics & Gynecology

## 2022-09-28 VITALS — BP 119/70 | HR 76 | Resp 16 | Ht 64.0 in | Wt 166.0 lb

## 2022-09-28 DIAGNOSIS — N926 Irregular menstruation, unspecified: Secondary | ICD-10-CM | POA: Diagnosis not present

## 2022-09-28 DIAGNOSIS — Z9851 Tubal ligation status: Secondary | ICD-10-CM

## 2022-09-28 NOTE — Progress Notes (Signed)
Pt is going through a Dr in [place] for TR

## 2022-09-28 NOTE — Progress Notes (Signed)
   GYN VISIT Patient name: Alyssa Kemp MRN [id number]  Date of birth: June 15, 1991 Chief Complaint:   AUB  History of Present Illness:   Alyssa Kemp is a 32 y.o. [phone]  female being seen today for MAU follow-up  Typically menses q 21-22days; however, missed a period- went ~ 45 days without a period then had a moderate to heavy period with passage of clots.  Seen by MAU- given Megace, which she did not take.  Also noted some pelvic pain.  Prior to this she was not having any problems with her period.  Bleeding has since stopped she denies any bleeding since that time.  Denies pelvic or abdominal pain.  She reports feeling as though since tubal ligation everything is just "off"  Patient is scheduled for tubal reversal this summer  HCG- negative 2018- BTL  Patient's last menstrual period was 09/09/2022.     07/21/2022    8:27 AM 07/02/2022    8:42 AM 04/09/2022    3:37 PM 03/06/2021   10:19 AM 07/31/2019   10:13 AM  Depression screen PHQ 2/9  Decreased Interest 0 0 0 0 0  Down, Depressed, Hopeless 0 0 0 0 0  PHQ - 2 Score 0 0 0 0 0  Altered sleeping 0 0 0  0  Tired, decreased energy 0 0 0  0  Change in appetite 0 0 0  0  Feeling bad or failure about yourself  0 0 0  0  Trouble concentrating 1 0 0  0  Moving slowly or fidgety/restless 0 0 0  0  Suicidal thoughts 0 0 0  0  PHQ-9 Score 1 0 0  0  Difficult doing work/chores Not difficult at all  Not difficult at all       Review of Systems:   Pertinent items are noted in HPI Denies fever/chills, dizziness, headaches, visual disturbances, fatigue, shortness of breath, chest pain, abdominal pain, vomiting, no problems with bowel movements, urination, or intercourse unless otherwise stated above.  Pertinent History Reviewed:  Reviewed past medical,surgical, social, obstetrical and family history.  Reviewed problem list, medications and allergies. Physical Assessment:   Vitals:   09/28/22 1030  BP: 119/70  Pulse: 76   Resp: 16  Weight: 166 lb (75.3 kg)  Height: 5\' 4"  (1.626 m)  Body mass index is 28.49 kg/m.       Physical Examination:   General appearance: alert, well appearing, and in no distress  Psych: mood appropriate, normal affect  Skin: warm & dry   Cardiovascular: normal heart rate noted  Respiratory: normal respiratory effort, no distress  Abdomen: soft, non-tender, no rebound no guarding  Pelvic: examination not indicated  Extremities: no edema   Chaperone: N/A    Assessment & Plan:  1) Irregular menses -Reassured patient that since this only happened for 1 menses no additional workup was indicated -Advised patient to monitor menses  2) h/o BTL -Briefly discussed post tubal ligation syndrome -Discussed potential management options -Once reversal surgery completed we will reevaluate symptoms  Return for Sept annual.   Myna Hidalgo, DO Attending Obstetrician & Gynecologist, [facility] for [facility], [facility]

## 2022-10-04 ENCOUNTER — Other Ambulatory Visit: Payer: Self-pay

## 2022-10-04 ENCOUNTER — Emergency Department (HOSPITAL_COMMUNITY)
Admission: EM | Admit: 2022-10-04 | Discharge: 2022-10-05 | Discharge status: Against Medical Advice | Payer: Medicaid Other | Attending: Emergency Medicine | Admitting: Emergency Medicine

## 2022-10-04 ENCOUNTER — Emergency Department (HOSPITAL_COMMUNITY): Payer: Medicaid Other

## 2022-10-04 DIAGNOSIS — R079 Chest pain, unspecified: Secondary | ICD-10-CM | POA: Diagnosis not present

## 2022-10-04 DIAGNOSIS — Y92410 Unspecified street and highway as the place of occurrence of the external cause: Secondary | ICD-10-CM | POA: Insufficient documentation

## 2022-10-04 DIAGNOSIS — M546 Pain in thoracic spine: Secondary | ICD-10-CM | POA: Diagnosis not present

## 2022-10-04 DIAGNOSIS — M25511 Pain in right shoulder: Secondary | ICD-10-CM | POA: Insufficient documentation

## 2022-10-04 DIAGNOSIS — Z5321 Procedure and treatment not carried out due to patient leaving prior to being seen by health care provider: Secondary | ICD-10-CM | POA: Insufficient documentation

## 2022-10-04 MED ORDER — ACETAMINOPHEN 325 MG PO TABS: 650.0000 mg | ORAL_TABLET | Freq: Four times a day (QID) | ORAL | Status: DC | PRN

## 2022-10-04 NOTE — ED Notes (Signed)
Pt did not want to wait and left going to urgent care in the am.
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
## 2022-10-06 ENCOUNTER — Telehealth: Payer: Self-pay | Admitting: *Deleted
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
## 2022-10-07 ENCOUNTER — Encounter: Payer: Self-pay | Admitting: Nurse Practitioner

## 2022-10-07 ENCOUNTER — Telehealth (INDEPENDENT_AMBULATORY_CARE_PROVIDER_SITE_OTHER): Payer: Medicaid Other | Admitting: Nurse Practitioner

## 2022-10-07 DIAGNOSIS — M791 Myalgia, unspecified site: Secondary | ICD-10-CM | POA: Diagnosis not present

## 2022-10-07 MED ORDER — CYCLOBENZAPRINE HCL 10 MG PO TABS: 10.0000 mg | ORAL_TABLET | Freq: Every day | ORAL | 0 refills | Status: DC
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
## 2022-10-07 NOTE — Assessment & Plan Note (Signed)
Will treat with Cyclobenzaprine. Side effects and benefits of medication discussed.  Xrays reviewed and are unremarkable. Continue with Tylenol and Ibuprofen PRN for pain.  Follow up is symptoms not improved.

## 2022-10-20 ENCOUNTER — Encounter: Payer: Self-pay | Admitting: Nurse Practitioner

## 2022-10-20 ENCOUNTER — Ambulatory Visit: Payer: Medicaid Other | Admitting: Nurse Practitioner

## 2022-10-20 VITALS — BP 105/67 | HR 87 | Temp 98.8°F | Ht 64.02 in | Wt 163.0 lb

## 2022-10-20 DIAGNOSIS — G8929 Other chronic pain: Secondary | ICD-10-CM

## 2022-10-20 DIAGNOSIS — M5441 Lumbago with sciatica, right side: Secondary | ICD-10-CM

## 2022-10-20 DIAGNOSIS — L219 Seborrheic dermatitis, unspecified: Secondary | ICD-10-CM | POA: Diagnosis not present

## 2022-10-20 MED ORDER — FLUOCINONIDE 0.05 % EX SOLN: 1.0000 | Freq: Every day | CUTANEOUS | 2 refills | Status: DC

## 2022-10-20 MED ORDER — KETOCONAZOLE 2 % EX SHAM: 1.0000 | MEDICATED_SHAMPOO | CUTANEOUS | 5 refills | Status: DC

## 2022-10-20 MED ORDER — PREDNISONE 20 MG PO TABS: 40.0000 mg | ORAL_TABLET | Freq: Every day | ORAL | 0 refills | Status: AC

## 2022-10-20 MED ORDER — KETOROLAC TROMETHAMINE 30 MG/ML IJ SOLN
30.0000 mg | Freq: Once | INTRAMUSCULAR | Status: AC
Start: 2022-10-20 — End: 2022-10-20
  Administered 2022-10-20: 30 mg via INTRAMUSCULAR

## 2022-10-20 NOTE — Patient Instructions (Signed)
Seborrheic Dermatitis, Adult Seborrheic dermatitis is a skin disease that causes red, scaly patches. It often occurs on the scalp, where it may be called dandruff. The patches may also appear on other parts of the body. Skin patches tend to occur where there are a lot of oil glands in the skin. Areas of the body that may be affected include: The scalp. The face, eyebrows, and ears. The area around a beard. Skin folds of the body. This includes the armpits, groin, and buttocks. The chest. The condition is often long-lasting (chronic). It may come and go for no known reason. It may be activated by a trigger, such as: Cold weather. Being out in the sun. Stress. Drinking alcohol. What are the causes? The cause of this condition is not known. It may be related to having too much yeast on the skin or changes in how your body's disease-fighting system (immune system) works. What increases the risk? You may be more likely to develop this condition if: You have a weak immune system. You are 50 years old or older. You have other conditions, such as: Human immunodeficiency virus (HIV) or acquired immunodeficiency virus (AIDS). Parkinson's disease. Mood disorders, such as depression. Liver problems. Obesity. What are the signs or symptoms? Symptoms of this condition include: Thick scales on the scalp. Redness on the face or in the armpits. Skin that is flaky. The flakes may be white or yellow. Skin that seems oily or dry but is not helped with moisturizers. Itching or burning in the affected areas. How is this diagnosed? This condition is diagnosed with a medical history and physical exam. A sample of your skin may be tested (skin biopsy). You may need to see a skin specialist (dermatologist). How is this treated? There is no cure for this condition, but treatment can help to manage the symptoms. You may get treatment to remove scales, lower the risk of skin infection, and reduce swelling or  itching. Treatment may include: Medicated shampoos, moisturizing creams, or ointments. Creams that reduce skin yeast. Creams that reduce swelling and irritation (steroids). Follow these instructions at home: Skin care Use any medicated shampoo, skin creams, or ointments only as told by your health care provider. Do not use skin products that contain alcohol. Take lukewarm baths or showers. Avoid very hot water. When you are outside, wear a hat and clothes that block UV light. General instructions Apply over-the-counter and prescription medicines only as told by your health care provider. Learn what triggers your symptoms so you can avoid these things. Use techniques for stress reduction, such as meditation or yoga. Do not drink alcohol if your health care provider tells you not to drink. Keep all follow-up visits. Your health care provider will check your skin to make sure the treatments are helping. Where to find more information American Academy of Dermatology: aad.org Contact a health care provider if: Your symptoms do not get better with treatment. Your symptoms get worse. You have new symptoms. Get help right away if: Your condition quickly gets worse, even with treatment. This information is not intended to replace advice given to you by your health care provider. Make sure you discuss any questions you have with your health care provider. Document Revised: 11/07/2021 Document Reviewed: 11/07/2021 Elsevier Patient Education  2023 Elsevier Inc.  

## 2022-10-20 NOTE — Assessment & Plan Note (Signed)
To scalp area with no benefit from OTC products.  Will start Ketoconazole 2% shampoo two times weekly and Lidex external soln daily for 14 days and then minimize to as needed (only use twice a week as needed).  Avoid products with scent in them and use gentle cleansing products only.  Return in 8 weeks for follow-up with PCP.
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
## 2022-10-23 DIAGNOSIS — S161XXA Strain of muscle, fascia and tendon at neck level, initial encounter: Secondary | ICD-10-CM | POA: Diagnosis not present

## 2022-10-23 DIAGNOSIS — M5459 Other low back pain: Secondary | ICD-10-CM | POA: Diagnosis not present

## 2022-10-29 ENCOUNTER — Ambulatory Visit: Payer: Self-pay

## 2022-10-29 NOTE — Telephone Encounter (Signed)
  Chief Complaint: Medication question Symptoms: Question Frequency:  Pertinent Negatives: Patient denies  Disposition: [] ED /[] Urgent Care (no appt availability in office) / [] Appointment(In office/virtual)/ []  [facility]/ [] Home Care/ [] Refused Recommended Disposition /[] [facility]/ [x]  Follow-up with PCP Additional Notes: Pt has questions about new medication.  Answer in note from recent OV.  Seborrheic dermatitis       To scalp area with no benefit from OTC products.  Will start Ketoconazole 2% shampoo two times weekly and Lidex external soln daily for 14 days and then minimize to as needed (only use twice a week as needed).  Avoid products with scent in them and use gentle cleansing products only.  Return in 8 weeks for follow-up with PCP.         Summary: rx concern   The patient has been previously seen for scalp irritation and been prescribed ketoconazole (NIZORAL) 2 % shampoo [[id number]]  The patient would like to know how long they're supposed to continue using the medication and would like to know if this is supposed to be a long term solution  Please contact the patient further when possible      Seborrheic dermatitis       To scalp area with no benefit from OTC products.  Will start Ketoconazole 2% shampoo two times weekly and Lidex external soln daily for 14 days and then minimize to as needed (only use twice a week as needed).  Avoid products with scent in them and use gentle cleansing products only.  Return in 8 weeks for follow-up with PCP.       Reason for Disposition  Caller has medicine question only, adult not sick, AND triager answers question  Answer Assessment - Initial Assessment Questions 1. NAME of MEDICINE: "What medicine(s) are you calling about?"     Scalp medications 2. QUESTION: "What is your question?" (e.g., double dose of medicine, side effect)     The patient would like to know how long they're supposed to continue using  the medication and would like to know if this is supposed to be a long term solution  3. PRESCRIBER: "Who prescribed the medicine?" Reason: if prescribed by specialist, call should be referred to that group.     Larae Grooms 4. SYMPTOMS: "Do you have any symptoms?" If Yes, ask: "What symptoms are you having?"  "How bad are the symptoms (e.g., mild, moderate, severe)  Protocols used: Medication Question Call-A-AH

## 2022-12-08 ENCOUNTER — Other Ambulatory Visit: Payer: Self-pay | Admitting: Nurse Practitioner

## 2022-12-08 NOTE — Telephone Encounter (Signed)
Requested medication (s) are due for refill today -expired Rx  Requested medication (s) are on the active medication list -yes  Future visit scheduled -no  Last refill: 08/14/21 #90 2RF  Notes to clinic: expired Rx  Requested Prescriptions  Pending Prescriptions Disp Refills   cetirizine (ZYRTEC) 10 MG tablet [Pharmacy Med Name: CETIRIZINE 10MG  TABLETS] 90 tablet 2    Sig: TAKE 1 TABLET(10 MG) BY MOUTH DAILY     Ear, Nose, and Throat:  Antihistamines 2 Passed - 12/08/2022  1:14 PM      Passed - Cr in normal range and within 360 days    Creatinine  Date Value Ref Range Status  12/22/2012 0.77 0.60 - 1.30 mg/dL Final   Creatinine, Ser  Date Value Ref Range Status  09/11/2022 0.82 0.44 - 1.00 mg/dL Final   Creatinine, Urine  Date Value Ref Range Status  04/23/2016 69 mg/dL Final         Passed - Valid encounter within last 12 months    Recent Outpatient Visits           1 month ago Chronic right-sided low back pain with right-sided sciatica   [place] [facility] Cor[place]an, Corrie Dandy T, NP   2 months ago Muscle soreness   [place] [facility] Larae Grooms, NP   4 months ago Annual physical exam   [place] [facility] Larae Grooms, NP   5 months ago Visit for suture removal   [place] [facility] Mecum, Oswaldo Conroy, PA-C   7 months ago Acute cough   [place] [facility] Mecum, Oswaldo Conroy, PA-C                 Requested Prescriptions  Pending Prescriptions Disp Refills   cetirizine (ZYRTEC) 10 MG tablet [Pharmacy Med Name: CETIRIZINE 10MG  TABLETS] 90 tablet 2    Sig: TAKE 1 TABLET(10 MG) BY MOUTH DAILY     Ear, Nose, and Throat:  Antihistamines 2 Passed - 12/08/2022  1:14 PM      Passed - Cr in normal range and within 360 days    Creatinine  Date Value Ref Range Status  12/22/2012 0.77 0.60 - 1.30 mg/dL Final   Creatinine, Ser  Date Value Ref Range Status  09/11/2022 0.82 0.44 -  1.00 mg/dL Final   Creatinine, Urine  Date Value Ref Range Status  04/23/2016 69 mg/dL Final         Passed - Valid encounter within last 12 months    Recent Outpatient Visits           1 month ago Chronic right-sided low back pain with right-sided sciatica   [place] [facility] Aura Dials T, NP   2 months ago Muscle soreness   [place] [facility] Larae Grooms, NP   4 months ago Annual physical exam   [place] [facility] Larae Grooms, NP   5 months ago Visit for suture removal   [place] [facility] Mecum, Oswaldo Conroy, PA-C   7 months ago Acute cough   [place] [facility] Mecum, Oswaldo Conroy, PA-C

## 2022-12-10 ENCOUNTER — Encounter: Payer: Self-pay | Admitting: Family Medicine

## 2022-12-10 ENCOUNTER — Telehealth (INDEPENDENT_AMBULATORY_CARE_PROVIDER_SITE_OTHER): Payer: Medicaid Other | Admitting: Family Medicine

## 2022-12-10 DIAGNOSIS — J0100 Acute maxillary sinusitis, unspecified: Secondary | ICD-10-CM

## 2022-12-10 MED ORDER — CLINDAMYCIN HCL 300 MG PO CAPS: 300.0000 mg | ORAL_CAPSULE | Freq: Three times a day (TID) | ORAL | 0 refills | Status: DC

## 2022-12-10 NOTE — Progress Notes (Signed)
There were no vitals taken for this visit.   Subjective:    [name], female    DOB: 18-Jul-1990, 32 y.o.   MRN: [id number]  HPI: Alyssa Kemp is a 33 y.o. female  Chief Complaint  Patient presents with   Nasal Congestion    Patient says she has been sick since June 4th. Patient says it started the first 3 days with nausea and vomiting. Patient says she took a at-home COVID test and it was negative. Patient says she has tried over the counter medication and says she is feeling slightly better,  but she is still having a productive cough and it is irritating her throat. Patient says she is having a clear to a gray mucus discoloration and it is causing her to choke up.    UPPER RESPIRATORY TRACT INFECTION- had a GI bug about 2 weeks ago and then felt better, but now having more URI issues Duration: 2 weeks Worst symptom: congestion Fever: no Cough: yes Shortness of breath: yes Wheezing: no Chest pain: yes, with cough Chest tightness: no Chest congestion: no Nasal congestion: yes Runny nose: yes Post nasal drip: yes Sneezing: no Sore throat: yes Swollen glands: no Sinus pressure: yes Headache: yes Face pain: yes Toothache: no Ear pain: yes  Ear pressure: no  Eyes red/itching:no Eye drainage/crusting: no  Vomiting: no Rash: no Fatigue: yes Sick contacts: no Strep contacts: no  Context: worse Recurrent sinusitis: no Relief with OTC cold/cough medications: no  Treatments attempted: cold/sinus, mucinex, anti-histamine, pseudoephedrine, and cough syrup   Relevant past medical, surgical, family and social history reviewed and updated as indicated. Interim medical history since our last visit reviewed. Allergies and medications reviewed and updated.  Review of Systems  Constitutional: Negative.   HENT:  Positive for congestion, postnasal drip, rhinorrhea, sinus pressure and sinus pain. Negative for dental problem, drooling, ear  discharge, ear pain, facial swelling, hearing loss, mouth sores, nosebleeds, sneezing, sore throat, tinnitus, trouble swallowing and voice change.   Eyes: Negative.   Respiratory:  Positive for cough. Negative for apnea, choking, chest tightness, shortness of breath, wheezing and stridor.   Cardiovascular: Negative.   Gastrointestinal: Negative.   Psychiatric/Behavioral: Negative.      Per HPI unless specifically indicated above     Objective:    There were no vitals taken for this visit.  Wt Readings from Last 3 Encounters:  10/20/22 163 lb (73.9 kg)  10/04/22 166 lb (75.3 kg)  09/28/22 166 lb (75.3 kg)    Physical Exam Vitals and nursing note reviewed.  Constitutional:      General: She is not in acute distress.    Appearance: Normal appearance. She is normal weight. She is not ill-appearing, toxic-appearing or diaphoretic.  HENT:     Head: Normocephalic and atraumatic.     Right Ear: External ear normal.     Left Ear: External ear normal.     Nose: Nose normal.     Mouth/Throat:     Mouth: Mucous membranes are moist.     Pharynx: Oropharynx is clear.  Eyes:     General: No scleral icterus.       Right eye: No discharge.        Left eye: No discharge.     Conjunctiva/sclera: Conjunctivae normal.     Pupils: Pupils are equal, round, and reactive to light.  Pulmonary:     Effort: Pulmonary effort is normal. No respiratory distress.     Comments:  Speaking in full sentences Musculoskeletal:        General: Normal range of motion.     Cervical back: Normal range of motion.  Skin:    Coloration: Skin is not jaundiced or pale.     Findings: No bruising, erythema, lesion or rash.  Neurological:     Mental Status: She is alert and oriented to person, place, and time. Mental status is at baseline.  Psychiatric:        Mood and Affect: Mood normal.        Behavior: Behavior normal.        Thought Content: Thought content normal.        Judgment: Judgment normal.      Results for orders placed or performed during the hospital encounter of 09/11/22  CBC  Result Value Ref Range   WBC 8.9 4.0 - 10.5 K/uL   RBC 4.18 3.87 - 5.11 MIL/uL   Hemoglobin 12.6 12.0 - 15.0 g/dL   HCT 54.0 (L) 98.1 - 19.1 %   MCV 84.2 80.0 - 100.0 fL   MCH 30.1 26.0 - 34.0 pg   MCHC 35.8 30.0 - 36.0 g/dL   RDW 47.8 29.5 - 62.1 %   Platelets 247 150 - 400 K/uL   nRBC 0.0 0.0 - 0.2 %  Comprehensive metabolic panel  Result Value Ref Range   Sodium 134 (L) 135 - 145 mmol/L   Potassium 4.1 3.5 - 5.1 mmol/L   Chloride 105 98 - 111 mmol/L   CO2 21 (L) 22 - 32 mmol/L   Glucose, Bld 102 (H) 70 - 99 mg/dL   BUN 12 6 - 20 mg/dL   Creatinine, Ser 3.08 0.44 - 1.00 mg/dL   Calcium 9.2 8.9 - 65.7 mg/dL   Total Protein 6.6 6.5 - 8.1 g/dL   Albumin 4.2 3.5 - 5.0 g/dL   AST 19 15 - 41 U/L   ALT 14 0 - 44 U/L   Alkaline Phosphatase 40 38 - 126 U/L   Total Bilirubin 0.6 0.3 - 1.2 mg/dL   GFR, Estimated >84 >69 mL/min   Anion gap 8 5 - 15  I-Stat beta hCG blood, ED  Result Value Ref Range   I-stat hCG, quantitative <5.0 <5 mIU/mL   Comment 3          Pregnancy, urine POC  Result Value Ref Range   Preg Test, Ur NEGATIVE NEGATIVE      Assessment & Plan:   Problem List Items Addressed This Visit   None Visit Diagnoses     Acute non-recurrent maxillary sinusitis    -  Primary   Will treat with clindamycin due to allergies. Call with any concerns. Continue to monitor. Call with any concerns.   Relevant Medications   clindamycin (CLEOCIN) 300 MG capsule        Follow up plan: Return if symptoms worsen or fail to improve.    This visit was completed via video visit through MyChart due to the restrictions of the COVID-19 pandemic. All issues as above were discussed and addressed. Physical exam was done as above through visual confirmation on video through MyChart. If it was felt that the patient should be evaluated in the office, they were directed there. The patient  verbally consented to this visit. Location of the patient: home Location of the provider: work Those involved with this call:  Provider: Olevia Perches, DO CMA: Malen Gauze, CMA Front Desk/Registration:  Servando Snare   Time spent on call:  15 minutes with patient face to face via video conference. More than 50% of this time was spent in counseling and coordination of care. 23 minutes total spent in review of patient's record and preparation of their chart.

## 2023-01-13 ENCOUNTER — Telehealth: Payer: Self-pay | Admitting: Nurse Practitioner

## 2023-01-13 ENCOUNTER — Ambulatory Visit: Payer: Self-pay

## 2023-01-13 NOTE — Telephone Encounter (Signed)
Referral Request - Has patient seen PCP for this complaint? Yes.  She stated she received an injection for this before from PCP  Referral for which specialty: Spine Dr.  Preferred provider/office:  [facility] - [facility] [address], [place], [place] [address] [phone]  Reason for referral: patient is wanting to switch spine doctors because she has been going to [facility] and wants to switch doctors closer to her home for travel purposes     Patients callback #  [phone]

## 2023-01-13 NOTE — Telephone Encounter (Signed)
   Chief Complaint: Low back pain Symptoms: Above Frequency: Has had back pain for "years" but worse recently Pertinent Negatives: Patient denies  Disposition: [] ED /[] Urgent Care (no appt availability in office) / [x] Appointment(In office/virtual)/ []  [facility]/ [] Home Care/ [] Refused Recommended Disposition /[]  Mobile Bus/ []  Follow-up with PCP Additional Notes: Pt. Agrees with appointment.   Reason for Disposition  [1] MODERATE back pain (e.g., interferes with normal activities) AND [2] present > 3 days  Answer Assessment - Initial Assessment Questions 1. ONSET: "When did the pain begin?"      Has gotten worse recently 2. LOCATION: "Where does it hurt?" (upper, mid or lower back)     Low back 3. SEVERITY: "How bad is the pain?"  (e.g., Scale 1-10; mild, moderate, or severe)   - MILD (1-3): Doesn't interfere with normal activities.    - MODERATE (4-7): Interferes with normal activities or awakens from sleep.    - SEVERE (8-10): Excruciating pain, unable to do any normal activities.      7-8 4. PATTERN: "Is the pain constant?" (e.g., yes, no; constant, intermittent)      Constant 5. RADIATION: "Does the pain shoot into your legs or somewhere else?"     Yes 6. CAUSE:  "What do you think is causing the back pain?"       7. BACK OVERUSE:  "Any recent lifting of heavy objects, strenuous work or exercise?"     No 8. MEDICINES: "What have you taken so far for the pain?" (e.g., nothing, acetaminophen, NSAIDS)     No 9. NEUROLOGIC SYMPTOMS: "Do you have any weakness, numbness, or problems with bowel/bladder control?"     No 10. OTHER SYMPTOMS: "Do you have any other symptoms?" (e.g., fever, abdomen pain, burning with urination, blood in urine)       No 11. PREGNANCY: "Is there any chance you are pregnant?" "When was your last menstrual period?"       No  Protocols used: Back Pain-A-AH

## 2023-01-14 ENCOUNTER — Ambulatory Visit: Payer: Medicaid Other | Admitting: Nurse Practitioner

## 2023-01-18 ENCOUNTER — Ambulatory Visit: Payer: Medicaid Other | Admitting: Family Medicine

## 2023-01-18 ENCOUNTER — Other Ambulatory Visit: Payer: Self-pay | Admitting: Physician Assistant

## 2023-01-18 DIAGNOSIS — G8929 Other chronic pain: Secondary | ICD-10-CM

## 2023-01-26 DIAGNOSIS — M47816 Spondylosis without myelopathy or radiculopathy, lumbar region: Secondary | ICD-10-CM | POA: Diagnosis not present

## 2023-01-26 DIAGNOSIS — G8929 Other chronic pain: Secondary | ICD-10-CM | POA: Diagnosis not present

## 2023-01-26 DIAGNOSIS — M47899 Other spondylosis, site unspecified: Secondary | ICD-10-CM | POA: Diagnosis not present

## 2023-01-26 DIAGNOSIS — M545 Low back pain, unspecified: Secondary | ICD-10-CM | POA: Diagnosis not present

## 2023-01-26 DIAGNOSIS — M47817 Spondylosis without myelopathy or radiculopathy, lumbosacral region: Secondary | ICD-10-CM | POA: Diagnosis not present

## 2023-02-08 DIAGNOSIS — M47899 Other spondylosis, site unspecified: Secondary | ICD-10-CM | POA: Diagnosis not present

## 2023-02-16 ENCOUNTER — Telehealth: Payer: Self-pay | Admitting: *Deleted

## 2023-02-16 NOTE — Telephone Encounter (Signed)
Left patient a message to call and schedule annual for September per Dr. Charlotta Newton.

## 2023-02-28 ENCOUNTER — Other Ambulatory Visit: Payer: Self-pay | Admitting: Nurse Practitioner

## 2023-03-02 NOTE — Telephone Encounter (Signed)
Requested medication (s) are due for refill today:   Requested medication (s) are on the active medication list: No  Last refill:  10/20/22  Future visit scheduled: Yes  Notes to clinic:  Not on medication list. Manual review.    Requested Prescriptions  Pending Prescriptions Disp Refills   fluocinonide (LIDEX) 0.05 % external solution [Pharmacy Med Name: FLUOCINONIDE 0.05% SOLN 20ML] 60 mL 2    Sig: APPLY 1 APPLICATION DAILY FOR 14 DAYS. STOP USING DAILY IN 14 DAYS AND ONLY USE AS NEEDED(NO MORE THAN TWICE A WEEK     Off-Protocol Failed - 02/28/2023 10:46 AM      Failed - Medication not assigned to a protocol, review manually.      Passed - Valid encounter within last 12 months    Recent Outpatient Visits           2 months ago Acute non-recurrent maxillary sinusitis   [place] Perry, Megan P, DO   4 months ago Chronic right-sided low back pain with right-sided sciatica   [place] [facility] [address] [place], Corrie Dandy T, NP   4 months ago Muscle soreness   [place] [facility] Larae Grooms, NP   7 months ago Annual physical exam   [place] [facility] Larae Grooms, NP   8 months ago Visit for suture removal   [place] [facility] M[place]um, Oswaldo Conroy, PA-C

## 2023-03-05 ENCOUNTER — Ambulatory Visit: Payer: Medicaid Other | Admitting: Obstetrics and Gynecology

## 2023-03-18 DIAGNOSIS — G8929 Other chronic pain: Secondary | ICD-10-CM | POA: Diagnosis not present

## 2023-03-18 DIAGNOSIS — M47899 Other spondylosis, site unspecified: Secondary | ICD-10-CM | POA: Diagnosis not present

## 2023-03-18 DIAGNOSIS — M47816 Spondylosis without myelopathy or radiculopathy, lumbar region: Secondary | ICD-10-CM | POA: Diagnosis not present

## 2023-04-11 DIAGNOSIS — M940 Chondrocostal junction syndrome [Tietze]: Secondary | ICD-10-CM | POA: Diagnosis not present

## 2023-04-11 DIAGNOSIS — Z20822 Contact with and (suspected) exposure to covid-19: Secondary | ICD-10-CM | POA: Diagnosis not present

## 2023-04-11 DIAGNOSIS — J209 Acute bronchitis, unspecified: Secondary | ICD-10-CM | POA: Diagnosis not present

## 2023-04-12 ENCOUNTER — Telehealth: Payer: Self-pay | Admitting: Nurse Practitioner

## 2023-04-12 NOTE — Telephone Encounter (Signed)
Pt is calling to request medical records and billing for office visit 10/20/2022 CB- [phone]

## 2023-04-13 NOTE — Telephone Encounter (Signed)
Request for medical Records have need received and will be faxed to medical records.

## 2023-04-16 ENCOUNTER — Ambulatory Visit: Payer: Medicaid Other | Admitting: Obstetrics and Gynecology

## 2023-04-26 DIAGNOSIS — F4310 Post-traumatic stress disorder, unspecified: Secondary | ICD-10-CM | POA: Diagnosis not present

## 2023-04-26 DIAGNOSIS — F331 Major depressive disorder, recurrent, moderate: Secondary | ICD-10-CM | POA: Diagnosis not present

## 2023-04-30 ENCOUNTER — Ambulatory Visit: Payer: Medicaid Other | Admitting: Obstetrics and Gynecology

## 2023-05-06 DIAGNOSIS — F331 Major depressive disorder, recurrent, moderate: Secondary | ICD-10-CM | POA: Diagnosis not present

## 2023-05-11 NOTE — Progress Notes (Unsigned)
ANNUAL EXAM Patient name: Alyssa Kemp MRN [id number]  Date of birth: [birth year] Chief Complaint:   No chief complaint on file.  History of Present Illness:   Alyssa Kemp is a 32 y.o. [phone] with No LMP recorded. being seen today for a routine annual exam.  Current complaints: ***   S/p BTL - planned tubal reversal?   The pregnancy intention screening data noted above was reviewed. Potential methods of contraception were discussed. The patient elected to proceed with No data recorded.   Last pap 03/06/21 NILM, no HPV testing. H/O abnormal pap: yes LSIL 2021  - needs pap Last mammogram: n/a. Results were: N/A. Family h/o breast cancer: {yes***/no:23838} Last colonoscopy: n/a. Results were: N/A. Family h/o colorectal cancer: {yes***/no:23838} HPV vaccine: 2022 - any additional doses?     10/20/2022    3:26 PM 07/21/2022    8:27 AM 07/02/2022    8:42 AM 04/09/2022    3:37 PM 03/06/2021   10:19 AM  Depression screen PHQ 2/9  Decreased Interest 2 0 0 0 0  Down, Depressed, Hopeless 0 0 0 0 0  PHQ - 2 Score 2 0 0 0 0  Altered sleeping 2 0 0 0   Tired, decreased energy 2 0 0 0   Change in appetite 0 0 0 0   Feeling bad or failure about yourself  0 0 0 0   Trouble concentrating 1 1 0 0   Moving slowly or fidgety/restless 0 0 0 0   Suicidal thoughts 0 0 0 0   PHQ-9 Score 7 1 0 0   Difficult doing work/chores  Not difficult at all  Not difficult at all         10/20/2022    3:26 PM 07/21/2022    8:27 AM 07/02/2022    8:42 AM 04/09/2022    3:37 PM  GAD 7 : Generalized Anxiety Score  Nervous, Anxious, on Edge 2 0 0 0  Control/stop worrying 1 0 0 0  Worry too much - different things 0 0 0 0  Trouble relaxing 1 0 0 0  Restless 0 0 0 0  Easily annoyed or irritable 0 0 0 0  Afraid - awful might happen 0 0 0 0  Total GAD 7 Score 4 0 0 0  Anxiety Difficulty  Not difficult at all Not difficult at all Not difficult at all     Review of Systems:   Pertinent  items are noted in HPI Denies any headaches, blurred vision, fatigue, shortness of breath, chest pain, abdominal pain, abnormal vaginal discharge/itching/odor/irritation, problems with periods, bowel movements, urination, or intercourse unless otherwise stated above. Pertinent History Reviewed:  Reviewed past medical,surgical, social and family history.  Reviewed problem list, medications and allergies. Physical Assessment:  There were no vitals filed for this visit.There is no height or weight on file to calculate BMI.        Physical Examination:   General appearance - well appearing, and in no distress  Mental status - alert, oriented to person, place, and time  Chest - respiratory effort normal  Heart - normal peripheral perfusion  Breasts - breasts appear normal, no suspicious masses, no skin or nipple changes or axillary nodes  Abdomen - soft, nontender, nondistended, no masses or organomegaly  Pelvic - VULVA: normal appearing vulva with no masses, tenderness or lesions  VAGINA: normal appearing vagina with normal color and discharge, no lesions  CERVIX: normal appearing cervix without discharge or lesions, no  CMT  Thin prep pap is {Desc; done/not:10129} *** HR HPV cotesting  UTERUS: uterus is felt to be normal size, shape, consistency and nontender   ADNEXA: No adnexal masses or tenderness noted.  Chaperone present for exam  No results found for this or any previous visit (from the past 24 hour(s)).  Assessment & Plan:  1) Well-Woman Exam Mammogram: {Mammo f/u:25212::"@ 32yo"}, or sooner if problems Colonoscopy: {TCS f/u:25213::"@ 32yo"}, or sooner if problems Pap: Collected today with HPV Gardasil: GC/CT: HIV/HCV:  2) ***  Labs/procedures today: ***  No orders of the defined types were placed in this encounter.   Meds: No orders of the defined types were placed in this encounter.   Follow-up: No follow-ups on file.  Lennart Pall, MD 05/11/2023 8:24 PM

## 2023-05-13 ENCOUNTER — Ambulatory Visit: Payer: Medicaid Other | Admitting: Obstetrics and Gynecology

## 2023-05-13 ENCOUNTER — Other Ambulatory Visit (HOSPITAL_COMMUNITY)
Admission: RE | Admit: 2023-05-13 | Discharge: 2023-05-13 | Disposition: A | Discharge status: Home / Self Care | Payer: Medicaid Other | Source: Ambulatory Visit | Attending: Obstetrics and Gynecology | Admitting: Obstetrics and Gynecology

## 2023-05-13 ENCOUNTER — Encounter: Payer: Self-pay | Admitting: Obstetrics and Gynecology

## 2023-05-13 VITALS — BP 128/84 | HR 74 | Ht 64.0 in | Wt 160.0 lb

## 2023-05-13 DIAGNOSIS — Z01419 Encounter for gynecological examination (general) (routine) without abnormal findings: Secondary | ICD-10-CM

## 2023-05-13 DIAGNOSIS — Z1151 Encounter for screening for human papillomavirus (HPV): Secondary | ICD-10-CM | POA: Diagnosis not present

## 2023-05-14 DIAGNOSIS — F331 Major depressive disorder, recurrent, moderate: Secondary | ICD-10-CM | POA: Diagnosis not present

## 2023-05-14 LAB — RPR: RPR Ser Ql: NONREACTIVE

## 2023-05-14 LAB — HEPATITIS C ANTIBODY: Hep C Virus Ab: NONREACTIVE

## 2023-05-14 LAB — HIV ANTIBODY (ROUTINE TESTING W REFLEX): HIV Screen 4th Generation wRfx: NONREACTIVE

## 2023-05-19 LAB — CYTOLOGY - PAP
Chlamydia: NEGATIVE
Comment: NEGATIVE
Comment: NEGATIVE
Comment: NORMAL
Diagnosis: NEGATIVE
High risk HPV: NEGATIVE
Neisseria Gonorrhea: NEGATIVE

## 2023-06-02 ENCOUNTER — Ambulatory Visit: Payer: Medicaid Other | Admitting: Nurse Practitioner

## 2023-06-02 ENCOUNTER — Encounter: Payer: Self-pay | Admitting: Nurse Practitioner

## 2023-06-02 VITALS — BP 113/71 | HR 88 | Temp 98.3°F | Ht 64.0 in | Wt 159.8 lb

## 2023-06-02 DIAGNOSIS — R051 Acute cough: Secondary | ICD-10-CM | POA: Diagnosis not present

## 2023-06-02 DIAGNOSIS — R197 Diarrhea, unspecified: Secondary | ICD-10-CM | POA: Diagnosis not present

## 2023-06-02 MED ORDER — CLINDAMYCIN HCL 300 MG PO CAPS: 300.0000 mg | ORAL_CAPSULE | Freq: Two times a day (BID) | ORAL | 0 refills | Status: DC

## 2023-06-02 NOTE — Progress Notes (Signed)
BP 113/71 (BP Location: Left Arm, Patient Position: Sitting, Cuff Size: Large)   Pulse 88   Temp 98.3 F (36.8 C) (Oral)   Ht 5\' 4"  (1.626 m)   Wt 159 lb 12.8 oz (72.5 kg)   LMP 04/25/2023   SpO2 98%   BMI 27.43 kg/m    Subjective:    [name], female    DOB: September 18, 1990, 32 y.o.   MRN: [id number]  HPI: Alyssa Kemp is a 32 y.o. female  Chief Complaint  Patient presents with   GI Problem   URI    Cold since the 25th of November, symptoms have been coughing up mucus, irritated throat due to coughing up mucus, prior treatment has been OTC sinus and cold   Diarrhea    Has been going on for awhile, blood in sttol as well as mucus, has had 3 bowel movements this morning    UPPER RESPIRATORY TRACT INFECTION-  Symptoms have been ongoing since November 25.  Also having some diarrhea. She has already had diarrhea 3x today Duration: 2 weeks Worst symptom: congestion Fever: no Cough: yes Shortness of breath: no Wheezing: no Chest pain: no Chest tightness: no Chest congestion: no Nasal congestion: yes Runny nose: yes Post nasal drip: yes Sneezing: no Sore throat: yes- irritated Swollen glands: no Sinus pressure: yes Headache: yes Face pain: no Toothache: no Ear pain: no Ear pressure:yes  Eyes red/itching:no Eye drainage/crusting: no  Diarrhea: yes Rash: no Fatigue: yes Sick contacts: no Strep contacts: no  Context: worse Recurrent sinusitis: no Relief with OTC cold/cough medications: no  Treatments attempted: cold/sinus  Relevant past medical, surgical, family and social history reviewed and updated as indicated. Interim medical history since our last visit reviewed. Allergies and medications reviewed and updated.  Review of Systems  Constitutional: Negative.   HENT:  Positive for congestion, postnasal drip, rhinorrhea, sinus pressure and sinus pain. Negative for dental problem, drooling, ear discharge, ear pain, facial  swelling, hearing loss, mouth sores, nosebleeds, sneezing, sore throat, tinnitus, trouble swallowing and voice change.   Eyes: Negative.   Respiratory:  Positive for cough. Negative for apnea, choking, chest tightness, shortness of breath, wheezing and stridor.   Cardiovascular: Negative.   Gastrointestinal: Negative.   Psychiatric/Behavioral: Negative.      Per HPI unless specifically indicated above     Objective:    BP 113/71 (BP Location: Left Arm, Patient Position: Sitting, Cuff Size: Large)   Pulse 88   Temp 98.3 F (36.8 C) (Oral)   Ht 5\' 4"  (1.626 m)   Wt 159 lb 12.8 oz (72.5 kg)   LMP 04/25/2023   SpO2 98%   BMI 27.43 kg/m   Wt Readings from Last 3 Encounters:  06/02/23 159 lb 12.8 oz (72.5 kg)  05/13/23 160 lb (72.6 kg)  10/20/22 163 lb (73.9 kg)    Physical Exam Vitals and nursing note reviewed.  Constitutional:      General: She is not in acute distress.    Appearance: Normal appearance. She is normal weight. She is not ill-appearing, toxic-appearing or diaphoretic.  HENT:     Head: Normocephalic and atraumatic.     Right Ear: Tympanic membrane and external ear normal.     Left Ear: Tympanic membrane and external ear normal.     Nose: Congestion and rhinorrhea present.     Mouth/Throat:     Mouth: Mucous membranes are moist.     Pharynx: Oropharynx is clear. Posterior oropharyngeal erythema present.  No oropharyngeal exudate.  Eyes:     General: No scleral icterus.       Right eye: No discharge.        Left eye: No discharge.     Conjunctiva/sclera: Conjunctivae normal.     Pupils: Pupils are equal, round, and reactive to light.  Pulmonary:     Effort: Pulmonary effort is normal. No respiratory distress.     Comments: Speaking in full sentences Musculoskeletal:        General: Normal range of motion.     Cervical back: Normal range of motion.  Skin:    Coloration: Skin is not jaundiced or pale.     Findings: No bruising, erythema, lesion or rash.   Neurological:     Mental Status: She is alert and oriented to person, place, and time. Mental status is at baseline.  Psychiatric:        Mood and Affect: Mood normal.        Behavior: Behavior normal.        Thought Content: Thought content normal.        Judgment: Judgment normal.     Results for orders placed or performed in visit on 05/13/23  HIV antibody (with reflex)  Result Value Ref Range   HIV Screen 4th Generation wRfx Non Reactive Non Reactive  Hepatitis C Antibody  Result Value Ref Range   Hep C Virus Ab Non Reactive Non Reactive  RPR  Result Value Ref Range   RPR Ser Ql Non Reactive Non Reactive  Cytology - PAP( [facility])  Result Value Ref Range   High risk HPV Negative    Neisseria Gonorrhea Negative    Chlamydia Negative    Adequacy      Satisfactory for evaluation; transformation zone component PRESENT.   Diagnosis      - Negative for intraepithelial lesion or malignancy (NILM)   Comment Normal Reference Ranger Chlamydia - Negative    Comment      Normal Reference Range Neisseria Gonorrhea - Negative   Comment Normal Reference Range HPV - Negative       Assessment & Plan:   Problem List Items Addressed This Visit   None Visit Diagnoses     Acute cough    -  Primary   Will treat with clindamycin due to ongoing symptoms.  Complete course of antibiotics. Continue with OTC symptom management.   Diarrhea, unspecified type       Symptoms will likely continue to improve.  If not, can see GI.  Referral placed today.   Relevant Orders   Ambulatory referral to Gastroenterology         Follow up plan: Return if symptoms worsen or fail to improve.

## 2023-06-03 DIAGNOSIS — F331 Major depressive disorder, recurrent, moderate: Secondary | ICD-10-CM | POA: Diagnosis not present

## 2023-06-03 DIAGNOSIS — F4310 Post-traumatic stress disorder, unspecified: Secondary | ICD-10-CM | POA: Diagnosis not present

## 2023-06-04 DIAGNOSIS — F331 Major depressive disorder, recurrent, moderate: Secondary | ICD-10-CM | POA: Diagnosis not present

## 2023-06-14 DIAGNOSIS — M47899 Other spondylosis, site unspecified: Secondary | ICD-10-CM | POA: Diagnosis not present

## 2023-06-14 DIAGNOSIS — M47816 Spondylosis without myelopathy or radiculopathy, lumbar region: Secondary | ICD-10-CM | POA: Diagnosis not present

## 2023-06-18 DIAGNOSIS — F331 Major depressive disorder, recurrent, moderate: Secondary | ICD-10-CM | POA: Diagnosis not present

## 2023-06-21 ENCOUNTER — Telehealth: Payer: Self-pay | Admitting: Nurse Practitioner

## 2023-06-21 NOTE — Telephone Encounter (Signed)
Copied from CRM [id number]. Topic: General - Inquiry >> Jun 21, 2023  9:03 AM Alyssa Kemp T wrote: Reason for CRM: [facility] in [place] is requesting most recent CBC lab results faxed to [phone].

## 2023-06-22 NOTE — Telephone Encounter (Signed)
Lab results printed and faxed as requested.

## 2023-06-24 DIAGNOSIS — M5126 Other intervertebral disc displacement, lumbar region: Secondary | ICD-10-CM | POA: Diagnosis not present

## 2023-06-24 DIAGNOSIS — M48061 Spinal stenosis, lumbar region without neurogenic claudication: Secondary | ICD-10-CM | POA: Diagnosis not present

## 2023-06-24 DIAGNOSIS — M47816 Spondylosis without myelopathy or radiculopathy, lumbar region: Secondary | ICD-10-CM | POA: Diagnosis not present

## 2023-06-29 NOTE — Telephone Encounter (Signed)
 Pt is aware request has been done.

## 2023-06-30 DIAGNOSIS — F4310 Post-traumatic stress disorder, unspecified: Secondary | ICD-10-CM | POA: Diagnosis not present

## 2023-06-30 DIAGNOSIS — F331 Major depressive disorder, recurrent, moderate: Secondary | ICD-10-CM | POA: Diagnosis not present

## 2023-07-07 DIAGNOSIS — F331 Major depressive disorder, recurrent, moderate: Secondary | ICD-10-CM | POA: Diagnosis not present

## 2023-07-15 DIAGNOSIS — M47899 Other spondylosis, site unspecified: Secondary | ICD-10-CM | POA: Diagnosis not present

## 2023-07-15 DIAGNOSIS — G8929 Other chronic pain: Secondary | ICD-10-CM | POA: Diagnosis not present

## 2023-07-15 DIAGNOSIS — Z791 Long term (current) use of non-steroidal anti-inflammatories (NSAID): Secondary | ICD-10-CM | POA: Diagnosis not present

## 2023-07-15 DIAGNOSIS — M47816 Spondylosis without myelopathy or radiculopathy, lumbar region: Secondary | ICD-10-CM | POA: Diagnosis not present

## 2023-07-15 DIAGNOSIS — Z79899 Other long term (current) drug therapy: Secondary | ICD-10-CM | POA: Diagnosis not present

## 2023-07-26 ENCOUNTER — Ambulatory Visit: Payer: Medicaid Other | Admitting: Nurse Practitioner

## 2023-07-26 ENCOUNTER — Encounter: Payer: Self-pay | Admitting: Nurse Practitioner

## 2023-07-26 VITALS — BP 133/88 | HR 77 | Ht 64.0 in | Wt 163.8 lb

## 2023-07-26 DIAGNOSIS — Z0000 Encounter for general adult medical examination without abnormal findings: Secondary | ICD-10-CM | POA: Diagnosis not present

## 2023-07-26 DIAGNOSIS — Z136 Encounter for screening for cardiovascular disorders: Secondary | ICD-10-CM | POA: Diagnosis not present

## 2023-07-26 LAB — URINALYSIS, ROUTINE W REFLEX MICROSCOPIC
Bilirubin, UA: NEGATIVE
Glucose, UA: NEGATIVE
Ketones, UA: NEGATIVE
Leukocytes,UA: NEGATIVE
Nitrite, UA: NEGATIVE
Protein,UA: NEGATIVE
Specific Gravity, UA: 1.015 (ref 1.005–1.030)
Urobilinogen, Ur: 0.2 mg/dL (ref 0.2–1.0)
pH, UA: 7 (ref 5.0–7.5)

## 2023-07-26 LAB — MICROSCOPIC EXAMINATION: Bacteria, UA: NONE SEEN

## 2023-07-26 MED ORDER — FLUOCINONIDE 0.05 % EX SOLN: 1.0000 | Freq: Two times a day (BID) | CUTANEOUS | 1 refills | Status: DC

## 2023-07-26 MED ORDER — KETOCONAZOLE 1 % EX SHAM: 1.0000 | MEDICATED_SHAMPOO | CUTANEOUS | 1 refills | Status: DC | PRN

## 2023-07-26 MED ORDER — OMEPRAZOLE 20 MG PO CPDR: 20.0000 mg | DELAYED_RELEASE_CAPSULE | Freq: Every day | ORAL | 1 refills | Status: AC

## 2023-07-26 NOTE — Progress Notes (Signed)
BP 133/88 (BP Location: Left Arm, Patient Position: Sitting, Cuff Size: Large)   Pulse 77   Ht 5\' 4"  (1.626 m)   Wt 163 lb 12.8 oz (74.3 kg)   SpO2 99%   BMI 28.12 kg/m    Subjective:    [name], female    DOB: 1991-05-31, 33 y.o.   MRN: [id number]  HPI: Alyssa Kemp is a 33 y.o. female presenting on 07/26/2023 for comprehensive medical examination. Current medical complaints include:none  She currently lives with: Menopausal Symptoms: no   Denies HA, CP, SOB, dizziness, palpitations, visual changes, and lower extremity swelling.  Depression Screen done today and results listed below:     07/26/2023    9:37 AM 10/20/2022    3:26 PM 07/21/2022    8:27 AM 07/02/2022    8:42 AM 04/09/2022    3:37 PM  Depression screen PHQ 2/9  Decreased Interest 0 2 0 0 0  Down, Depressed, Hopeless 0 0 0 0 0  PHQ - 2 Score 0 2 0 0 0  Altered sleeping 0 2 0 0 0  Tired, decreased energy 0 2 0 0 0  Change in appetite 0 0 0 0 0  Feeling bad or failure about yourself  0 0 0 0 0  Trouble concentrating 0 1 1 0 0  Moving slowly or fidgety/restless 0 0 0 0 0  Suicidal thoughts 0 0 0 0 0  PHQ-9 Score 0 7 1 0 0  Difficult doing work/chores   Not difficult at all  Not difficult at all    The patient does not have a history of falls. I did complete a risk assessment for falls. A plan of care for falls was documented.   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   Scoliosis     Surgical History:  Past Surgical History:  Procedure Laterality Date   ADENOIDECTOMY     COLPOSCOPY  01/15/2016   CIN 1   CYSTOSCOPY W/ RETROGRADES Bilateral 12/28/2017   Procedure: CYSTOSCOPY WITH RETROGRADE PYELOGRAM;  Surgeon: [name], MD;  Location: [facility];  Service: Urology;  Laterality: Bilateral;   TONSILLECTOMY      TUBAL LIGATION Bilateral 09/19/2016   Procedure: POST PARTUM TUBAL LIGATION;  Surgeon: [name], MD;  Location: [facility];  Service: Gynecology;  Laterality: Bilateral;   WISDOM TOOTH EXTRACTION      Medications:  Current Outpatient Medications on File Prior to Visit  Medication Sig   escitalopram (LEXAPRO) 10 MG tablet Take 10 mg by mouth daily.   cetirizine (ZYRTEC) 10 MG tablet TAKE 1 TABLET(10 MG) BY MOUTH DAILY   EPINEPHrine (EPIPEN 2-PAK) 0.3 mg/0.3 mL IJ SOAJ injection Inject 0.3 mLs (0.3 mg total) into the muscle as needed for anaphylaxis.   methocarbamol (ROBAXIN) 500 MG tablet Take 500 mg by mouth 3 (three) times daily.   Multiple Vitamin (MULTIVITAMINS PO) Take by mouth daily.   No current facility-administered medications on file prior to visit.    Allergies:  Allergies  Allergen Reactions   Sulfacetamide Sodium Shortness Of Breath   Sulfasalazine Shortness Of Breath   Doxycycline Swelling   Vyvanse [Lisdexamfetamine] Rash   Ciprofloxacin Other (See Comments)    doesn't recall   Macrobid [Nitrofurantoin] Hives   Amoxicillin-Pot Clavulanate Other (See Comments)  Unknown- from childhood    Social History:  Social History   Socioeconomic History   Marital status: Single    Spouse name: Not on file   Number of children: Not on file   Years of education: Not on file   Highest education level: GED or equivalent  Occupational History   Not on file  Tobacco Use   Smoking status: Never   Smokeless tobacco: Never  Vaping Use   Vaping status: Never Used  Substance and Sexual Activity   Alcohol use: No    Comment: socially   Drug use: No   Sexual activity: Yes    Birth control/protection: None, Surgical    Comment: BTL  Other Topics Concern   Not on file  Social History Narrative   Not on file   Social Drivers of Health   Financial Resource Strain: Low Risk  (07/25/2023)   Overall Financial Resource Strain (CARDIA)    Difficulty of Paying  Living Expenses: Not very hard  Food Insecurity: Food Insecurity Present (07/25/2023)   Hunger Vital Sign    Worried About Running Out of Food in the Last Year: Never true    Ran Out of Food in the Last Year: Sometimes true  Transportation Needs: No Transportation Needs (07/25/2023)   PRAPARE - Administrator, Civil Service (Medical): No    Lack of Transportation (Non-Medical): No  Physical Activity: Sufficiently Active (07/25/2023)   Exercise Vital Sign    Days of Exercise per Week: 5 days    Minutes of Exercise per Session: 60 min  Stress: No Stress Concern Present (07/25/2023)   [employer] of Occupational Health - Occupational Stress Questionnaire    Feeling of Stress : Not at all  Social Connections: Moderately Integrated (07/25/2023)   Social Connection and Isolation Panel [NHANES]    Frequency of Communication with Friends and Family: Three times a week    Frequency of Social Gatherings with Friends and Family: Once a week    Attends Religious Services: 1 to 4 times per year    Active Member of [employer] or Organizations: No    Attends Engineer, structural: Not on file    Marital Status: Married  Catering manager Violence: Not At Risk (07/15/2023)   Received from [facility]   HITS    Over the last 12 months how often did your partner physically hurt you?: Never    Over the last 12 months how often did your partner insult you or talk down to you?: Never    Over the last 12 months how often did your partner threaten you with physical harm?: Never    Over the last 12 months how often did your partner scream or curse at you?: Never   Social History   Tobacco Use  Smoking Status Never  Smokeless Tobacco Never   Social History   Substance and Sexual Activity  Alcohol Use No   Comment: socially    Family History:  Family History  Problem Relation Age of Onset   Cervical cancer Mother    Heart disease Father    Pancreatic cancer Maternal Aunt        dx  late 41s   Hodgkin's lymphoma Paternal Aunt        dx 30s   Hypertension Maternal Grandmother    Pancreatic cancer Maternal Grandmother 57   Diabetes Maternal Grandmother    Coronary artery disease Maternal Grandfather    Congestive Heart Failure Maternal Grandfather  Hypertension Maternal Grandfather    Prostate cancer Maternal Grandfather        dx 80s   Diabetes Maternal Grandfather    Hypertension Paternal Grandmother    Diabetes Paternal Grandmother    Heart disease Paternal Grandfather    Hypertension Paternal Grandfather    Lung cancer Paternal Great-grandfather    Stomach cancer Paternal Great-grandmother    Stomach cancer Maternal Great-grandfather     Past medical history, surgical history, medications, allergies, family history and social history reviewed with patient today and changes made to appropriate areas of the chart.   Review of Systems  Eyes:  Negative for blurred vision and double vision.  Respiratory:  Negative for shortness of breath.   Cardiovascular:  Negative for chest pain, palpitations and leg swelling.  Neurological:  Negative for dizziness and headaches.  Psychiatric/Behavioral:  Negative for depression and suicidal ideas. The patient is not nervous/anxious.    All other ROS negative except what is listed above and in the HPI.      Objective:    BP 133/88 (BP Location: Left Arm, Patient Position: Sitting, Cuff Size: Large)   Pulse 77   Ht 5\' 4"  (1.626 m)   Wt 163 lb 12.8 oz (74.3 kg)   SpO2 99%   BMI 28.12 kg/m   Wt Readings from Last 3 Encounters:  07/26/23 163 lb 12.8 oz (74.3 kg)  06/02/23 159 lb 12.8 oz (72.5 kg)  05/13/23 160 lb (72.6 kg)    Physical Exam Vitals and nursing note reviewed. Exam conducted with a chaperone present Wilhemena Durie, CMA.).  Constitutional:      General: She is awake. She is not in acute distress.    Appearance: Normal appearance. She is well-developed. She is not ill-appearing.  HENT:     Head:  Normocephalic and atraumatic.     Right Ear: Hearing, tympanic membrane, ear canal and external ear normal. No drainage.     Left Ear: Hearing, tympanic membrane, ear canal and external ear normal. No drainage.     Nose: Nose normal.     Right Sinus: No maxillary sinus tenderness or frontal sinus tenderness.     Left Sinus: No maxillary sinus tenderness or frontal sinus tenderness.     Mouth/Throat:     Mouth: Mucous membranes are moist.     Pharynx: Oropharynx is clear. Uvula midline. No pharyngeal swelling, oropharyngeal exudate or posterior oropharyngeal erythema.  Eyes:     General: Lids are normal.        Right eye: No discharge.        Left eye: No discharge.     Extraocular Movements: Extraocular movements intact.     Conjunctiva/sclera: Conjunctivae normal.     Pupils: Pupils are equal, round, and reactive to light.     Visual Fields: Right eye visual fields normal and left eye visual fields normal.  Neck:     Thyroid: No thyromegaly.     Vascular: No carotid bruit.     Trachea: Trachea normal.  Cardiovascular:     Rate and Rhythm: Normal rate and regular rhythm.     Heart sounds: Normal heart sounds. No murmur heard.    No gallop.  Pulmonary:     Effort: Pulmonary effort is normal. No accessory muscle usage or respiratory distress.     Breath sounds: Normal breath sounds.  Chest:  Breasts:    Right: Normal.     Left: Normal.  Abdominal:     General: Bowel sounds are normal.  Palpations: Abdomen is soft. There is no hepatomegaly or splenomegaly.     Tenderness: There is no abdominal tenderness.  Genitourinary:    General: Normal vulva.     Vagina: Normal.     Cervix: Normal.     Adnexa: Right adnexa normal and left adnexa normal.  Musculoskeletal:        General: Normal range of motion.     Cervical back: Normal range of motion and neck supple.     Right lower leg: No edema.     Left lower leg: No edema.  Lymphadenopathy:     Head:     Right side of head: No  submental, submandibular, tonsillar, preauricular or posterior auricular adenopathy.     Left side of head: No submental, submandibular, tonsillar, preauricular or posterior auricular adenopathy.     Cervical: No cervical adenopathy.     Upper Body:     Right upper body: No supraclavicular, axillary or pectoral adenopathy.     Left upper body: No supraclavicular, axillary or pectoral adenopathy.  Skin:    General: Skin is warm and dry.     Capillary Refill: Capillary refill takes less than 2 seconds.     Findings: No rash.  Neurological:     Mental Status: She is alert and oriented to person, place, and time.     Gait: Gait is intact.     Deep Tendon Reflexes: Reflexes are normal and symmetric.     Reflex Scores:      Brachioradialis reflexes are 2+ on the right side and 2+ on the left side.      Patellar reflexes are 2+ on the right side and 2+ on the left side. Psychiatric:        Attention and Perception: Attention normal.        Mood and Affect: Mood normal.        Speech: Speech normal.        Behavior: Behavior normal. Behavior is cooperative.        Thought Content: Thought content normal.        Judgment: Judgment normal.     Results for orders placed or performed in visit on 05/13/23  Cytology - PAP( [place])   Collection Time: 05/13/23  8:58 AM  Result Value Ref Range   High risk HPV Negative    Neisseria Gonorrhea Negative    Chlamydia Negative    Adequacy      Satisfactory for evaluation; transformation zone component PRESENT.   Diagnosis      - Negative for intraepithelial lesion or malignancy (NILM)   Comment Normal Reference Ranger Chlamydia - Negative    Comment      Normal Reference Range Neisseria Gonorrhea - Negative   Comment Normal Reference Range HPV - Negative   HIV antibody (with reflex)   Collection Time: 05/13/23  9:05 AM  Result Value Ref Range   HIV Screen 4th Generation wRfx Non Reactive Non Reactive  Hepatitis C Antibody   Collection  Time: 05/13/23  9:05 AM  Result Value Ref Range   Hep C Virus Ab Non Reactive Non Reactive  RPR   Collection Time: 05/13/23  9:05 AM  Result Value Ref Range   RPR Ser Ql Non Reactive Non Reactive      Assessment & Plan:   Problem List Items Addressed This Visit   None Visit Diagnoses       Annual physical exam    -  Primary   Health maintenance  reviewed during visit today.  Labs ordered.  Vaccines reviewed.  PAP up to date.   Relevant Orders   CBC with Differential/Platelet   Comprehensive metabolic panel   Lipid panel   TSH   Urinalysis, Routine w reflex microscopic     Screening for ischemic heart disease       Relevant Orders   Lipid panel         Follow up plan: Return in about 1 year (around 07/25/2024) for Physical and Fasting labs.   LABORATORY TESTING:  - Pap smear: Up to date  IMMUNIZATIONS:   - Tdap: Tetanus vaccination status reviewed: last tetanus booster within 10 years. - Influenza: Refused - Pneumovax: Not applicable - Prevnar: Not applicable - HPV: Refused - Zostavax vaccine: Not applicable  SCREENING: -Mammogram: Not applicable  - Colonoscopy: Not applicable  - Bone Density: Not applicable  -Hearing Test: Not applicable  -Spirometry: Not applicable   PATIENT COUNSELING:   Advised to take 1 mg of folate supplement per day if capable of pregnancy.   Sexuality: Discussed sexually transmitted diseases, partner selection, use of condoms, avoidance of unintended pregnancy  and contraceptive alternatives.   Advised to avoid cigarette smoking.  I discussed with the patient that most people either abstain from alcohol or drink within safe limits (<=14/week and <=4 drinks/occasion for males, <=7/weeks and <= 3 drinks/occasion for females) and that the risk for alcohol disorders and other health effects rises proportionally with the number of drinks per week and how often a drinker exceeds daily limits.  Discussed cessation/primary prevention of drug  use and availability of treatment for abuse.   Diet: Encouraged to adjust caloric intake to maintain  or achieve ideal body weight, to reduce intake of dietary saturated fat and total fat, to limit sodium intake by avoiding high sodium foods and not adding table salt, and to maintain adequate dietary potassium and calcium preferably from fresh fruits, vegetables, and low-fat dairy products.    stressed the importance of regular exercise  Injury prevention: Discussed safety belts, safety helmets, smoke detector, smoking near bedding or upholstery.   Dental health: Discussed importance of regular tooth brushing, flossing, and dental visits.    NEXT PREVENTATIVE PHYSICAL DUE IN 1 YEAR. Return in about 1 year (around 07/25/2024) for Physical and Fasting labs.

## 2023-07-27 ENCOUNTER — Encounter: Payer: Self-pay | Admitting: Nurse Practitioner

## 2023-07-27 LAB — COMPREHENSIVE METABOLIC PANEL
ALT: 12 [IU]/L (ref 0–32)
AST: 17 [IU]/L (ref 0–40)
Albumin: 4.5 g/dL (ref 3.9–4.9)
Alkaline Phosphatase: 45 [IU]/L (ref 44–121)
BUN/Creatinine Ratio: 18 (ref 9–23)
BUN: 13 mg/dL (ref 6–20)
Bilirubin Total: 0.3 mg/dL (ref 0.0–1.2)
CO2: 21 mmol/L (ref 20–29)
Calcium: 9.5 mg/dL (ref 8.7–10.2)
Chloride: 104 mmol/L (ref 96–106)
Creatinine, Ser: 0.74 mg/dL (ref 0.57–1.00)
Globulin, Total: 1.9 g/dL (ref 1.5–4.5)
Glucose: 82 mg/dL (ref 70–99)
Potassium: 4.7 mmol/L (ref 3.5–5.2)
Sodium: 138 mmol/L (ref 134–144)
Total Protein: 6.4 g/dL (ref 6.0–8.5)
eGFR: 111 mL/min/{1.73_m2} (ref >59)

## 2023-07-27 LAB — LIPID PANEL
Chol/HDL Ratio: 2.9 {ratio} (ref 0.0–4.4)
Cholesterol, Total: 168 mg/dL (ref 100–199)
HDL: 57 mg/dL (ref >39)
LDL Chol Calc (NIH): 96 mg/dL (ref 0–99)
Triglycerides: 80 mg/dL (ref 0–149)
VLDL Cholesterol Cal: 15 mg/dL (ref 5–40)

## 2023-07-27 LAB — CBC WITH DIFFERENTIAL/PLATELET
Basophils Absolute: 0.1 10*3/uL (ref 0.0–0.2)
Basos: 1 %
EOS (ABSOLUTE): 0.2 10*3/uL (ref 0.0–0.4)
Eos: 2 %
Hematocrit: 39 % (ref 34.0–46.6)
Hemoglobin: 13 g/dL (ref 11.1–15.9)
Immature Grans (Abs): 0 10*3/uL (ref 0.0–0.1)
Immature Granulocytes: 0 %
Lymphocytes Absolute: 2.6 10*3/uL (ref 0.7–3.1)
Lymphs: 35 %
MCH: 30.4 pg (ref 26.6–33.0)
MCHC: 33.3 g/dL (ref 31.5–35.7)
MCV: 91 fL (ref 79–97)
Monocytes Absolute: 0.7 10*3/uL (ref 0.1–0.9)
Monocytes: 10 %
Neutrophils Absolute: 3.9 10*3/uL (ref 1.4–7.0)
Neutrophils: 52 %
Platelets: 283 10*3/uL (ref 150–450)
RBC: 4.28 x10E6/uL (ref 3.77–5.28)
RDW: 13.2 % (ref 11.7–15.4)
WBC: 7.5 10*3/uL (ref 3.4–10.8)

## 2023-07-27 LAB — TSH: TSH: 0.975 u[IU]/mL (ref 0.450–4.500)

## 2023-08-03 DIAGNOSIS — F331 Major depressive disorder, recurrent, moderate: Secondary | ICD-10-CM | POA: Diagnosis not present

## 2023-08-06 MED ORDER — ONDANSETRON HCL 4 MG PO TABS: 4.0000 mg | ORAL_TABLET | Freq: Three times a day (TID) | ORAL | 0 refills | Status: DC | PRN

## 2023-08-11 DIAGNOSIS — F331 Major depressive disorder, recurrent, moderate: Secondary | ICD-10-CM | POA: Diagnosis not present

## 2023-08-18 DIAGNOSIS — J029 Acute pharyngitis, unspecified: Secondary | ICD-10-CM | POA: Diagnosis not present

## 2023-08-18 DIAGNOSIS — R0981 Nasal congestion: Secondary | ICD-10-CM | POA: Diagnosis not present

## 2023-08-18 DIAGNOSIS — Z3202 Encounter for pregnancy test, result negative: Secondary | ICD-10-CM | POA: Diagnosis not present

## 2023-08-18 DIAGNOSIS — R112 Nausea with vomiting, unspecified: Secondary | ICD-10-CM | POA: Diagnosis not present

## 2023-08-30 DIAGNOSIS — J069 Acute upper respiratory infection, unspecified: Secondary | ICD-10-CM | POA: Diagnosis not present

## 2023-08-30 DIAGNOSIS — R0982 Postnasal drip: Secondary | ICD-10-CM | POA: Diagnosis not present

## 2023-08-30 DIAGNOSIS — F331 Major depressive disorder, recurrent, moderate: Secondary | ICD-10-CM | POA: Diagnosis not present

## 2023-09-03 DIAGNOSIS — M47816 Spondylosis without myelopathy or radiculopathy, lumbar region: Secondary | ICD-10-CM | POA: Diagnosis not present

## 2023-09-03 DIAGNOSIS — M25561 Pain in right knee: Secondary | ICD-10-CM | POA: Diagnosis not present

## 2023-09-03 DIAGNOSIS — M47899 Other spondylosis, site unspecified: Secondary | ICD-10-CM | POA: Diagnosis not present

## 2023-09-07 ENCOUNTER — Other Ambulatory Visit: Payer: Self-pay | Admitting: Nurse Practitioner

## 2023-09-08 NOTE — Telephone Encounter (Signed)
 Called and spoke to the patient. Patient states she is still using this shampoo and needs a new prescription.

## 2023-09-08 NOTE — Telephone Encounter (Signed)
 Requested medication (s) are due for refill today: Yes  Requested medication (s) are on the active medication list: No  Last refill:  10/20/22  Future visit scheduled: Yes  Notes to clinic:  Shampoo not on current list, discontinued 06/02/23     Requested Prescriptions  Pending Prescriptions Disp Refills   ketoconazole (NIZORAL) 2 % shampoo [Pharmacy Med Name: KETOCONAZOLE 2% SHAMPOO 120ML] 120 mL 5    Sig: APPLY TOPICALLY 2 TIMES A WEEK     Not Delegated - Over the Counter: OTC 2 Failed - 09/08/2023 12:31 PM      Failed - This refill cannot be delegated      Passed - Valid encounter within last 12 months    Recent Outpatient Visits           3 months ago Acute cough   [place] Larae Grooms, NP   9 months ago Acute non-recurrent maxillary sinusitis   [place] [facility] Trimble, Megan P, DO   10 months ago Chronic right-sided low back pain with right-sided sciatica   [place] [facility] Kennard, Corrie Dandy T, NP   11 months ago Muscle soreness   [place] [facility] Larae Grooms, NP   1 year ago Annual physical exam   [place] [facility] Larae Grooms, NP       Future Appointments             In 10 months Larae Grooms, NP [facility]

## 2023-09-10 DIAGNOSIS — F331 Major depressive disorder, recurrent, moderate: Secondary | ICD-10-CM | POA: Diagnosis not present

## 2023-09-21 NOTE — Progress Notes (Unsigned)
 There were no vitals taken for this visit.   Subjective:    [name], female    DOB: Jan 20, 1991, 33 y.o.   MRN: [id number]  HPI: Alyssa Kemp is a 33 y.o. female  No chief complaint on file.   Relevant past medical, surgical, family and social history reviewed and updated as indicated. Interim medical history since our last visit reviewed. Allergies and medications reviewed and updated.  Review of Systems  Per HPI unless specifically indicated above     Objective:    There were no vitals taken for this visit.  Wt Readings from Last 3 Encounters:  07/26/23 163 lb 12.8 oz (74.3 kg)  06/02/23 159 lb 12.8 oz (72.5 kg)  05/13/23 160 lb (72.6 kg)    Physical Exam  Results for orders placed or performed in visit on 07/26/23  Microscopic Examination   Collection Time: 07/26/23  9:50 AM   Urine  Result Value Ref Range   WBC, UA 0-5 0 - 5 /hpf   RBC, Urine 0-2 0 - 2 /hpf   Epithelial Cells (non renal) 0-10 0 - 10 /hpf   Bacteria, UA None seen None seen/Few  Urinalysis, Routine w reflex microscopic   Collection Time: 07/26/23  9:50 AM  Result Value Ref Range   Specific Gravity, UA 1.015 1.005 - 1.030   pH, UA 7.0 5.0 - 7.5   Color, UA Yellow Yellow   Appearance Ur Clear Clear   Leukocytes,UA Negative Negative   Protein,UA Negative Negative/Trace   Glucose, UA Negative Negative   Ketones, UA Negative Negative   RBC, UA Trace (A) Negative   Bilirubin, UA Negative Negative   Urobilinogen, Ur 0.2 0.2 - 1.0 mg/dL   Nitrite, UA Negative Negative   Microscopic Examination See below:   CBC with Differential/Platelet   Collection Time: 07/26/23  9:51 AM  Result Value Ref Range   WBC 7.5 3.4 - 10.8 x10E3/uL   RBC 4.28 3.77 - 5.28 x10E6/uL   Hemoglobin 13.0 11.1 - 15.9 g/dL   Hematocrit 95.2 84.1 - 46.6 %   MCV 91 79 - 97 fL   MCH 30.4 26.6 - 33.0 pg   MCHC 33.3 31.5 - 35.7 g/dL   RDW 32.4 40.1 - 02.7 %   Platelets 283 150 - 450  x10E3/uL   Neutrophils 52 Not Estab. %   Lymphs 35 Not Estab. %   Monocytes 10 Not Estab. %   Eos 2 Not Estab. %   Basos 1 Not Estab. %   Neutrophils Absolute 3.9 1.4 - 7.0 x10E3/uL   Lymphocytes Absolute 2.6 0.7 - 3.1 x10E3/uL   Monocytes Absolute 0.7 0.1 - 0.9 x10E3/uL   EOS (ABSOLUTE) 0.2 0.0 - 0.4 x10E3/uL   Basophils Absolute 0.1 0.0 - 0.2 x10E3/uL   Immature Granulocytes 0 Not Estab. %   Immature Grans (Abs) 0.0 0.0 - 0.1 x10E3/uL  Comprehensive metabolic panel   Collection Time: 07/26/23  9:51 AM  Result Value Ref Range   Glucose 82 70 - 99 mg/dL   BUN 13 6 - 20 mg/dL   Creatinine, Ser 2.53 0.57 - 1.00 mg/dL   eGFR 664 >40 HK/VQQ/5.95   BUN/Creatinine Ratio 18 9 - 23   Sodium 138 134 - 144 mmol/L   Potassium 4.7 3.5 - 5.2 mmol/L   Chloride 104 96 - 106 mmol/L   CO2 21 20 - 29 mmol/L   Calcium 9.5 8.7 - 10.2 mg/dL   Total Protein 6.4 6.0 -  8.5 g/dL   Albumin 4.5 3.9 - 4.9 g/dL   Globulin, Total 1.9 1.5 - 4.5 g/dL   Bilirubin Total 0.3 0.0 - 1.2 mg/dL   Alkaline Phosphatase 45 44 - 121 IU/L   AST 17 0 - 40 IU/L   ALT 12 0 - 32 IU/L  Lipid panel   Collection Time: 07/26/23  9:51 AM  Result Value Ref Range   Cholesterol, Total 168 100 - 199 mg/dL   Triglycerides 80 0 - 149 mg/dL   HDL 57 >16 mg/dL   VLDL Cholesterol Cal 15 5 - 40 mg/dL   LDL Chol Calc (NIH) 96 0 - 99 mg/dL   Chol/HDL Ratio 2.9 0.0 - 4.4 ratio  TSH   Collection Time: 07/26/23  9:51 AM  Result Value Ref Range   TSH 0.975 0.450 - 4.500 uIU/mL      Assessment & Plan:   Problem List Items Addressed This Visit   None    Follow up plan: No follow-ups on file.

## 2023-09-22 ENCOUNTER — Encounter: Payer: Self-pay | Admitting: Nurse Practitioner

## 2023-09-22 ENCOUNTER — Ambulatory Visit: Admitting: Nurse Practitioner

## 2023-09-22 VITALS — BP 106/65 | HR 69 | Temp 98.6°F | Resp 17 | Ht 64.02 in | Wt 162.6 lb

## 2023-09-22 DIAGNOSIS — L84 Corns and callosities: Secondary | ICD-10-CM

## 2023-09-22 DIAGNOSIS — D229 Melanocytic nevi, unspecified: Secondary | ICD-10-CM

## 2023-09-23 DIAGNOSIS — F4310 Post-traumatic stress disorder, unspecified: Secondary | ICD-10-CM | POA: Diagnosis not present

## 2023-09-23 DIAGNOSIS — F331 Major depressive disorder, recurrent, moderate: Secondary | ICD-10-CM | POA: Diagnosis not present

## 2023-10-18 DIAGNOSIS — F331 Major depressive disorder, recurrent, moderate: Secondary | ICD-10-CM | POA: Diagnosis not present

## 2023-11-03 ENCOUNTER — Encounter: Payer: Self-pay | Admitting: Podiatry

## 2023-11-03 ENCOUNTER — Ambulatory Visit: Admitting: Podiatry

## 2023-11-03 VITALS — Ht 64.0 in | Wt 162.0 lb

## 2023-11-03 DIAGNOSIS — D2372 Other benign neoplasm of skin of left lower limb, including hip: Secondary | ICD-10-CM

## 2023-11-03 DIAGNOSIS — D492 Neoplasm of unspecified behavior of bone, soft tissue, and skin: Secondary | ICD-10-CM

## 2023-11-03 NOTE — Progress Notes (Signed)
  Subjective:  [name], female    DOB: 1990-08-13,  MRN: [id number]  Chief Complaint  Patient presents with   Callouses    Patient is here for small callous on underside of toe, and sides of lateral foot    33 y.o. female presents with the above complaint. History confirmed with patient.  This is been present for several years comes and goes and becomes painful  Objective:  Physical Exam: warm, good capillary refill, no trophic changes or ulcerative lesions, normal DP and PT pulses, normal sensory exam, and painful benign-appearing pinpoint skin lesion submetatarsal 4.  Assessment:   1. Benign neoplasm of skin of left foot      Plan:  Patient was evaluated and treated and all questions answered.  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  Debrided the lesion sharply to enucleate the central core and applied salicylic acid treatment for destruction of the lesion.  Post care instructions given I recommend she use salicylic acid 40% at home on this as well.  Follow-up as needed.  No follow-ups on file.

## 2023-11-03 NOTE — Patient Instructions (Signed)
 Look for salicylic 40% cream or ointment and apply to the thickened dry skin / calluses. This can be bought over the counter, at a pharmacy or online such as [employer]. It may be listed as maximum strength corn or wart remover

## 2023-11-04 DIAGNOSIS — F331 Major depressive disorder, recurrent, moderate: Secondary | ICD-10-CM | POA: Diagnosis not present

## 2023-11-09 ENCOUNTER — Other Ambulatory Visit: Payer: Self-pay | Admitting: Nurse Practitioner

## 2023-11-11 NOTE — Telephone Encounter (Signed)
 Requested medication (s) are due for refill today: yes  Requested medication (s) are on the active medication list: yes  Last refill:  07/26/23  Future visit scheduled: yes  Notes to clinic:  Medication not assigned to a protocol, review manually.      Requested Prescriptions  Pending Prescriptions Disp Refills   fluocinonide  (LIDEX ) 0.05 % external solution [Pharmacy Med Name: FLUOCINONIDE  0.05% SOLN 20ML] 60 mL 1    Sig: APPLY 1 APPLICATION DAILY FOR 14 DAYS. STOP USING DAILY IN 14 DAYS AND ONLY USE AS NEEDED(NO MORE THAN TWICE A WEEK     Off-Protocol Failed - 11/11/2023  9:41 AM      Failed - Medication not assigned to a protocol, review manually.      Passed - Valid encounter within last 12 months    Recent Outpatient Visits           1 month ago Callus   [place] [facility] Aileen Alexanders, NP   3 months ago Annual physical exam   [place] [facility] Aileen Alexanders, NP       Future Appointments             In 8 months Aileen Alexanders, NP [place] [facility]

## 2023-11-11 NOTE — Telephone Encounter (Signed)
 Requested Prescriptions  Refused Prescriptions Disp Refills   omeprazole  (PRILOSEC) 20 MG capsule [Pharmacy Med Name: OMEPRAZOLE  20MG  CAPSULES] 90 capsule 1    Sig: TAKE 1 CAPSULE(20 MG) BY MOUTH DAILY     Gastroenterology: Proton Pump Inhibitors Passed - 11/11/2023  9:36 AM      Passed - Valid encounter within last 12 months    Recent Outpatient Visits           1 month ago Callus   [place] [facility] Aileen Alexanders, NP   3 months ago Annual physical exam   [place] [facility] Aileen Alexanders, NP       Future Appointments             In 8 months Aileen Alexanders, NP [place] [facility]

## 2023-12-16 ENCOUNTER — Ambulatory Visit
Admission: EM | Admit: 2023-12-16 | Discharge: 2023-12-16 | Disposition: A | Discharge status: Home / Self Care | Attending: Emergency Medicine | Admitting: Emergency Medicine

## 2023-12-16 ENCOUNTER — Encounter: Payer: Self-pay | Admitting: Emergency Medicine

## 2023-12-16 ENCOUNTER — Ambulatory Visit: Payer: Self-pay

## 2023-12-16 DIAGNOSIS — S0592XA Unspecified injury of left eye and orbit, initial encounter: Secondary | ICD-10-CM | POA: Diagnosis not present

## 2023-12-16 MED ORDER — ERYTHROMYCIN 5 MG/GM OP OINT: TOPICAL_OINTMENT | OPHTHALMIC | 0 refills | Status: DC

## 2023-12-16 MED ORDER — PREDNISONE 20 MG PO TABS: 40.0000 mg | ORAL_TABLET | Freq: Every day | ORAL | 0 refills | Status: DC

## 2023-12-16 NOTE — Discharge Instructions (Signed)
 Your evaluated for your left eye injury  Due to open wound at the corner of the eye use erythromycin ointment every 8 hours for 7 days to prevent eye from becoming infected  Cleanse over the area with unscented soap and water, pat and do not rub  Begin prednisone  every morning with food for 5 days to help reduce swelling and help with pain, may take Tylenol  additionally  May apply cool to warm compresses over the affected area

## 2023-12-16 NOTE — Telephone Encounter (Signed)
 FYI Only or Action Required?: FYI only for provider.  Patient was last seen in primary care on 09/22/2023 by Melvin Pao, NP. Called Nurse Triage reporting Eye Injury. Symptoms began yesterday. Interventions attempted: OTC medications: Tylenol  and Ice/heat application. Symptoms are: stable.  Triage Disposition: See Physician Within 4 Hours (or PCP Triage) (overriding See Physician Within 24 Hours)  Patient/caregiver understands and will follow disposition?: Yes                             Copied from CRM [id number]. Topic: Clinical - Red Word Triage >> Dec 16, 2023  8:26 AM Dawna HERO wrote: Red Word that prompted transfer to Nurse Triage: patient states she cut open under her left eye yesterday and her eye is now swollen and says it is very painful and uncomfortable Reason for Disposition  Large swelling or bruise (>2 inches or 5 cm)  Answer Assessment - Initial Assessment Questions 1. MECHANISM: How did the injury happen?      States she was in a rush and opened a car door into her eye, states car door did not hit eye directly- hit bottom/side near lash line 2. ONSET: When did the injury happen? (Minutes or hours ago)      Yesterday  3. LOCATION: What part of the eye is injured? (cornea, sclera, eyelid, or periorbital tissue)     Left eye  4. APPEARANCE: What does the eye look like?      Swollen, indention/cut to side of left eye, eye itself is red 5. VISION: Is the vision blurred?      States she can see out of this eye, slightly blurry when she moves eye a certain way and feels weird 6. PAIN: Is it painful? If Yes, ask: How bad is the pain?   (e.g., Scale 1-10; or mild, moderate, severe)     Rates pain an 8, states Tylenol  is not helping 7. SIZE: For cuts, bruises, or swelling, ask: How large is it? (e.g., inches or centimeters)      Looks like its starting to bruise 9. CONTACTS: Do you wear contacts?     Denies 10. OTHER SYMPTOMS: Do  you have any other symptoms? (e.g., headache, neck pain, vomiting)     Denies headache, denies vomiting, denies neck pain    No availability in office today, patient going to UC.  Protocols used: Eye Injury-A-AH

## 2023-12-16 NOTE — ED Provider Notes (Signed)
 [name]    CSN: [id number] Arrival date & time: 12/16/23  9083      History   Chief Complaint Chief Complaint  Patient presents with   Eye Injury    HPI Alyssa Kemp is a 33 y.o. female.   Patient presents for evaluation for left eye pain, bruising, swelling and sensation of a foreign body beginning 1 day ago after eye injury.  Patient hit the corner of the left eye and the eyelid with the corner of the door while opening it.  Endorses she experienced immediate pain and vision loss for a few seconds before returning to baseline.  Has had persistent symptoms since.  Denies eye drainage or redness, light sensitivity, blurriness or seeing spots.  Has applied ice.  Past Medical History:  Diagnosis Date   Anxiety    Arthritis    Bipolar depression (HCC)    Depression    GERD (gastroesophageal reflux disease)    Gestational diabetes    LGSIL on Pap smear of cervix 12/16/2015   Neuromuscular disorder [facility])    Scoliosis     Patient Active Problem List   Diagnosis Date Noted   Seborrheic dermatitis 10/20/2022   Muscle soreness 10/07/2022   Labia irritation 10/10/2021   Menstrual abnormality 08/27/2021   Allergic rhinitis 09/08/2018   Bipolar 1 disorder (HCC) 09/05/2016   GERD (gastroesophageal reflux disease) 08/28/2016   Anxiety, generalized 11/05/2015   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/05/2015    Past Surgical History:  Procedure Laterality Date   ADENOIDECTOMY     COLPOSCOPY  01/15/2016   CIN 1   CYSTOSCOPY W/ RETROGRADES Bilateral 12/28/2017   Procedure: CYSTOSCOPY WITH RETROGRADE PYELOGRAM;  Surgeon: Twylla Glendia BROCKS, MD;  Location: [facility] ORS;  Service: Urology;  Laterality: Bilateral;   SPINE SURGERY     TONSILLECTOMY     TUBAL LIGATION Bilateral 09/19/2016   Procedure: POST PARTUM TUBAL LIGATION;  Surgeon: Garnette JONETTA Mace, MD;  Location: [facility] ORS;  Service: Gynecology;  Laterality: Bilateral;   WISDOM TOOTH EXTRACTION       OB History     Gravida  3   Para  3   Term  3   Preterm      AB      Living  3      SAB      IAB      Ectopic      Multiple      Live Births  3            Home Medications    Prior to Admission medications   Medication Sig Start Date End Date Taking? Authorizing Provider  erythromycin ophthalmic ointment Place a 1/2 inch ribbon of ointment into the lower eyelid every 8 hours for 7 days 12/16/23  Yes Tangee Marszalek R, NP  predniSONE  (DELTASONE ) 20 MG tablet Take 2 tablets (40 mg total) by mouth daily. 12/16/23  Yes Kiannah Grunow R, NP  celecoxib (CELEBREX) 100 MG capsule Take 100 mg by mouth 2 (two) times daily. 05/07/23   [provider]  cetirizine  (ZYRTEC ) 10 MG tablet TAKE 1 TABLET(10 MG) BY MOUTH DAILY 12/09/22   Melvin Pao, NP  EPINEPHrine  (EPIPEN  2-PAK) 0.3 mg/0.3 mL IJ SOAJ injection Inject 0.3 mLs (0.3 mg total) into the muscle as needed for anaphylaxis. 12/15/19   Stuart Vernell Norris, PA-C  escitalopram (LEXAPRO) 10 MG tablet Take 10 mg by mouth daily. 07/02/23   [provider]  fluocinonide  (LIDEX )  0.05 % external solution APPLY 1 APPLICATION DAILY FOR 14 DAYS. STOP USING DAILY IN 14 DAYS AND ONLY USE AS NEEDED(NO MORE THAN TWICE A WEEK 11/11/23   Melvin Pao, NP  gabapentin (NEURONTIN) 300 MG capsule Take 300 mg by mouth 3 (three) times daily.    [provider]  ketoconazole  (NIZORAL ) 2 % shampoo APPLY TOPICALLY 2 TIMES A WEEK 09/09/23   Melvin Pao, NP  methocarbamol  (ROBAXIN ) 500 MG tablet Take 500 mg by mouth 3 (three) times daily.    [provider]  Multiple Vitamin (MULTIVITAMINS PO) Take by mouth daily.    [provider]  omeprazole  (PRILOSEC) 20 MG capsule Take 1 capsule (20 mg total) by mouth daily. 07/26/23   Melvin Pao, NP    Family History Family History  Problem Relation Age of Onset   Cervical cancer Mother    ADD / ADHD Mother    Anxiety disorder Mother     Heart disease Father    Pancreatic cancer Maternal Aunt        dx late 88s   Hodgkin's lymphoma Paternal Aunt        dx 30s   Hypertension Maternal Grandmother    Pancreatic cancer Maternal Grandmother 78   Diabetes Maternal Grandmother    Arthritis Maternal Grandmother    Cancer Maternal Grandmother    Coronary artery disease Maternal Grandfather    Congestive Heart Failure Maternal Grandfather    Hypertension Maternal Grandfather    Prostate cancer Maternal Grandfather        dx 80s   Diabetes Maternal Grandfather    Depression Maternal Grandfather    Hypertension Paternal Grandmother    Diabetes Paternal Grandmother    Heart disease Paternal Grandfather    Hypertension Paternal Grandfather    Lung cancer Paternal Great-grandfather    Stomach cancer Paternal Great-grandmother    Stomach cancer Maternal Great-grandfather     Social History Social History   Tobacco Use   Smoking status: Never   Smokeless tobacco: Never  Vaping Use   Vaping status: Never Used  Substance Use Topics   Alcohol use: No    Comment: socially   Drug use: No     Allergies   Sulfacetamide sodium, Sulfasalazine, Doxycycline, Vyvanse [lisdexamfetamine], Ciprofloxacin , Macrobid  [nitrofurantoin ], and Amoxicillin-pot clavulanate   Review of Systems Review of Systems   Physical Exam Triage Vital Signs ED Triage Vitals  Encounter Vitals Group     BP 12/16/23 0923 116/76     Girls Systolic BP Percentile --      Girls Diastolic BP Percentile --      Boys Systolic BP Percentile --      Boys Diastolic BP Percentile --      Pulse Rate 12/16/23 0923 63     Resp 12/16/23 0923 18     Temp 12/16/23 0923 97.8 F (36.6 C)     Temp Source 12/16/23 0923 Oral     SpO2 12/16/23 0923 98 %     Weight --      Height --      Head Circumference --      Peak Flow --      Pain Score 12/16/23 0925 7     Pain Loc --      Pain Education --      Exclude from Growth Chart --    No data found.  Updated  Vital Signs BP 116/76 (BP Location: Left Arm)   Pulse 63   Temp 97.8 F (36.6  C) (Oral)   Resp 18   LMP 12/11/2023 (Exact Date)   SpO2 98%   Visual Acuity Right Eye Distance: 20/50 (wears eye glassess. Did not have them with her.) Left Eye Distance: 20/25 (wears eye glassess. Did not have them with her.) Bilateral Distance: 20/20 (wears eye glassess. Did not have them with her.)  Right Eye Near:   Left Eye Near:    Bilateral Near:     Physical Exam HENT:     Head:     Comments: 1 cm abrasion present to the medial aspect of the left periorbital, nondraining, tender and swollen, surrounding ecchymosis to the medial and lower periorbital with mild swelling     UC Treatments / Results  Labs (all labs ordered are listed, but only abnormal results are displayed) Labs Reviewed - No data to display  EKG   Radiology No results found.  Procedures Procedures (including critical care time)  Medications Ordered in UC Medications - No data to display  Initial Impression / Assessment and Plan / UC Course  I have reviewed the triage vital signs and the nursing notes.  Pertinent labs & imaging results that were available during my care of the patient were reviewed by me and considered in my medical decision making (see chart for details).  Left eye injury  Abrasion and ecchymosis present to the periorbital, no overt abnormality to the eye, decrease in vision to the left side during visual acuity, wears glasses but not present on exam, attempted to get patient appointment with ophthalmologist but due to financial reasons unable to be seen, prescribed erythromycin ointment and oral prednisone  advised daily cleansing to the abrasion and monitoring  At the completion of visit, patient was able to get an appointment with an alternate ophthalmologist, will be seen today, advised to hold off on medication use until after evaluated by specialist, verbalized understanding Final Clinical  Impressions(s) / UC Diagnoses   Final diagnoses:  Left eye injury, initial encounter     Discharge Instructions      Your evaluated for your left eye injury  Due to open wound at the corner of the eye use erythromycin ointment every 8 hours for 7 days to prevent eye from becoming infected  Cleanse over the area with unscented soap and water, pat and do not rub  Begin prednisone  every morning with food for 5 days to help reduce swelling and help with pain, may take Tylenol  additionally  May apply cool to warm compresses over the affected area   ED Prescriptions     Medication Sig Dispense Auth. Provider   predniSONE  (DELTASONE ) 20 MG tablet Take 2 tablets (40 mg total) by mouth daily. 10 tablet Lancer Thurner R, NP   erythromycin ophthalmic ointment Place a 1/2 inch ribbon of ointment into the lower eyelid every 8 hours for 7 days 3.5 g Teresa Shelba SAUNDERS, NP      PDMP not reviewed this encounter.   Teresa Shelba SAUNDERS, NP 12/16/23 1029

## 2023-12-16 NOTE — ED Triage Notes (Signed)
 Patient reports that she hit her left eye on car door yesterday. Patient complains of redness and pain. Rates pain 7/10.

## 2023-12-31 DIAGNOSIS — F4310 Post-traumatic stress disorder, unspecified: Secondary | ICD-10-CM | POA: Diagnosis not present

## 2023-12-31 DIAGNOSIS — F331 Major depressive disorder, recurrent, moderate: Secondary | ICD-10-CM | POA: Diagnosis not present

## 2024-01-12 DIAGNOSIS — K029 Dental caries, unspecified: Secondary | ICD-10-CM | POA: Diagnosis not present

## 2024-02-28 ENCOUNTER — Ambulatory Visit: Admitting: Nurse Practitioner

## 2024-03-10 ENCOUNTER — Encounter: Payer: Self-pay | Admitting: Nurse Practitioner

## 2024-03-10 ENCOUNTER — Ambulatory Visit: Admitting: Nurse Practitioner

## 2024-03-10 VITALS — BP 102/68 | HR 53 | Temp 97.8°F | Ht 64.0 in | Wt 159.8 lb

## 2024-03-10 DIAGNOSIS — E162 Hypoglycemia, unspecified: Secondary | ICD-10-CM

## 2024-03-10 NOTE — Progress Notes (Signed)
 BP 102/68   Pulse (!) 53   Temp 97.8 F (36.6 C) (Oral)   Ht 5' 4 (1.626 m)   Wt 159 lb 12.8 oz (72.5 kg)   SpO2 98%   BMI 27.43 kg/m    Subjective:    [name], female    DOB: 10/08/1990, 33 y.o.   MRN: [id number]  HPI: Alyssa Kemp is a 33 y.o. female  Chief Complaint  Patient presents with   Hyperglycemia    Patient states she has noticed she has been eating/wanting more sweets in the last 6 months to a year. States she would like to be tested for diabetes. States she gets nauseated and dizziness on occasion as well, mainly in the mornings. Feels like her blood sugar is off when she experiences this.    Patient states about 6 months to 1 year ago she is craving more sugar and this is unusually for her.  She has less energy.  When she goes too long without eating she gets dizzy, lightheaded, and sick on her stomach and feels like she is going to throw up.  This is happening more often now and feels like it is happening more in the morning. She hasn't checked her blood sugars.  She has been trying to lose some weight.      Relevant past medical, surgical, family and social history reviewed and updated as indicated. Interim medical history since our last visit reviewed. Allergies and medications reviewed and updated.  Review of Systems  Gastrointestinal:  Positive for nausea.  Neurological:  Positive for dizziness and light-headedness.    Per HPI unless specifically indicated above     Objective:    BP 102/68   Pulse (!) 53   Temp 97.8 F (36.6 C) (Oral)   Ht 5' 4 (1.626 m)   Wt 159 lb 12.8 oz (72.5 kg)   SpO2 98%   BMI 27.43 kg/m   Wt Readings from Last 3 Encounters:  03/10/24 159 lb 12.8 oz (72.5 kg)  11/03/23 162 lb (73.5 kg)  09/22/23 162 lb 9.6 oz (73.8 kg)    Physical Exam Vitals and nursing note reviewed.  Constitutional:      General: She is not in acute distress.    Appearance: Normal appearance. She is normal  weight. She is not ill-appearing, toxic-appearing or diaphoretic.  HENT:     Head: Normocephalic.     Right Ear: External ear normal.     Left Ear: External ear normal.     Nose: Nose normal.     Mouth/Throat:     Mouth: Mucous membranes are moist.     Pharynx: Oropharynx is clear.  Eyes:     General:        Right eye: No discharge.        Left eye: No discharge.     Extraocular Movements: Extraocular movements intact.     Conjunctiva/sclera: Conjunctivae normal.     Pupils: Pupils are equal, round, and reactive to light.  Cardiovascular:     Rate and Rhythm: Normal rate and regular rhythm.     Heart sounds: No murmur heard. Pulmonary:     Effort: Pulmonary effort is normal. No respiratory distress.     Breath sounds: Normal breath sounds. No wheezing or rales.  Musculoskeletal:     Cervical back: Normal range of motion and neck supple.  Skin:    General: Skin is warm and dry.     Capillary Refill:  Capillary refill takes less than 2 seconds.  Neurological:     General: No focal deficit present.     Mental Status: She is alert and oriented to person, place, and time. Mental status is at baseline.  Psychiatric:        Mood and Affect: Mood normal.        Behavior: Behavior normal.        Thought Content: Thought content normal.        Judgment: Judgment normal.     Results for orders placed or performed in visit on 07/26/23  Microscopic Examination   Collection Time: 07/26/23  9:50 AM   Urine  Result Value Ref Range   WBC, UA 0-5 0 - 5 /hpf   RBC, Urine 0-2 0 - 2 /hpf   Epithelial Cells (non renal) 0-10 0 - 10 /hpf   Bacteria, UA None seen None seen/Few  Urinalysis, Routine w reflex microscopic   Collection Time: 07/26/23  9:50 AM  Result Value Ref Range   Specific Gravity, UA 1.015 1.005 - 1.030   pH, UA 7.0 5.0 - 7.5   Color, UA Yellow Yellow   Appearance Ur Clear Clear   Leukocytes,UA Negative Negative   Protein,UA Negative Negative/Trace   Glucose, UA  Negative Negative   Ketones, UA Negative Negative   RBC, UA Trace (A) Negative   Bilirubin, UA Negative Negative   Urobilinogen, Ur 0.2 0.2 - 1.0 mg/dL   Nitrite, UA Negative Negative   Microscopic Examination See below:   CBC with Differential/Platelet   Collection Time: 07/26/23  9:51 AM  Result Value Ref Range   WBC 7.5 3.4 - 10.8 x10E3/uL   RBC 4.28 3.77 - 5.28 x10E6/uL   Hemoglobin 13.0 11.1 - 15.9 g/dL   Hematocrit 60.9 65.9 - 46.6 %   MCV 91 79 - 97 fL   MCH 30.4 26.6 - 33.0 pg   MCHC 33.3 31.5 - 35.7 g/dL   RDW 86.7 88.2 - 84.5 %   Platelets 283 150 - 450 x10E3/uL   Neutrophils 52 Not Estab. %   Lymphs 35 Not Estab. %   Monocytes 10 Not Estab. %   Eos 2 Not Estab. %   Basos 1 Not Estab. %   Neutrophils Absolute 3.9 1.4 - 7.0 x10E3/uL   Lymphocytes Absolute 2.6 0.7 - 3.1 x10E3/uL   Monocytes Absolute 0.7 0.1 - 0.9 x10E3/uL   EOS (ABSOLUTE) 0.2 0.0 - 0.4 x10E3/uL   Basophils Absolute 0.1 0.0 - 0.2 x10E3/uL   Immature Granulocytes 0 Not Estab. %   Immature Grans (Abs) 0.0 0.0 - 0.1 x10E3/uL  Comprehensive metabolic panel   Collection Time: 07/26/23  9:51 AM  Result Value Ref Range   Glucose 82 70 - 99 mg/dL   BUN 13 6 - 20 mg/dL   Creatinine, Ser 9.25 0.57 - 1.00 mg/dL   eGFR 888 >40 fO/fpw/8.26   BUN/Creatinine Ratio 18 9 - 23   Sodium 138 134 - 144 mmol/L   Potassium 4.7 3.5 - 5.2 mmol/L   Chloride 104 96 - 106 mmol/L   CO2 21 20 - 29 mmol/L   Calcium 9.5 8.7 - 10.2 mg/dL   Total Protein 6.4 6.0 - 8.5 g/dL   Albumin 4.5 3.9 - 4.9 g/dL   Globulin, Total 1.9 1.5 - 4.5 g/dL   Bilirubin Total 0.3 0.0 - 1.2 mg/dL   Alkaline Phosphatase 45 44 - 121 IU/L   AST 17 0 - 40 IU/L   ALT 12  0 - 32 IU/L  Lipid panel   Collection Time: 07/26/23  9:51 AM  Result Value Ref Range   Cholesterol, Total 168 100 - 199 mg/dL   Triglycerides 80 0 - 149 mg/dL   HDL 57 >60 mg/dL   VLDL Cholesterol Cal 15 5 - 40 mg/dL   LDL Chol Calc (NIH) 96 0 - 99 mg/dL   Chol/HDL Ratio 2.9  0.0 - 4.4 ratio  TSH   Collection Time: 07/26/23  9:51 AM  Result Value Ref Range   TSH 0.975 0.450 - 4.500 uIU/mL      Assessment & Plan:   Problem List Items Addressed This Visit   None Visit Diagnoses       Hypoglycemia    -  Primary   Will rule out Type 2 diabetes.  Suspect patient needs to eat more often. Encouraged 4-6 smaller meals thorughout the day.  Follow up if not improved.   Relevant Orders   Comp Met (CMET)   HgB A1c        Follow up plan: No follow-ups on file.

## 2024-03-11 LAB — COMPREHENSIVE METABOLIC PANEL WITH GFR
ALT: 13 IU/L (ref 0–32)
AST: 16 IU/L (ref 0–40)
Albumin: 4.5 g/dL (ref 3.9–4.9)
Alkaline Phosphatase: 48 IU/L (ref 41–116)
BUN/Creatinine Ratio: 12 (ref 9–23)
BUN: 9 mg/dL (ref 6–20)
Bilirubin Total: 0.5 mg/dL (ref 0.0–1.2)
CO2: 21 mmol/L (ref 20–29)
Calcium: 9.6 mg/dL (ref 8.7–10.2)
Chloride: 103 mmol/L (ref 96–106)
Creatinine, Ser: 0.73 mg/dL (ref 0.57–1.00)
Globulin, Total: 2.1 g/dL (ref 1.5–4.5)
Glucose: 89 mg/dL (ref 70–99)
Potassium: 4.9 mmol/L (ref 3.5–5.2)
Sodium: 139 mmol/L (ref 134–144)
Total Protein: 6.6 g/dL (ref 6.0–8.5)
eGFR: 113 mL/min/1.73 (ref >59)

## 2024-03-11 LAB — HEMOGLOBIN A1C
Est. average glucose Bld gHb Est-mCnc: 82 mg/dL
Hgb A1c MFr Bld: 4.5 % — ABNORMAL LOW (ref 4.8–5.6)

## 2024-03-13 ENCOUNTER — Ambulatory Visit: Payer: Self-pay | Admitting: Nurse Practitioner

## 2024-03-15 DIAGNOSIS — K029 Dental caries, unspecified: Secondary | ICD-10-CM | POA: Diagnosis not present

## 2024-04-25 ENCOUNTER — Ambulatory Visit: Payer: Self-pay

## 2024-04-25 NOTE — Telephone Encounter (Signed)
 FYI Only or Action Required?: FYI only for provider: Patient is recommended to urgent care for evaluation.  Patient was last seen in primary care on 03/10/2024 by Melvin Pao, NP.  Called Nurse Triage reporting Cough.  Symptoms began a week ago.  Interventions attempted: OTC medications: Delsym and Rest, hydration, or home remedies.  Symptoms are: unchanged.  Triage Disposition: See Physician Within 24 Hours  Patient/caregiver understands and will follow disposition?: Yes  Copied from CRM [id number]. Topic: Clinical - Red Word Triage >> Apr 25, 2024  4:36 PM Victoria B wrote: Reason for CRM: has green mucus also turns gray , chest gets tight when she coughs, and vomiting from gag reflex Reason for Disposition  SEVERE coughing spells (e.g., whooping sound after coughing, vomiting after coughing)  Answer Assessment - Initial Assessment Questions Patient reports productive cough, chest tightness and vomiting from gag reflex. Patient states symptoms started last Monday.   1. ONSET: When did the cough begin?      Started last Monday 2. SEVERITY: How bad is the cough today?      Mild-Moderate 3. SPUTUM: Describe the color of your sputum (e.g., none, dry cough; clear, white, yellow, green)     Green mucous that will turn grey 4. HEMOPTYSIS: Are you coughing up any blood? If Yes, ask: How much? (e.g., flecks, streaks, tablespoons, etc.)     no 5. DIFFICULTY BREATHING: Are you having difficulty breathing? If Yes, ask: How bad is it? (e.g., mild, moderate, severe)      No shortness of breath currently 6. FEVER: Do you have a fever? If Yes, ask: What is your temperature, how was it measured, and when did it start?     no 7. CARDIAC HISTORY: Do you have any history of heart disease? (e.g., heart attack, congestive heart failure)      no 8. LUNG HISTORY: Do you have any history of lung disease?  (e.g., pulmonary embolus, asthma, emphysema)     no 9. PE RISK  FACTORS: Do you have a history of blood clots? (or: recent major surgery, recent prolonged travel, bedridden)     no 10. OTHER SYMPTOMS: Do you have any other symptoms? (e.g., runny nose, wheezing, chest pain)       Chest tightness, runny nose 11. PREGNANCY: Is there any chance you are pregnant? When was your last menstrual period?       no 12. TRAVEL: Have you traveled out of the country in the last month? (e.g., travel history, exposures)       no  Protocols used: Cough - Acute Productive-A-AH

## 2024-04-26 DIAGNOSIS — J209 Acute bronchitis, unspecified: Secondary | ICD-10-CM | POA: Diagnosis not present

## 2024-04-26 DIAGNOSIS — J029 Acute pharyngitis, unspecified: Secondary | ICD-10-CM | POA: Diagnosis not present

## 2024-04-26 DIAGNOSIS — J019 Acute sinusitis, unspecified: Secondary | ICD-10-CM | POA: Diagnosis not present

## 2024-04-26 DIAGNOSIS — B9689 Other specified bacterial agents as the cause of diseases classified elsewhere: Secondary | ICD-10-CM | POA: Diagnosis not present

## 2024-05-08 ENCOUNTER — Ambulatory Visit: Admitting: Nurse Practitioner

## 2024-05-08 ENCOUNTER — Ambulatory Visit: Payer: Self-pay | Admitting: Nurse Practitioner

## 2024-05-08 ENCOUNTER — Telehealth: Payer: Self-pay | Admitting: Nurse Practitioner

## 2024-05-08 ENCOUNTER — Encounter: Payer: Self-pay | Admitting: Nurse Practitioner

## 2024-05-08 VITALS — BP 115/82 | HR 82 | Temp 97.6°F | Wt 165.2 lb

## 2024-05-08 DIAGNOSIS — R051 Acute cough: Secondary | ICD-10-CM

## 2024-05-08 LAB — POC COVID19/FLU A&B COMBO
Covid Antigen, POC: NEGATIVE
Influenza A Antigen, POC: NEGATIVE
Influenza B Antigen, POC: NEGATIVE

## 2024-05-08 MED ORDER — METHYLPREDNISOLONE 4 MG PO TBPK: ORAL_TABLET | ORAL | 0 refills | Status: AC

## 2024-05-08 MED ORDER — HYDROCOD POLI-CHLORPHE POLI ER 10-8 MG/5ML PO SUER: 5.0000 mL | Freq: Every evening | ORAL | 0 refills | Status: AC | PRN

## 2024-05-08 MED ORDER — CEPHALEXIN 500 MG PO CAPS: 500.0000 mg | ORAL_CAPSULE | Freq: Three times a day (TID) | ORAL | 0 refills | Status: AC

## 2024-05-08 NOTE — Telephone Encounter (Signed)
 Copied from CRM [id number]. Topic: Clinical - Medication Question >> May 08, 2024 10:33 AM Alfonso HERO wrote: Reason for CRM: patient went to pick up prescription but the Prednisone  and the antibiotics weren't rcvd per the pharmacy.

## 2024-05-08 NOTE — Progress Notes (Signed)
 BP 115/82 (BP Location: Right Arm, Patient Position: Sitting, Cuff Size: Normal)   Pulse 82   Temp 97.6 F (36.4 C) (Oral)   Wt 165 lb 3.2 oz (74.9 kg)   LMP 04/21/2024 (Approximate)   SpO2 99%   BMI 28.36 kg/m    Subjective:    [name], female    DOB: Nov 03, 1990, 33 y.o.   MRN: [id number]  HPI: Alyssa Kemp is a 33 y.o. female  Chief Complaint  Patient presents with   Cough    Patient states it's been going on for 3-4 weeks. She went to urgent care and they gave her a Z-pack and promethazine (cough syrup), but it did not work. She is having coughing spasms randomly causing bladder control issues. She is coughing up gray mucous and blood.    chest congestion   UPPER RESPIRATORY TRACT INFECTION Worst symptom: 3-4 weeks and treated with a zpak and symptoms haven't improved.  Having bladder leakage due to cough.  Fever: no Cough: yes Shortness of breath: yes Wheezing: no Chest pain: yes, with cough Chest tightness: no Chest congestion: yes Nasal congestion: yes Runny nose: no Post nasal drip: yes Sneezing: no Sore throat: yes Swollen glands: no Sinus pressure: no Headache: yes Face pain: no Toothache: no Ear pain: no bilateral Ear pressure: no bilateral Eyes red/itching:no Eye drainage/crusting: no  Vomiting: no Rash: no Fatigue: yes Sick contacts: no Strep contacts: no  Context: stable Recurrent sinusitis: no Relief with OTC cold/cough medications: no  Treatments attempted: cough syrup   Relevant past medical, surgical, family and social history reviewed and updated as indicated. Interim medical history since our last visit reviewed. Allergies and medications reviewed and updated.  Review of Systems  Constitutional:  Negative for fatigue and fever.  HENT:  Positive for congestion, postnasal drip and sore throat. Negative for dental problem, ear pain, rhinorrhea, sinus pressure, sinus pain and sneezing.   Respiratory:   Positive for cough and shortness of breath. Negative for chest tightness and wheezing.   Cardiovascular:  Positive for chest pain.  Gastrointestinal:  Negative for vomiting.  Skin:  Negative for rash.  Neurological:  Positive for headaches.    Per HPI unless specifically indicated above     Objective:    BP 115/82 (BP Location: Right Arm, Patient Position: Sitting, Cuff Size: Normal)   Pulse 82   Temp 97.6 F (36.4 C) (Oral)   Wt 165 lb 3.2 oz (74.9 kg)   LMP 04/21/2024 (Approximate)   SpO2 99%   BMI 28.36 kg/m   Wt Readings from Last 3 Encounters:  05/08/24 165 lb 3.2 oz (74.9 kg)  03/10/24 159 lb 12.8 oz (72.5 kg)  11/03/23 162 lb (73.5 kg)    Physical Exam Vitals and nursing note reviewed.  Constitutional:      General: She is not in acute distress.    Appearance: Normal appearance. She is normal weight. She is not ill-appearing, toxic-appearing or diaphoretic.  HENT:     Head: Normocephalic.     Right Ear: External ear normal. A middle ear effusion is present.     Left Ear: External ear normal. A middle ear effusion is present.     Nose: Congestion and rhinorrhea present.     Right Sinus: No maxillary sinus tenderness or frontal sinus tenderness.     Left Sinus: No maxillary sinus tenderness or frontal sinus tenderness.     Mouth/Throat:     Mouth: Mucous membranes are moist.  Pharynx: Posterior oropharyngeal erythema present. No oropharyngeal exudate.  Eyes:     General:        Right eye: No discharge.        Left eye: No discharge.     Extraocular Movements: Extraocular movements intact.     Conjunctiva/sclera: Conjunctivae normal.     Pupils: Pupils are equal, round, and reactive to light.  Cardiovascular:     Rate and Rhythm: Normal rate and regular rhythm.     Heart sounds: No murmur heard. Pulmonary:     Effort: Pulmonary effort is normal. No respiratory distress.     Breath sounds: Normal breath sounds. No wheezing or rales.  Musculoskeletal:      Cervical back: Normal range of motion and neck supple.  Skin:    General: Skin is warm and dry.     Capillary Refill: Capillary refill takes less than 2 seconds.  Neurological:     General: No focal deficit present.     Mental Status: She is alert and oriented to person, place, and time. Mental status is at baseline.  Psychiatric:        Mood and Affect: Mood normal.        Behavior: Behavior normal.        Thought Content: Thought content normal.        Judgment: Judgment normal.     Results for orders placed or performed in visit on 03/10/24  Comp Met (CMET)   Collection Time: 03/10/24 10:06 AM  Result Value Ref Range   Glucose 89 70 - 99 mg/dL   BUN 9 6 - 20 mg/dL   Creatinine, Ser 9.26 0.57 - 1.00 mg/dL   eGFR 886 >40 fO/fpw/8.26   BUN/Creatinine Ratio 12 9 - 23   Sodium 139 134 - 144 mmol/L   Potassium 4.9 3.5 - 5.2 mmol/L   Chloride 103 96 - 106 mmol/L   CO2 21 20 - 29 mmol/L   Calcium 9.6 8.7 - 10.2 mg/dL   Total Protein 6.6 6.0 - 8.5 g/dL   Albumin 4.5 3.9 - 4.9 g/dL   Globulin, Total 2.1 1.5 - 4.5 g/dL   Bilirubin Total 0.5 0.0 - 1.2 mg/dL   Alkaline Phosphatase 48 41 - 116 IU/L   AST 16 0 - 40 IU/L   ALT 13 0 - 32 IU/L  HgB A1c   Collection Time: 03/10/24 10:06 AM  Result Value Ref Range   Hgb A1c MFr Bld 4.5 (L) 4.8 - 5.6 %   Est. average glucose Bld gHb Est-mCnc 82 mg/dL      Assessment & Plan:   Problem List Items Addressed This Visit   None    Follow up plan: No follow-ups on file.

## 2024-05-26 DIAGNOSIS — J101 Influenza due to other identified influenza virus with other respiratory manifestations: Secondary | ICD-10-CM | POA: Diagnosis not present

## 2024-05-26 DIAGNOSIS — R0981 Nasal congestion: Secondary | ICD-10-CM | POA: Diagnosis not present

## 2024-05-26 DIAGNOSIS — R051 Acute cough: Secondary | ICD-10-CM | POA: Diagnosis not present

## 2024-05-26 DIAGNOSIS — R112 Nausea with vomiting, unspecified: Secondary | ICD-10-CM | POA: Diagnosis not present

## 2024-07-11 ENCOUNTER — Telehealth: Payer: Self-pay | Admitting: *Deleted

## 2024-07-11 DIAGNOSIS — F319 Bipolar disorder, unspecified: Secondary | ICD-10-CM

## 2024-07-11 NOTE — Progress Notes (Signed)
 Complex Care Management Note  Care Guide Note 07/11/2024 Name: Alyssa Kemp MRN: [id number] DOB: 10-28-1990  [name] is a 34 y.o. year old female who sees Melvin Pao, NP for primary care. I reached out to [employer] by phone today to offer complex care management services.  Ms. Cavenaugh was given information about Complex Care Management services today including:   The Complex Care Management services include support from the care team which includes your Nurse Care Manager, Clinical Social Worker, or Pharmacist.  The Complex Care Management team is here to help remove barriers to the health concerns and goals most important to you. Complex Care Management services are voluntary, and the patient may decline or stop services at any time by request to their care team member.   Complex Care Management Consent Status: Patient did not agree to participate in complex care management services at this time.    Encounter Outcome:  Patient Refused  Harlene Satterfield  [facility]  [facility], [facility] Guide  Direct Dial: [phone]  Fax [phone]

## 2024-07-11 NOTE — Progress Notes (Signed)
 Complex Care Management Note Care Guide Note  07/11/2024 Name: Clarke Amburn MRN: [id number] DOB: Jul 14, 1990   Complex Care Management Outreach Attempts: An unsuccessful telephone outreach was attempted today to offer the patient information about available complex care management services.  Follow Up Plan:  Additional outreach attempts will be made to offer the patient complex care management information and services.   Encounter Outcome:  No Answer  Harlene Satterfield  [facility]  [facility], [facility] Guide  Direct Dial: [phone]  Fax [phone]

## 2024-07-27 ENCOUNTER — Encounter: Payer: Medicaid Other | Admitting: Nurse Practitioner

## 2024-08-09 ENCOUNTER — Encounter: Admitting: Nurse Practitioner
